# Patient Record
Sex: Female | Born: 1955
Health system: Southern US, Community
[De-identification: ages and names within clinical notes are randomized; demographics above are authoritative.]

## PROBLEM LIST (undated history)

## (undated) DIAGNOSIS — Z9109 Other allergy status, other than to drugs and biological substances: Secondary | ICD-10-CM

## (undated) DIAGNOSIS — K219 Gastro-esophageal reflux disease without esophagitis: Secondary | ICD-10-CM

## (undated) DIAGNOSIS — C50919 Malignant neoplasm of unspecified site of unspecified female breast: Secondary | ICD-10-CM

## (undated) DIAGNOSIS — E785 Hyperlipidemia, unspecified: Secondary | ICD-10-CM

## (undated) DIAGNOSIS — M459 Ankylosing spondylitis of unspecified sites in spine: Secondary | ICD-10-CM

## (undated) DIAGNOSIS — I10 Essential (primary) hypertension: Secondary | ICD-10-CM

## (undated) DIAGNOSIS — M1990 Unspecified osteoarthritis, unspecified site: Secondary | ICD-10-CM

## (undated) DIAGNOSIS — G5600 Carpal tunnel syndrome, unspecified upper limb: Secondary | ICD-10-CM

## (undated) HISTORY — DX: Malignant neoplasm of unspecified site of unspecified female breast: C50.919

## (undated) HISTORY — PX: ABDOMINAL HYSTERECTOMY: SHX81

## (undated) HISTORY — DX: Ankylosing spondylitis of unspecified sites in spine: M45.9

---

## 2001-08-10 ENCOUNTER — Encounter: Payer: Self-pay | Admitting: Internal Medicine

## 2001-08-10 ENCOUNTER — Emergency Department (HOSPITAL_COMMUNITY): Admission: EM | Admit: 2001-08-10 | Discharge: 2001-08-10 | Payer: Self-pay | Admitting: Internal Medicine

## 2002-02-21 ENCOUNTER — Emergency Department (HOSPITAL_COMMUNITY): Admission: EM | Admit: 2002-02-21 | Discharge: 2002-02-21 | Payer: Self-pay | Admitting: *Deleted

## 2007-01-28 ENCOUNTER — Ambulatory Visit (HOSPITAL_COMMUNITY): Admission: RE | Admit: 2007-01-28 | Discharge: 2007-01-28 | Payer: Self-pay | Admitting: Internal Medicine

## 2007-03-05 ENCOUNTER — Ambulatory Visit (HOSPITAL_COMMUNITY): Admission: RE | Admit: 2007-03-05 | Discharge: 2007-03-05 | Payer: Self-pay | Admitting: Emergency Medicine

## 2007-09-01 ENCOUNTER — Encounter (HOSPITAL_COMMUNITY): Admission: RE | Admit: 2007-09-01 | Discharge: 2007-10-01 | Payer: Self-pay | Admitting: Internal Medicine

## 2008-05-18 ENCOUNTER — Ambulatory Visit (HOSPITAL_COMMUNITY): Admission: RE | Admit: 2008-05-18 | Discharge: 2008-05-18 | Payer: Self-pay | Admitting: Internal Medicine

## 2010-03-22 ENCOUNTER — Ambulatory Visit (HOSPITAL_COMMUNITY): Admission: RE | Admit: 2010-03-22 | Discharge: 2010-03-22 | Payer: Self-pay | Admitting: Internal Medicine

## 2010-04-12 ENCOUNTER — Encounter (HOSPITAL_COMMUNITY): Admission: RE | Admit: 2010-04-12 | Discharge: 2010-05-12 | Payer: Self-pay | Admitting: Neurology

## 2010-09-25 ENCOUNTER — Ambulatory Visit (HOSPITAL_COMMUNITY): Admission: RE | Admit: 2010-09-25 | Discharge: 2010-09-25 | Payer: Self-pay | Admitting: Gastroenterology

## 2011-06-25 ENCOUNTER — Other Ambulatory Visit: Payer: Self-pay | Admitting: Obstetrics and Gynecology

## 2011-06-25 DIAGNOSIS — R209 Unspecified disturbances of skin sensation: Secondary | ICD-10-CM

## 2011-06-28 ENCOUNTER — Ambulatory Visit (HOSPITAL_COMMUNITY)
Admission: RE | Admit: 2011-06-28 | Discharge: 2011-06-28 | Disposition: A | Discharge status: Home / Self Care | Payer: Self-pay | Source: Ambulatory Visit | Attending: Obstetrics and Gynecology | Admitting: Obstetrics and Gynecology

## 2011-06-28 DIAGNOSIS — R209 Unspecified disturbances of skin sensation: Secondary | ICD-10-CM | POA: Insufficient documentation

## 2011-06-28 DIAGNOSIS — G9389 Other specified disorders of brain: Secondary | ICD-10-CM | POA: Insufficient documentation

## 2011-11-21 ENCOUNTER — Other Ambulatory Visit (HOSPITAL_COMMUNITY): Payer: Self-pay | Admitting: Physician Assistant

## 2011-11-21 DIAGNOSIS — Z1231 Encounter for screening mammogram for malignant neoplasm of breast: Secondary | ICD-10-CM

## 2011-11-22 ENCOUNTER — Other Ambulatory Visit: Payer: Self-pay | Admitting: Obstetrics and Gynecology

## 2011-11-22 DIAGNOSIS — Z1231 Encounter for screening mammogram for malignant neoplasm of breast: Secondary | ICD-10-CM

## 2011-11-26 ENCOUNTER — Ambulatory Visit (HOSPITAL_COMMUNITY)
Admission: RE | Admit: 2011-11-26 | Discharge: 2011-11-26 | Disposition: A | Discharge status: Home / Self Care | Payer: Self-pay | Source: Ambulatory Visit | Attending: Physician Assistant | Admitting: Physician Assistant

## 2011-11-26 DIAGNOSIS — Z1231 Encounter for screening mammogram for malignant neoplasm of breast: Secondary | ICD-10-CM

## 2012-02-28 ENCOUNTER — Emergency Department (HOSPITAL_COMMUNITY)
Admission: EM | Admit: 2012-02-28 | Discharge: 2012-02-28 | Disposition: A | Discharge status: Home / Self Care | Payer: Self-pay | Attending: Emergency Medicine | Admitting: Emergency Medicine

## 2012-02-28 ENCOUNTER — Emergency Department (HOSPITAL_COMMUNITY): Payer: Self-pay

## 2012-02-28 ENCOUNTER — Encounter (HOSPITAL_COMMUNITY): Payer: Self-pay | Admitting: *Deleted

## 2012-02-28 DIAGNOSIS — M129 Arthropathy, unspecified: Secondary | ICD-10-CM | POA: Insufficient documentation

## 2012-02-28 DIAGNOSIS — M7989 Other specified soft tissue disorders: Secondary | ICD-10-CM | POA: Insufficient documentation

## 2012-02-28 DIAGNOSIS — I10 Essential (primary) hypertension: Secondary | ICD-10-CM | POA: Insufficient documentation

## 2012-02-28 DIAGNOSIS — E785 Hyperlipidemia, unspecified: Secondary | ICD-10-CM | POA: Insufficient documentation

## 2012-02-28 DIAGNOSIS — R600 Localized edema: Secondary | ICD-10-CM

## 2012-02-28 DIAGNOSIS — M25579 Pain in unspecified ankle and joints of unspecified foot: Secondary | ICD-10-CM | POA: Insufficient documentation

## 2012-02-28 HISTORY — DX: Unspecified osteoarthritis, unspecified site: M19.90

## 2012-02-28 HISTORY — DX: Hyperlipidemia, unspecified: E78.5

## 2012-02-28 HISTORY — DX: Other allergy status, other than to drugs and biological substances: Z91.09

## 2012-02-28 HISTORY — DX: Essential (primary) hypertension: I10

## 2012-02-28 LAB — URINE MICROSCOPIC-ADD ON

## 2012-02-28 LAB — BASIC METABOLIC PANEL
BUN: 7 mg/dL (ref 6–23)
CO2: 33 mEq/L — ABNORMAL HIGH (ref 19–32)
Calcium: 10 mg/dL (ref 8.4–10.5)
Chloride: 103 mEq/L (ref 96–112)
Creatinine, Ser: 0.67 mg/dL (ref 0.50–1.10)
GFR calc Af Amer: >90 mL/min (ref >90)
GFR calc non Af Amer: >90 mL/min (ref >90)
Glucose, Bld: 109 mg/dL — ABNORMAL HIGH (ref 70–99)
Potassium: 3.2 mEq/L — ABNORMAL LOW (ref 3.5–5.1)
Sodium: 142 mEq/L (ref 135–145)

## 2012-02-28 LAB — URINALYSIS, ROUTINE W REFLEX MICROSCOPIC
Bilirubin Urine: NEGATIVE
Glucose, UA: NEGATIVE mg/dL
Hgb urine dipstick: NEGATIVE
Ketones, ur: NEGATIVE mg/dL
Nitrite: NEGATIVE
Protein, ur: NEGATIVE mg/dL
Specific Gravity, Urine: <1.005 — ABNORMAL LOW (ref 1.005–1.030)
Urobilinogen, UA: 0.2 mg/dL (ref 0.0–1.0)
pH: 5.5 (ref 5.0–8.0)

## 2012-02-28 NOTE — Discharge Instructions (Signed)
Peripheral Edema You have swelling in your legs (peripheral edema). This swelling is due to excess accumulation of salt and water in your body. Edema may be a sign of heart, kidney or liver disease, or a side effect of a medication. It may also be due to problems in the leg veins. Elevating your legs and using special support stockings may be very helpful, if the cause of the swelling is due to poor venous circulation. Avoid long periods of standing, whatever the cause. Treatment of edema depends on identifying the cause. Chips, pretzels, pickles and other salty foods should be avoided. Restricting salt in your diet is almost always needed. Water pills (diuretics) are often used to remove the excess salt and water from your body via urine. These medicines prevent the kidney from reabsorbing sodium. This increases urine flow. Diuretic treatment may also result in lowering of potassium levels in your body. Potassium supplements may be needed if you have to use diuretics daily. Daily weights can help you keep track of your progress in clearing your edema. You should call your caregiver for follow up care as recommended. SEEK IMMEDIATE MEDICAL CARE IF:   You have increased swelling, pain, redness, or heat in your legs.   You develop shortness of breath, especially when lying down.   You develop chest or abdominal pain, weakness, or fainting.   You have a fever.  Document Released: 12/20/2004 Document Revised: 11/01/2011 Document Reviewed: 11/30/2009 [employer] Patient Information 2012 [employer]. 

## 2012-02-28 NOTE — ED Provider Notes (Signed)
History   This chart was scribed for Felisa Bonier, MD by Sofie Rower. The patient was seen in room APA01/APA01 and the patient's care was started at 4:09 PM     CSN: [id number]  Arrival date & time 02/28/12  1441   First MD Initiated Contact with Patient 02/28/12 1517      Chief Complaint  Patient presents with  . Leg Swelling    (Consider location/radiation/quality/duration/timing/severity/associated sxs/prior treatment) HPI  Kimberly Roman is a 56 y.o. female who presents to the Emergency Department complaining of moderate, episodic edema located bilaterally at the ankles  onset two weeks ago without significant associated symptoms of ankle pain. No history of trauma. Modifying factors include accuretic (ace inhibitor and hydrochlorothiazide) for which the pt was recently prescribed and increased in dose. Pt has a hx of arthritis in the spine but no other known arthritis.  Pt denies hx of edema of the ankles, gout.      Past Medical History  Diagnosis Date  . Hypertension   . Hyperlipemia   . Environmental allergies   . Arthritis     Past Surgical History  Procedure Date  . Abdominal hysterectomy       History  Substance Use Topics  . Smoking status: Never Smoker   . Smokeless tobacco: Not on file  . Alcohol Use: No    OB History    Grav Para Term Preterm Abortions TAB SAB Ect Mult Living                  Review of Systems  All other systems reviewed and are negative.    10 Systems reviewed and all are negative for acute change except as noted in the HPI.    Allergies  Lipitor  Home Medications   Current Outpatient Rx  Name Route Sig Dispense Refill  . AMLODIPINE BESYLATE 10 MG PO TABS Oral Take 10 mg by mouth at bedtime.    Marland Kitchen VITAMIN D 1000 UNITS PO TABS Oral Take 1,000 Units by mouth 2 (two) times daily.    . COLESEVELAM HCL 625 MG PO TABS Oral Take 1,875 mg by mouth 2 (two) times daily.    . DULOXETINE HCL 60 MG PO CPEP Oral Take 60  mg by mouth at bedtime.    . OMEGA-3 FATTY ACIDS 1000 MG PO CAPS Oral Take 1 g by mouth 3 (three) times daily.    Marland Kitchen GABAPENTIN 600 MG PO TABS Oral Take 600 mg by mouth 3 (three) times daily.    Marland Kitchen LORATADINE 10 MG PO TABS Oral Take 10 mg by mouth at bedtime.    Marland Kitchen MELATONIN 3 MG PO CAPS Oral Take 1 capsule by mouth at bedtime.    . QUINAPRIL-HYDROCHLOROTHIAZIDE 20-12.5 MG PO TABS Oral Take 1 tablet by mouth daily.      BP 146/72  Pulse 87  Temp(Src) 98 F (36.7 C) (Oral)  Ht 5\' 5"  (1.651 m)  Wt 170 lb (77.111 kg)  BMI 28.29 kg/m2  SpO2 98%  Physical Exam  Nursing note and vitals reviewed. Constitutional: She is oriented to person, place, and time. She appears well-developed and well-nourished.  HENT:  Head: Normocephalic and atraumatic.  Eyes: Conjunctivae and EOM are normal. Pupils are equal, round, and reactive to light. No scleral icterus.  Neck: Neck supple. No JVD present. No thyromegaly present.  Cardiovascular: Normal rate and regular rhythm.  Exam reveals no gallop and no friction rub.   No murmur heard. Pulmonary/Chest: Breath  sounds normal. No stridor. She has no wheezes. She has no rales. She exhibits no tenderness.       No respiratory distress, or shortness of breath reported.   Abdominal: She exhibits no distension. There is no tenderness. There is no rebound.  Musculoskeletal: Normal range of motion. She exhibits edema (Bilaterally at the ankles, medial maleousus on each side.). She exhibits no tenderness.       Normal flexion and strength, 5/5, intact extension of the great toes.   Lymphadenopathy:    She has no cervical adenopathy.  Neurological: She is alert and oriented to person, place, and time. No cranial nerve deficit. Coordination normal.  Skin: Skin is warm and dry. No rash noted. No erythema (mild. ).       Bogginess bilaterally over the left malleolus without tenderness or bruising.   Psychiatric: She has a normal mood and affect. Her behavior is normal.      ED Course  Procedures (including critical care time)  DIAGNOSTIC STUDIES: Oxygen Saturation is 98% on room air, normal by my interpretation.    COORDINATION OF CARE:     Labs Reviewed  BASIC METABOLIC PANEL - Abnormal; Notable for the following:    Potassium 3.2 (*)    CO2 33 (*)    Glucose, Bld 109 (*)    All other components within normal limits  URINALYSIS, ROUTINE W REFLEX MICROSCOPIC   Dg Ankle Complete Left  02/28/2012  *RADIOLOGY REPORT*  Clinical Data: Swelling at lateral malleolus  LEFT ANKLE COMPLETE - 3+ VIEW  Comparison: None  Findings: Mild diffuse soft tissue swelling.  No fracture or subluxation.  No significant arthropathy or other bony abnormality.  Small plantar and posterior calcaneal heel spurs are noted.  IMPRESSION:  1. Soft tissue swelling. 2.  Heel spurs.  Original Report Authenticated By: Rosealee Albee, M.D.   Dg Ankle Complete Right  02/28/2012  *RADIOLOGY REPORT*  Clinical Data: Bilateral ankle pain and swelling  RIGHT ANKLE - COMPLETE 3+ VIEW  Comparison: None  Findings: Mild diffuse soft tissue swelling is identified.  There is no fracture or subluxation identified.  Small posterior and plantar heel spurs identified.  No radiopaque foreign bodies or soft tissue calcifications.  IMPRESSION:  1.  Mild soft tissue swelling. 2.  Heel spurs.  Original Report Authenticated By: Rosealee Albee, M.D.     No diagnosis found.  4:16PM- EDP at bedside discusses treatment plan.   MDM  Considerations include acute renal failure, nephrotic syndrome, venous insufficiency, but the patient does not appear to have deep venous thrombosis, cellulitis, fracture, acute or chronic arthritis of the ankles, or congestive heart failure based on history, physical examination, and review of today's x-rays. Likely, the patient is has asymptomatic edema of the lower extremities but will need to be treated with compression stockings and elevation, and followup with her primary  prescribing physician for further evaluation and management. No apparent acute emergency cause of edema.      I personally performed the services described in this documentation, which was scribed in my presence. The recorded information has been reviewed and considered.   Felisa Bonier, MD 02/28/12 [phone]

## 2012-02-28 NOTE — ED Notes (Signed)
Bil swelling of feet and ankles, No injury.

## 2012-02-28 NOTE — ED Notes (Signed)
Swelling to bilateral ankles, worse on the right, throbbing pain, pt states that she has had this problem since march has been seen by the "clinic" with no diagnosis. Pt states that the pain has become "too much" for her, denies any injury. Pt states that the swelling states around both ankles.

## 2012-08-19 ENCOUNTER — Emergency Department (HOSPITAL_COMMUNITY): Payer: Self-pay

## 2012-08-19 ENCOUNTER — Emergency Department (HOSPITAL_COMMUNITY)
Admission: EM | Admit: 2012-08-19 | Discharge: 2012-08-19 | Disposition: A | Discharge status: Home / Self Care | Payer: Self-pay | Attending: Emergency Medicine | Admitting: Emergency Medicine

## 2012-08-19 ENCOUNTER — Encounter (HOSPITAL_COMMUNITY): Payer: Self-pay

## 2012-08-19 DIAGNOSIS — X58XXXA Exposure to other specified factors, initial encounter: Secondary | ICD-10-CM | POA: Insufficient documentation

## 2012-08-19 DIAGNOSIS — S8010XA Contusion of unspecified lower leg, initial encounter: Secondary | ICD-10-CM | POA: Insufficient documentation

## 2012-08-19 DIAGNOSIS — Z888 Allergy status to other drugs, medicaments and biological substances status: Secondary | ICD-10-CM | POA: Insufficient documentation

## 2012-08-19 DIAGNOSIS — I10 Essential (primary) hypertension: Secondary | ICD-10-CM | POA: Insufficient documentation

## 2012-08-19 NOTE — ED Provider Notes (Signed)
History     CSN: [id number]  Arrival date & time 08/19/12  1601   First MD Initiated Contact with Patient 08/19/12 1648      Chief Complaint  Patient presents with  . Leg Pain    (Consider location/radiation/quality/duration/timing/severity/associated sxs/prior treatment) Patient is a 56 y.o. female presenting with leg pain. The history is provided by the patient.  Leg Pain  Incident onset: Pt noticeed raised area of the upper "shin" 2 days ago. No sure of the time of the injury. The incident occurred at work. The injury mechanism is unknown. The pain is present in the left leg. The quality of the pain is described as aching. The pain is moderate. The pain has been fluctuating since onset. Pertinent negatives include no numbness, no inability to bear weight and no loss of sensation. She reports no foreign bodies present. The symptoms are aggravated by bearing weight.    Past Medical History  Diagnosis Date  . Hypertension   . Hyperlipemia   . Environmental allergies   . Arthritis     Past Surgical History  Procedure Date  . Abdominal hysterectomy     No family history on file.  History  Substance Use Topics  . Smoking status: Never Smoker   . Smokeless tobacco: Not on file  . Alcohol Use: No    OB History    Grav Para Term Preterm Abortions TAB SAB Ect Mult Living                  Review of Systems  Constitutional: Negative for activity change.       All ROS Neg except as noted in HPI  HENT: Positive for sneezing. Negative for nosebleeds and neck pain.   Eyes: Negative for photophobia and discharge.  Respiratory: Negative for cough, shortness of breath and wheezing.   Cardiovascular: Negative for chest pain and palpitations.  Gastrointestinal: Negative for abdominal pain and blood in stool.  Genitourinary: Negative for dysuria, frequency and hematuria.  Musculoskeletal: Positive for arthralgias. Negative for back pain.  Skin: Negative.   Neurological:  Negative for dizziness, seizures, speech difficulty and numbness.  Psychiatric/Behavioral: Negative for hallucinations and confusion.    Allergies  Lipitor  Home Medications   Current Outpatient Rx  Name Route Sig Dispense Refill  . AMLODIPINE BESYLATE 10 MG PO TABS Oral Take 10 mg by mouth at bedtime.    . CELECOXIB 200 MG PO CAPS Oral Take 200 mg by mouth daily.    Marland Kitchen VITAMIN D 1000 UNITS PO TABS Oral Take 1,000 Units by mouth daily.     . COLESEVELAM HCL 625 MG PO TABS Oral Take 1,250 mg by mouth 2 (two) times daily.     . DULOXETINE HCL 60 MG PO CPEP Oral Take 60 mg by mouth at bedtime.    . OMEGA-3 FATTY ACIDS 1000 MG PO CAPS Oral Take 1 g by mouth 3 (three) times daily.    Marland Kitchen GABAPENTIN 600 MG PO TABS Oral Take 600 mg by mouth 3 (three) times daily.    Marland Kitchen MELATONIN 3 MG PO CAPS Oral Take 1 capsule by mouth at bedtime.      BP 131/69  Pulse 95  Temp 98.1 F (36.7 C) (Oral)  Resp 20  Ht 5\' 5"  (1.651 m)  Wt 163 lb (73.936 kg)  BMI 27.12 kg/m2  SpO2 97%  Physical Exam  Nursing note and vitals reviewed. Constitutional: She is oriented to person, place, and time. She appears well-developed and  well-nourished.  Non-toxic appearance.  HENT:  Head: Normocephalic.  Right Ear: Tympanic membrane and external ear normal.  Left Ear: Tympanic membrane and external ear normal.  Eyes: EOM and lids are normal. Pupils are equal, round, and reactive to light.  Neck: Normal range of motion. Neck supple. Carotid bruit is not present.  Cardiovascular: Normal rate, regular rhythm, normal heart sounds, intact distal pulses and normal pulses.   Pulmonary/Chest: Breath sounds normal. No respiratory distress.  Abdominal: Soft. Bowel sounds are normal. There is no tenderness. There is no guarding.  Musculoskeletal: Normal range of motion.       Quarter size hematoma over the upper left tibia. Mild to mod soreness. Not hot. No red streaking. No posterior mass. Neg Homan's sign. DP and PT pulse and  sensory symmetrical.  Lymphadenopathy:       Head (right side): No submandibular adenopathy present.       Head (left side): No submandibular adenopathy present.    She has no cervical adenopathy.  Neurological: She is alert and oriented to person, place, and time. She has normal strength. No cranial nerve deficit or sensory deficit.  Skin: Skin is warm and dry.  Psychiatric: She has a normal mood and affect. Her speech is normal.    ED Course  Procedures (including critical care time)  Labs Reviewed - No data to display No results found. Pulse Ox 97% on RA. WNL by my interpretation.  No diagnosis found.    MDM  I have reviewed nursing notes, vital signs, and all appropriate lab and imaging results for this patient. Xray of the left Tib/Fib is negative for fracture or fb. Exam is consistent with anterior tibial hematoma. Pt re-assured with results. She is to return if any changes or problem.       Kathie Dike, [place] 08/19/12 [phone]

## 2012-08-19 NOTE — ED Notes (Signed)
Pt has a swollen area over upper tibia, present since Sunday. No trauma known.  Area hurts when walks. No discoloration. Area was bigger at onset.

## 2012-08-19 NOTE — ED Notes (Signed)
Pt reports has tender raised knot on left leg beside shin.  Pt says doesn't remember injuring her leg.

## 2012-08-20 NOTE — ED Provider Notes (Signed)
Medical screening examination/treatment/procedure(s) were performed by non-physician practitioner and as supervising physician I was immediately available for consultation/collaboration.  Donnetta Hutching, MD 08/20/12 [phone]

## 2012-12-31 ENCOUNTER — Other Ambulatory Visit (HOSPITAL_COMMUNITY): Payer: Self-pay | Admitting: Internal Medicine

## 2012-12-31 DIAGNOSIS — Z139 Encounter for screening, unspecified: Secondary | ICD-10-CM

## 2013-01-06 ENCOUNTER — Other Ambulatory Visit: Payer: Self-pay

## 2013-01-06 ENCOUNTER — Telehealth: Payer: Self-pay

## 2013-01-06 DIAGNOSIS — Z1211 Encounter for screening for malignant neoplasm of colon: Secondary | ICD-10-CM

## 2013-01-06 MED ORDER — PEG-KCL-NACL-NASULF-NA ASC-C 100 G PO SOLR: 1.0000 | ORAL | Status: DC

## 2013-01-06 NOTE — Telephone Encounter (Signed)
Rx sent to [facility]. Instructions mailed to pt.  

## 2013-01-06 NOTE — Telephone Encounter (Signed)
Gastroenterology Pre-Procedure Form     Request Date: 01/06/2013                     Requesting Physician: Dr. Felecia Shelling     PATIENT INFORMATION:  Kimberly Roman is a 57 y.o., female (DOB=[birth year]).  PROCEDURE: Procedure(s) requested: colonoscopy Procedure Reason: screening for colon cancer  PATIENT REVIEW QUESTIONS: The patient reports the following:   1. Diabetes Melitis: no 2. Joint replacements in the past 12 months: no 3. Major health problems in the past 3 months: no 4. Has an artificial valve or MVP:no 5. Has been advised in past to take antibiotics in advance of a procedure like teeth cleaning: no}    MEDICATIONS & ALLERGIES:    Patient reports the following regarding taking any blood thinners:   Plavix? no Aspirin?no Coumadin?  no  Patient confirms/reports the following medications:  Current Outpatient Prescriptions  Medication Sig Dispense Refill  . amLODipine (NORVASC) 10 MG tablet Take 10 mg by mouth at bedtime.      . celecoxib (CELEBREX) 200 MG capsule Take 200 mg by mouth daily.      . cholecalciferol (VITAMIN D) 1000 UNITS tablet Take 1,000 Units by mouth daily.       . colesevelam (WELCHOL) 625 MG tablet Take 1,250 mg by mouth 2 (two) times daily.       . DULoxetine (CYMBALTA) 60 MG capsule Take 60 mg by mouth at bedtime.      . fish oil-omega-3 fatty acids 1000 MG capsule Take 1 g by mouth 3 (three) times daily.      Marland Kitchen gabapentin (NEURONTIN) 600 MG tablet Take 600 mg by mouth 3 (three) times daily.      . Melatonin 3 MG CAPS Take 1 capsule by mouth at bedtime.       No current facility-administered medications for this visit.    Patient confirms/reports the following allergies:  Allergies  Allergen Reactions  . Lipitor (Atorvastatin Calcium) Other (See Comments)    REACTION: Cramping in legs/lower extremties    Patient is appropriate to schedule for requested procedure(s): yes  AUTHORIZATION INFORMATION Primary Insurance:   ID #:   Group #:   Pre-Cert / Auth required: Pre-Cert / Auth #:   Secondary Insurance:   ID #:  Group #:  Pre-Cert / Auth required:  Pre-Cert / Auth #:   No orders of the defined types were placed in this encounter.    SCHEDULE INFORMATION: Procedure has been scheduled as follows:  Date: 01/20/2013     Time: 9:30 AM  Location: [facility] Short Stay  This Gastroenterology Pre-Precedure Form is being routed to the following provider(s) for review: Jonette Eva, MD

## 2013-01-06 NOTE — Telephone Encounter (Signed)
MOVI PREP SPLIT DOSING, REGULAR BREAKFAST. CLEAR LIQUIDS AFTER 9 AM.  

## 2013-01-08 ENCOUNTER — Ambulatory Visit (HOSPITAL_COMMUNITY): Payer: Self-pay

## 2013-01-12 ENCOUNTER — Encounter (HOSPITAL_COMMUNITY): Payer: Self-pay | Admitting: Pharmacy Technician

## 2013-01-19 MED ORDER — SODIUM CHLORIDE 0.45 % IV SOLN
INTRAVENOUS | Status: DC
  Administered 2013-01-20: 10:00:00 via INTRAVENOUS

## 2013-01-20 ENCOUNTER — Ambulatory Visit (HOSPITAL_COMMUNITY)
Admission: RE | Admit: 2013-01-20 | Discharge: 2013-01-20 | Disposition: A | Discharge status: Home / Self Care | Payer: BC Managed Care – PPO | Source: Ambulatory Visit | Attending: Gastroenterology | Admitting: Gastroenterology

## 2013-01-20 ENCOUNTER — Encounter (HOSPITAL_COMMUNITY)
Admission: RE | Disposition: A | Discharge status: Home / Self Care | Payer: Self-pay | Source: Ambulatory Visit | Attending: Gastroenterology

## 2013-01-20 ENCOUNTER — Encounter (HOSPITAL_COMMUNITY): Payer: Self-pay | Admitting: *Deleted

## 2013-01-20 DIAGNOSIS — D126 Benign neoplasm of colon, unspecified: Secondary | ICD-10-CM

## 2013-01-20 DIAGNOSIS — D128 Benign neoplasm of rectum: Secondary | ICD-10-CM | POA: Insufficient documentation

## 2013-01-20 DIAGNOSIS — K5730 Diverticulosis of large intestine without perforation or abscess without bleeding: Secondary | ICD-10-CM

## 2013-01-20 DIAGNOSIS — I10 Essential (primary) hypertension: Secondary | ICD-10-CM | POA: Insufficient documentation

## 2013-01-20 DIAGNOSIS — Z1211 Encounter for screening for malignant neoplasm of colon: Secondary | ICD-10-CM

## 2013-01-20 DIAGNOSIS — D129 Benign neoplasm of anus and anal canal: Secondary | ICD-10-CM | POA: Insufficient documentation

## 2013-01-20 HISTORY — DX: Carpal tunnel syndrome, unspecified upper limb: G56.00

## 2013-01-20 HISTORY — PX: COLONOSCOPY: SHX5424

## 2013-01-20 SURGERY — COLONOSCOPY
Anesthesia: Moderate Sedation

## 2013-01-20 MED ORDER — MIDAZOLAM HCL 5 MG/5ML IJ SOLN
INTRAMUSCULAR | Status: DC | PRN
  Administered 2013-01-20: 1 mg via INTRAVENOUS
  Administered 2013-01-20 (×2): 2 mg via INTRAVENOUS

## 2013-01-20 MED ORDER — MEPERIDINE HCL 100 MG/ML IJ SOLN
INTRAMUSCULAR | Status: DC | PRN
  Administered 2013-01-20: 50 mg via INTRAVENOUS
  Administered 2013-01-20: 25 mg via INTRAVENOUS

## 2013-01-20 MED ORDER — MIDAZOLAM HCL 5 MG/5ML IJ SOLN
INTRAMUSCULAR | Status: AC
  Filled 2013-01-20: qty 10

## 2013-01-20 MED ORDER — MEPERIDINE HCL 100 MG/ML IJ SOLN
INTRAMUSCULAR | Status: AC
  Filled 2013-01-20: qty 2

## 2013-01-20 MED ORDER — STERILE WATER FOR IRRIGATION IR SOLN
Status: DC | PRN
  Administered 2013-01-20: 10:00:00

## 2013-01-20 NOTE — Op Note (Signed)
[facility] [address] [place], [address]   COLONOSCOPY PROCEDURE REPORT  PATIENT: Kimberly, Roman  MR#: [id number] BIRTHDATE: 12-20-1955 , 57  yrs. old GENDER: Female ENDOSCOPIST: Jonette Eva, MD REFERRED [id number] Fanta, M.D. PROCEDURE DATE:  01/20/2013 PROCEDURE:   Colonoscopy with cold biopsy polypectomy INDICATIONS:Average risk patient for colon cancer. MEDICATIONS: Demerol 75 mg IV and Versed 5 mg IV  DESCRIPTION OF PROCEDURE:    Physical exam was performed.  Informed consent was obtained from the patient after explaining the benefits, risks, and alternatives to procedure.  The patient was connected to monitor and placed in left lateral position. Continuous oxygen was provided by nasal cannula and IV medicine administered through an indwelling cannula.  After administration of sedation and rectal exam, the patients rectum was intubated and the Pentax Colonoscope [phone]  colonoscope was advanced under direct visualization to the cecum.  The scope was removed slowly by carefully examining the color, texture, anatomy, and integrity mucosa on the way out.  The patient was recovered in endoscopy and discharged home in satisfactory condition.    COLON FINDINGS: Two sessile polyps measuring 3 mm in size were found in the rectum and at the cecum.  A polypectomy was performed with cold forceps.  , Mild diverticulosis was noted in the sigmoid colon. REDUNDANT SIGMOID COLON, Manual abdominal counter-pressure was used to reach the cecum.  PREP QUALITY: good. CECAL W/D TIME: 16 minutes COMPLICATIONS: None  ENDOSCOPIC IMPRESSION: 1.   Two COLON polyps 2.   Mild diverticulosis was noted in the sigmoid colon  RECOMMENDATIONS: AWAIT BIOPSY HIGH FIBER DIET TCS IN 10 YEARS WITH OVERTUBE       _______________________________ eSignedJonette Eva, MD 01/20/2013 12:02 PM     PATIENT NAME:  Kimberly, Roman MR#: [id number]

## 2013-01-20 NOTE — H&P (Signed)
  Primary Care Physician:  Avon Gully, MD Primary Gastroenterologist:  Dr. Darrick Penna  Pre-Procedure History & Physical: HPI:  Kimberly Roman is a 57 y.o. female here for COLON CANCER SCREENING.  Past Medical History  Diagnosis Date  . Hypertension   . Hyperlipemia   . Environmental allergies   . Arthritis   . Carpal tunnel syndrome     Past Surgical History  Procedure Laterality Date  . Abdominal hysterectomy      Prior to Admission medications   Medication Sig Start Date End Date Taking? Authorizing Provider  amLODipine (NORVASC) 10 MG tablet Take 10 mg by mouth at bedtime.   Yes Historical Provider, MD  celecoxib (CELEBREX) 200 MG capsule Take 200 mg by mouth daily.   Yes Historical Provider, MD  cholecalciferol (VITAMIN D) 1000 UNITS tablet Take 1,000 Units by mouth daily.    Yes Historical Provider, MD  colesevelam (WELCHOL) 625 MG tablet Take 1,250 mg by mouth 2 (two) times daily.    Yes Historical Provider, MD  DULoxetine (CYMBALTA) 60 MG capsule Take 60 mg by mouth at bedtime.   Yes Historical Provider, MD  fish oil-omega-3 fatty acids 1000 MG capsule Take 1 g by mouth 3 (three) times daily.   Yes Historical Provider, MD  gabapentin (NEURONTIN) 600 MG tablet Take 600 mg by mouth 3 (three) times daily.   Yes Historical Provider, MD  Melatonin 3 MG CAPS Take 1 capsule by mouth at bedtime.   Yes Historical Provider, MD  peg 3350 powder (MOVIPREP) 100 G SOLR Take 1 kit (100 g total) by mouth as directed. 01/06/13  Yes West Bali, MD    Allergies as of 01/06/2013 - Review Complete 01/06/2013  Allergen Reaction Noted  . Lipitor (atorvastatin calcium) Other (See Comments) 02/28/2012    Family History  Problem Relation Age of Onset  . Colon cancer Neg Hx     History   Social History  . Marital Status: Single    Spouse Name: N/A    Number of Children: N/A  . Years of Education: N/A   Occupational History  . Not on file.   Social History Main Topics  .  Smoking status: Never Smoker   . Smokeless tobacco: Not on file  . Alcohol Use: No  . Drug Use: No  . Sexually Active: Not on file   Other Topics Concern  . Not on file   Social History Narrative  . No narrative on file    Review of Systems: See HPI, otherwise negative ROS   Physical Exam: BP 135/75  Temp(Src) 97.9 F (36.6 C) (Oral)  Resp 16  Ht 5\' 5"  (1.651 m)  Wt 173 lb (78.472 kg)  BMI 28.79 kg/m2  SpO2 93% General:   Alert,  pleasant and cooperative in NAD Head:  Normocephalic and atraumatic. Neck:  Supple; Lungs:  Clear throughout to auscultation.    Heart:  Regular rate and rhythm. Abdomen:  Soft, nontender and nondistended. Normal bowel sounds, without guarding, and without rebound.   Neurologic:  Alert and  oriented x4;  grossly normal neurologically.  Impression/Plan:     SCREENING  Plan:  1. TCS TODAY

## 2013-01-22 ENCOUNTER — Encounter (HOSPITAL_COMMUNITY): Payer: Self-pay | Admitting: Gastroenterology

## 2013-01-22 ENCOUNTER — Telehealth: Payer: Self-pay | Admitting: Gastroenterology

## 2013-01-22 NOTE — Telephone Encounter (Signed)
LMOM to call.

## 2013-01-22 NOTE — Telephone Encounter (Signed)
Path faxed to PCP, recall made 

## 2013-01-22 NOTE — Telephone Encounter (Signed)
Pt returned call and was informed.  

## 2013-01-22 NOTE — Telephone Encounter (Signed)
Please call pt. She had HYPERPLASTIC POLYPS removed from her colon. High fiber diet. TCS in 10 years.

## 2013-02-04 ENCOUNTER — Encounter: Payer: Self-pay | Admitting: Orthopedic Surgery

## 2013-02-04 ENCOUNTER — Ambulatory Visit (INDEPENDENT_AMBULATORY_CARE_PROVIDER_SITE_OTHER): Payer: BC Managed Care – PPO | Admitting: Orthopedic Surgery

## 2013-02-04 VITALS — BP 124/78 | Ht 65.0 in | Wt 166.0 lb

## 2013-02-04 DIAGNOSIS — G5600 Carpal tunnel syndrome, unspecified upper limb: Secondary | ICD-10-CM

## 2013-02-04 DIAGNOSIS — G5603 Carpal tunnel syndrome, bilateral upper limbs: Secondary | ICD-10-CM

## 2013-02-04 NOTE — Patient Instructions (Addendum)
Refer to Dr. Gerilyn Pilgrim for carpal tunnel study

## 2013-02-04 NOTE — Progress Notes (Signed)
Chief Complaint  Patient presents with  . Wrist Pain    Wrist and arm pain Referred by DR. Fanta    BP 124/78  Ht 5\' 5"  (1.651 m)  Wt 166 lb (75.297 kg)  BMI 27.62 kg/m2  The patient reports numbness and tingling in her hands for over 6 months swelling inability to perform fine motor tasks 7/10 sharp dull throbbing stabbing burning pain unrelieved by Neurontin and compression gloves.  She reports having I nerve conduction study but we've called the office of Dr. Gerilyn Pilgrim and there are no reports  Review of systems she denies history of thyroid disease or diabetes  She reports seasonal allergies and no other positive findings.  Past Medical History  Diagnosis Date  . Hypertension   . Hyperlipemia   . Environmental allergies   . Arthritis   . Carpal tunnel syndrome   . Ankylosing spondylitis     BP 124/78  Ht 5\' 5"  (1.651 m)  Wt 166 lb (75.297 kg)  BMI 27.62 kg/m2 General appearance is normal, the patient is alert and oriented x3 with normal mood and affect. Body frame small ectomorphic No obvious gross deformities but she cannot supinate the left forearm normally.  Right-hand and left-hand evaluation no swelling numbness and tingling with decreased sensation in the fingertips and fingers distally from the metacarpophalangeal creases. Full range of motion weak grip strength normal wrist stability skin intact no rash normal radial and ulnar pulses bilaterally with normal perfusion. Again decreased sensation in the hand distally from the metacarpophalangeal joint crease. Tenderness over the carpal tunnel. Crepitance at the first [facility] joint no pain  Impression most likely has carpal tunnel syndrome I would like to see I nerve conduction study. She's not interested in surgery but she has had a reasonable course of nonoperative treatment with gabapentin and wrist compression gloves  Recommend nerve conduction study to confirm carpal tunnel syndrome and then discussed with her Cam  Walker treatment options are

## 2013-02-06 ENCOUNTER — Telehealth: Payer: Self-pay | Admitting: *Deleted

## 2013-02-06 NOTE — Telephone Encounter (Signed)
Called Tiffany at Dr. Letitia Neri office and ask her to refer patient to Dr, Gerilyn Pilgrim for carpal tunnel study d/t patient is medicaid [place]. Faxed her Dr. Mort Sawyers office notes.

## 2013-02-24 ENCOUNTER — Ambulatory Visit (INDEPENDENT_AMBULATORY_CARE_PROVIDER_SITE_OTHER): Payer: BC Managed Care – PPO | Admitting: Orthopedic Surgery

## 2013-02-24 ENCOUNTER — Encounter: Payer: Self-pay | Admitting: Orthopedic Surgery

## 2013-02-24 VITALS — BP 122/78 | Ht 65.0 in | Wt 166.0 lb

## 2013-02-24 DIAGNOSIS — M7710 Lateral epicondylitis, unspecified elbow: Secondary | ICD-10-CM

## 2013-02-24 DIAGNOSIS — G5600 Carpal tunnel syndrome, unspecified upper limb: Secondary | ICD-10-CM

## 2013-02-24 DIAGNOSIS — M7712 Lateral epicondylitis, left elbow: Secondary | ICD-10-CM

## 2013-02-24 DIAGNOSIS — G5601 Carpal tunnel syndrome, right upper limb: Secondary | ICD-10-CM

## 2013-02-24 MED ORDER — VITAMIN B-6 100 MG PO TABS: 100.0000 mg | ORAL_TABLET | Freq: Every day | ORAL | Status: DC

## 2013-02-24 NOTE — Patient Instructions (Addendum)
CARPAL TUNNEL RIGHT   START VITAMIN B6 100 TWICE A DAY   TENNIS ELBOW LEFT  START TENNIS ELBOW EXERCISES,  APPLY ICE 3 X A DAY AND APPLY ASPERCREME 3 X A DAY

## 2013-02-24 NOTE — Progress Notes (Signed)
Patient ID: Kimberly Roman, female   DOB: [birth year], 56 y.o.   MRN: [id number] Chief Complaint  Patient presents with  . Follow-up    NCS results    Secondary complaint the patient now complains of left lateral elbow pain is a new problem  As far as her nerve conduction studies go she has carpal tunnel syndrome moderate on the right. She has not been treated with bracing or vitamin B6 she did take some gabapentin she did not improve. We discussed surgical option she would like to try continued nonoperative treatment she is fit fitted for brace today.  Left elbow pain new problem patient reports pain over the lateral aspect of the left elbow radiating into the left thumb and wrist. This is painful with wrist extension and elbow extension and she has lost some of her elbow extension and rotation. The pain is intermittent moderate to severe no other associated symptoms  Review of systems she reports numbness and tingling-like sensations in the left upper extremity and weakness with power grip activities  Past Medical History  Diagnosis Date  . Hypertension   . Hyperlipemia   . Environmental allergies   . Arthritis   . Carpal tunnel syndrome   . Ankylosing spondylitis      Vital signs are stable as recorded  General appearance is normal  The patient is alert and oriented x3  The patient's mood and affect are normal  Gait assessment: No abnormalities  The cardiovascular exam reveals normal pulses and temperature without edema or  swelling.  The lymphatic system is negative for palpable lymph nodes  The sensory exam positive for decreased sensation in the median nerve distribution of the right hand and altered sensation in the left upper extremity as well There are no pathologic reflexes.     Exam of the left elbow Inspection tenderness over the lateral epicondyle remaining bony prominences nontender no joint effusion Range of motion 25-125 range of motion decreased  supination normal pronation normal wrist extension normal wrist flexion Stability normal there is in valgus stability tests Strength flexion extension power normal Skin intact no rash  No diagnosis found.

## 2013-04-09 ENCOUNTER — Ambulatory Visit (INDEPENDENT_AMBULATORY_CARE_PROVIDER_SITE_OTHER): Payer: BC Managed Care – PPO | Admitting: Orthopedic Surgery

## 2013-04-09 ENCOUNTER — Encounter: Payer: Self-pay | Admitting: Orthopedic Surgery

## 2013-04-09 VITALS — BP 132/80 | Ht 65.0 in | Wt 166.0 lb

## 2013-04-09 DIAGNOSIS — G5601 Carpal tunnel syndrome, right upper limb: Secondary | ICD-10-CM

## 2013-04-09 DIAGNOSIS — G5600 Carpal tunnel syndrome, unspecified upper limb: Secondary | ICD-10-CM

## 2013-04-09 DIAGNOSIS — M7712 Lateral epicondylitis, left elbow: Secondary | ICD-10-CM

## 2013-04-09 DIAGNOSIS — M7710 Lateral epicondylitis, unspecified elbow: Secondary | ICD-10-CM

## 2013-04-09 NOTE — Addendum Note (Signed)
Addended by: Vickki Hearing on: 04/09/2013 04:23 PM   Modules accepted: Orders

## 2013-04-09 NOTE — Progress Notes (Signed)
Patient ID: Kimberly Roman, female   DOB: Sep 14, 1956, 57 y.o.   MRN: [id number] Chief Complaint  Patient presents with  . Follow-up    6 week recheck on CTS and tennis elbow.     The patient has been treated for carpal tunnel syndrome for greater than 6 weeks with vitamin B6 and wrist brace and anti-inflammatories she has not made significant improvement still has significant night pain and numbness and tingling in her right hand she is interested in carpal tunnel release.  Her left tennis elbow still symptomatic despite exercises and bracing ice therapy was also tried without relief she will try an injection today at my suggestion  Review of systems no constitutional symptoms eyes normal ENT normal no chest pain no asthma no weight change no incontinence no other joint swelling skin normal numbness and tingling described no depression denies polyphagia no bleeding tendencies no allergies  BP 132/80  Ht 5\' 5"  (1.651 m)  Wt 166 lb (75.297 kg)  BMI 27.62 kg/m2 General appearance is normal, the patient is alert and oriented x3 with normal mood and affect.  Kimberly's normally with no assistive devices. She has tenderness over the carpal tunnel positive compression test normal range of motion in the hands and wrist joint wrist joint stable weak grip strength skin normal decreased sensation but normal capillary refill and radial and ulnar pulse.  Left elbow tenderness lateral epicondyle with decreased supination normal pronation decreased extension normal flexion elbow joint is stable motor exam is normal scans intact pulses are good sensation is normal  Encounter Diagnoses  Name Primary?  . Carpal tunnel syndrome of right wrist Yes  . Backhand tennis elbow, left     Recommend injection left tennis elbow  She would to go ahead with surgery on the right carpal tunnel  Diagnosis LEFT tennis elbow Posterior diagnosis same Indications pain Anesthesia 2 chloride Procedure  details  Verbal consent was obtained for the procedure.  Time out was taken.  The skin over the LEFT elbow was prepped with alcohol.  A 25-gauge needle was advanced at the point of maximal tenderness and 3 mL of 1% lidocaine and 1 mL of 40 mg Depo-Medrol per cc was injected.  The procedure was tolerated well  Complications none.

## 2013-04-09 NOTE — Patient Instructions (Signed)
You have received a steroid shot. 15% of patients experience increased pain at the injection site with in the next 24 hours. This is best treated with ice and tylenol extra strength 2 tabs every 8 hours. If you are still having pain please call the office.    Surgery CTR right   Carpal Tunnel Release Carpal tunnel release is done to relieve the pressure on the nerves and tendons on the bottom side of your wrist.  LET YOUR CAREGIVER KNOW ABOUT:   Allergies to food or medicine.  Medicines taken, including vitamins, herbs, eyedrops, over-the-counter medicines, and creams.  Use of steroids (by mouth or creams).  Previous problems with anesthetics or numbing medicines.  History of bleeding problems or blood clots.  Previous surgery.  Other health problems, including diabetes and kidney problems.  Possibility of pregnancy, if this applies. RISKS AND COMPLICATIONS  Some problems that may happen after this procedure include:  Infection.  Damage to the nerves, arteries or tendons could occur. This would be very uncommon.  Bleeding. BEFORE THE PROCEDURE   This surgery may be done while you are asleep (general anesthetic) or may be done under a block where only your forearm and the surgical area is numb.  If the surgery is done under a block, the numbness will gradually wear off within several hours after surgery. HOME CARE INSTRUCTIONS   Have a responsible person with you for 24 hours.  Do not drive a car or use public transportation for 24 hours.  Only take over-the-counter or prescription medicines for pain, discomfort, or fever as directed by your caregiver. Take them as directed.  You may put ice on the palm side of the affected wrist.  Put ice in a plastic bag.  Place a towel between your skin and the bag.  Leave the ice on for 20 to 30 minutes, 4 times per day.  If you were given a splint to keep your wrist from bending, use it as directed. It is important to wear the  splint at night or as directed. Use the splint for as long as you have pain or numbness in your hand, arm, or wrist. This may take 1 to 2 months.  Keep your hand raised (elevated) above the level of your heart as much as possible. This keeps swelling down and helps with discomfort.  Change bandages (dressings) as directed.  Keep the wound clean and dry. SEEK MEDICAL CARE IF:   You develop pain not relieved with medications.  You develop numbness of your hand.  You develop bleeding from your surgical site.  You have an oral temperature above 102 F (38.9 C).  You develop redness or swelling of the surgical site.  You develop new, unexplained problems. SEEK IMMEDIATE MEDICAL CARE IF:   You develop a rash.  You have difficulty breathing.  You develop any reaction or side effects to medications given. Document Released: 02/02/2004 Document Revised: 02/04/2012 Document Reviewed: 09/18/2007 [facility] Patient Information 2013 [place], [place].

## 2013-04-13 ENCOUNTER — Other Ambulatory Visit (HOSPITAL_COMMUNITY): Payer: Self-pay | Admitting: Internal Medicine

## 2013-04-13 ENCOUNTER — Ambulatory Visit (HOSPITAL_COMMUNITY)
Admission: RE | Admit: 2013-04-13 | Discharge: 2013-04-13 | Disposition: A | Discharge status: Home / Self Care | Payer: BC Managed Care – PPO | Source: Ambulatory Visit | Attending: Internal Medicine | Admitting: Internal Medicine

## 2013-04-13 DIAGNOSIS — M25569 Pain in unspecified knee: Secondary | ICD-10-CM | POA: Insufficient documentation

## 2013-04-13 DIAGNOSIS — M25562 Pain in left knee: Secondary | ICD-10-CM

## 2013-04-14 ENCOUNTER — Encounter (HOSPITAL_COMMUNITY): Payer: Self-pay | Admitting: Pharmacy Technician

## 2013-04-21 ENCOUNTER — Encounter (HOSPITAL_COMMUNITY)
Admission: RE | Admit: 2013-04-21 | Discharge: 2013-04-21 | Disposition: A | Discharge status: Home / Self Care | Payer: BC Managed Care – PPO | Source: Ambulatory Visit

## 2013-04-21 ENCOUNTER — Encounter: Payer: Self-pay | Admitting: Orthopedic Surgery

## 2013-04-21 NOTE — Patient Instructions (Addendum)
Kimberly Roman  04/21/2013   Your procedure is scheduled on:   04/27/2013  Report to [facility] at  615  AM.  Call this number if you have problems the morning of surgery: [phone]   Remember:   Do not eat food or drink liquids after midnight.   Take these medicines the morning of surgery with A SIP OF WATER:  Amlodipine,gabapentin   Do not wear jewelry, make-up or nail polish.  Do not wear lotions, powders, or perfumes.   Do not shave 48 hours prior to surgery. Men may shave face and neck.  Do not bring valuables to the hospital.  Contacts, dentures or bridgework may not be worn into surgery.  Leave suitcase in the car. After surgery it may be brought to your room.  For patients admitted to the hospital, checkout time is 11:00 AM the day of discharge.   Patients discharged the day of surgery will not be allowed to drive home.  Name and phone number of your driver: family  Special Instructions: Shower using CHG 2 nights before surgery and the night before surgery.  If you shower the day of surgery use CHG.  Use special wash - you have one bottle of CHG for all showers.  You should use approximately 1/3 of the bottle for each shower.   Please read over the following fact sheets that you were given: Pain Booklet, Coughing and Deep Breathing, MRSA Information, Surgical Site Infection Prevention, Anesthesia Post-op Instructions and Care and Recovery After Surgery Carpal Tunnel Release Carpal tunnel release is done to relieve the pressure on the nerves and tendons on the bottom side of your wrist.  LET YOUR CAREGIVER KNOW ABOUT:   Allergies to food or medicine.  Medicines taken, including vitamins, herbs, eyedrops, over-the-counter medicines, and creams.  Use of steroids (by mouth or creams).  Previous problems with anesthetics or numbing medicines.  History of bleeding problems or blood clots.  Previous surgery.  Other health problems, including diabetes and kidney  problems.  Possibility of pregnancy, if this applies. RISKS AND COMPLICATIONS  Some problems that may happen after this procedure include:  Infection.  Damage to the nerves, arteries or tendons could occur. This would be very uncommon.  Bleeding. BEFORE THE PROCEDURE   This surgery may be done while you are asleep (general anesthetic) or may be done under a block where only your forearm and the surgical area is numb.  If the surgery is done under a block, the numbness will gradually wear off within several hours after surgery. HOME CARE INSTRUCTIONS   Have a responsible person with you for 24 hours.  Do not drive a car or use public transportation for 24 hours.  Only take over-the-counter or prescription medicines for pain, discomfort, or fever as directed by your caregiver. Take them as directed.  You may put ice on the palm side of the affected wrist.  Put ice in a plastic bag.  Place a towel between your skin and the bag.  Leave the ice on for 20 to 30 minutes, 4 times per day.  If you were given a splint to keep your wrist from bending, use it as directed. It is important to wear the splint at night or as directed. Use the splint for as long as you have pain or numbness in your hand, arm, or wrist. This may take 1 to 2 months.  Keep your hand raised (elevated) above the level of your heart  as much as possible. This keeps swelling down and helps with discomfort.  Change bandages (dressings) as directed.  Keep the wound clean and dry. SEEK MEDICAL CARE IF:   You develop pain not relieved with medications.  You develop numbness of your hand.  You develop bleeding from your surgical site.  You have an oral temperature above 102 F (38.9 C).  You develop redness or swelling of the surgical site.  You develop new, unexplained problems. SEEK IMMEDIATE MEDICAL CARE IF:   You develop a rash.  You have difficulty breathing.  You develop any reaction or side  effects to medications given. Document Released: 02/02/2004 Document Revised: 02/04/2012 Document Reviewed: 09/18/2007 [facility] Patient Information 2014 [place], [place]. PATIENT INSTRUCTIONS POST-ANESTHESIA  IMMEDIATELY FOLLOWING SURGERY:  Do not drive or operate machinery for the first twenty four hours after surgery.  Do not make any important decisions for twenty four hours after surgery or while taking narcotic pain medications or sedatives.  If you develop intractable nausea and vomiting or a severe headache please notify your doctor immediately.  FOLLOW-UP:  Please make an appointment with your surgeon as instructed. You do not need to follow up with anesthesia unless specifically instructed to do so.  WOUND CARE INSTRUCTIONS (if applicable):  Keep a dry clean dressing on the anesthesia/puncture wound site if there is drainage.  Once the wound has quit draining you may leave it open to air.  Generally you should leave the bandage intact for twenty four hours unless there is drainage.  If the epidural site drains for more than 36-48 hours please call the anesthesia department.  QUESTIONS?:  Please feel free to call your physician or the hospital operator if you have any questions, and they will be happy to assist you.

## 2013-04-22 ENCOUNTER — Telehealth: Payer: Self-pay | Admitting: Orthopedic Surgery

## 2013-04-22 NOTE — Patient Instructions (Addendum)
Kimberly Roman  04/22/2013   Your procedure is scheduled on:  04/27/2013  Report to [facility] at  615  AM.  Call this number if you have problems the morning of surgery: [phone]   Remember:   Do not eat food or drink liquids after midnight.   Take these medicines the morning of surgery with A SIP OF WATER: norvasc,neurontin,hudrodiuril,mobic,ultram   Do not wear jewelry, make-up or nail polish.  Do not wear lotions, powders, or perfumes.   Do not shave 48 hours prior to surgery. Men may shave face and neck.  Do not bring valuables to the hospital.  Contacts, dentures or bridgework may not be worn into surgery.  Leave suitcase in the car. After surgery it may be brought to your room.  For patients admitted to the hospital, checkout time is 11:00 AM the day of discharge.   Patients discharged the day of surgery will not be allowed to drive  home.  Name and phone number of your driver: family  Special Instructions: Shower using CHG 2 nights before surgery and the night before surgery.  If you shower the day of surgery use CHG.  Use special wash - you have one bottle of CHG for all showers.  You should use approximately 1/3 of the bottle for each shower.   Please read over the following fact sheets that you were given: Pain Booklet, Coughing and Deep Breathing, MRSA Information, Surgical Site Infection Prevention, Anesthesia Post-op Instructions and Care and Recovery After Surgery Carpal Tunnel Release Carpal tunnel release is done to relieve the pressure on the nerves and tendons on the bottom side of your wrist.  LET YOUR CAREGIVER KNOW ABOUT:   Allergies to food or medicine.  Medicines taken, including vitamins, herbs, eyedrops, over-the-counter medicines, and creams.  Use of steroids (by mouth or creams).  Previous problems with anesthetics or numbing medicines.  History of bleeding problems or blood clots.  Previous surgery.  Other health problems, including  diabetes and kidney problems.  Possibility of pregnancy, if this applies. RISKS AND COMPLICATIONS  Some problems that may happen after this procedure include:  Infection.  Damage to the nerves, arteries or tendons could occur. This would be very uncommon.  Bleeding. BEFORE THE PROCEDURE   This surgery may be done while you are asleep (general anesthetic) or may be done under a block where only your forearm and the surgical area is numb.  If the surgery is done under a block, the numbness will gradually wear off within several hours after surgery. HOME CARE INSTRUCTIONS   Have a responsible person with you for 24 hours.  Do not drive a car or use public transportation for 24 hours.  Only take over-the-counter or prescription medicines for pain, discomfort, or fever as directed by your caregiver. Take them as directed.  You may put ice on the palm side of the affected wrist.  Put ice in a plastic bag.  Place a towel between your skin and the bag.  Leave the ice on for 20 to 30 minutes, 4 times per day.  If you were given a splint to keep your wrist from bending, use it as directed. It is important to wear the splint at night or as directed. Use the splint for as long as you have pain or numbness in your hand, arm, or wrist. This may take 1 to 2 months.  Keep your hand raised (elevated) above the level of your heart as  much as possible. This keeps swelling down and helps with discomfort.  Change bandages (dressings) as directed.  Keep the wound clean and dry. SEEK MEDICAL CARE IF:   You develop pain not relieved with medications.  You develop numbness of your hand.  You develop bleeding from your surgical site.  You have an oral temperature above 102 F (38.9 C).  You develop redness or swelling of the surgical site.  You develop new, unexplained problems. SEEK IMMEDIATE MEDICAL CARE IF:   You develop a rash.  You have difficulty breathing.  You develop any  reaction or side effects to medications given. Document Released: 02/02/2004 Document Revised: 02/04/2012 Document Reviewed: 09/18/2007 [facility] Patient Information 2014 [place], [place]. PATIENT INSTRUCTIONS POST-ANESTHESIA  IMMEDIATELY FOLLOWING SURGERY:  Do not drive or operate machinery for the first twenty four hours after surgery.  Do not make any important decisions for twenty four hours after surgery or while taking narcotic pain medications or sedatives.  If you develop intractable nausea and vomiting or a severe headache please notify your doctor immediately.  FOLLOW-UP:  Please make an appointment with your surgeon as instructed. You do not need to follow up with anesthesia unless specifically instructed to do so.  WOUND CARE INSTRUCTIONS (if applicable):  Keep a dry clean dressing on the anesthesia/puncture wound site if there is drainage.  Once the wound has quit draining you may leave it open to air.  Generally you should leave the bandage intact for twenty four hours unless there is drainage.  If the epidural site drains for more than 36-48 hours please call the anesthesia department.  QUESTIONS?:  Please feel free to call your physician or the hospital operator if you have any questions, and they will be happy to assist you.

## 2013-04-22 NOTE — Telephone Encounter (Signed)
Contacted insurer, [place], [place] # [phone], regarding out-patient surgery scheduled 04/27/13 at [facility], CPT [phone], ICD9 354.0; left voice message with pertinent information to Care management operations department, which message states will return call within 1 business day, regarding pre-authorization information.

## 2013-04-23 ENCOUNTER — Encounter (HOSPITAL_COMMUNITY)
Admission: RE | Admit: 2013-04-23 | Discharge: 2013-04-23 | Disposition: A | Discharge status: Home / Self Care | Payer: BC Managed Care – PPO | Source: Ambulatory Visit | Attending: Orthopedic Surgery | Admitting: Orthopedic Surgery

## 2013-04-23 ENCOUNTER — Encounter (HOSPITAL_COMMUNITY): Payer: Self-pay

## 2013-04-23 ENCOUNTER — Other Ambulatory Visit: Payer: Self-pay | Admitting: *Deleted

## 2013-04-23 DIAGNOSIS — E876 Hypokalemia: Secondary | ICD-10-CM

## 2013-04-23 LAB — BASIC METABOLIC PANEL
BUN: 10 mg/dL (ref 6–23)
CO2: 33 mEq/L — ABNORMAL HIGH (ref 19–32)
Calcium: 10 mg/dL (ref 8.4–10.5)
Chloride: 102 mEq/L (ref 96–112)
Creatinine, Ser: 0.72 mg/dL (ref 0.50–1.10)
GFR calc Af Amer: >90 mL/min (ref >90)
GFR calc non Af Amer: >90 mL/min (ref >90)
Glucose, Bld: 70 mg/dL (ref 70–99)
Potassium: 3 mEq/L — ABNORMAL LOW (ref 3.5–5.1)
Sodium: 142 mEq/L (ref 135–145)

## 2013-04-23 LAB — HEMOGLOBIN AND HEMATOCRIT, BLOOD
HCT: 38.4 % (ref 36.0–46.0)
Hemoglobin: 13.2 g/dL (ref 12.0–15.0)

## 2013-04-23 LAB — SURGICAL PCR SCREEN
MRSA, PCR: NEGATIVE
Staphylococcus aureus: NEGATIVE

## 2013-04-23 MED ORDER — POTASSIUM CHLORIDE ER 10 MEQ PO TBCR: 20.0000 meq | EXTENDED_RELEASE_TABLET | Freq: Three times a day (TID) | ORAL | Status: DC

## 2013-04-23 NOTE — Progress Notes (Signed)
Dr. Romeo Apple notified of pt's hypokalemia. Pt instructed to pick up prescription at [facility].

## 2013-04-23 NOTE — Telephone Encounter (Signed)
Received call back  From Leane Platt [facility] Management representative, no pre-authorization required.  Her name, today's date, 04/23/13, 3:12p.m., for reference.

## 2013-04-26 NOTE — H&P (Signed)
Kimberly Roman is an 57 y.o. female.   Chief Complaint: pain and paresthesias right hand  HPI: The patient has been treated for carpal tunnel syndrome for greater than 6 weeks with vitamin B6 and wrist brace and anti-inflammatories she has not made significant improvement still has significant night pain and numbness and tingling in her right hand she is interested in carpal tunnel release.   Past Medical History  Diagnosis Date  . Hypertension   . Hyperlipemia   . Environmental allergies   . Arthritis   . Carpal tunnel syndrome   . Ankylosing spondylitis     Past Surgical History  Procedure Laterality Date  . Abdominal hysterectomy    . Colonoscopy N/A 01/20/2013    Procedure: COLONOSCOPY;  Surgeon: West Bali, MD;  Location: [facility];  Service: Endoscopy;  Laterality: N/A;  9:30 AM    Family History  Problem Relation Age of Onset  . Colon cancer Neg Hx   . Arthritis     Social History:  reports that she has never smoked. She does not have any smokeless tobacco history on file. She reports that she does not drink alcohol or use illicit drugs.  Allergies:  Allergies  Allergen Reactions  . Lipitor (Atorvastatin Calcium) Other (See Comments)    REACTION: Cramping in legs/lower extremties    No prescriptions prior to admission    No results found for this or any previous visit (from the past 48 hour(s)). No results found.  ROS Review of systems no constitutional symptoms eyes normal ENT normal no chest pain no asthma no weight change no incontinence no other joint swelling skin normal numbness and tingling described no depression denies polyphagia no bleeding tendencies no allergies   There were no vitals taken for this visit. Physical Exam  BP 132/80  Ht 5\' 5"  (1.651 m)  Wt 166 lb (75.297 kg)  BMI 27.62 kg/m2 General appearance is normal, the patient is alert and oriented x3 with normal mood and affect. Gait normal  The right and left lower  extremity:  Inspection and palpation revealed no tenderness or abnormality in alignment in the lower extremities. Range of motion is full.  Strength is grade 5. All joints are stable.  Left arm: tender over the lateral epicondyle, slight decrease in extension, muscle tone is normal  The elbow is stable   Right hand: she is tender over the carpal tunnel, her ROM is normal and her wrist is stable; her strength is slightly less than what I consider normal when testing grip. Provocative tests are equivocal  Her NCS was positive for CTS      Assessment/Plan Right Carpal tunnel syndrome   Right carpal tunnel release   Kimberly Roman 04/26/2013, 7:55 PM

## 2013-04-27 ENCOUNTER — Encounter (HOSPITAL_COMMUNITY): Payer: Self-pay | Admitting: Anesthesiology

## 2013-04-27 ENCOUNTER — Ambulatory Visit (HOSPITAL_COMMUNITY): Payer: BC Managed Care – PPO | Admitting: Anesthesiology

## 2013-04-27 ENCOUNTER — Ambulatory Visit (HOSPITAL_COMMUNITY)
Admission: RE | Admit: 2013-04-27 | Discharge: 2013-04-27 | Disposition: A | Discharge status: Home / Self Care | Payer: BC Managed Care – PPO | Source: Ambulatory Visit | Attending: Orthopedic Surgery | Admitting: Orthopedic Surgery

## 2013-04-27 ENCOUNTER — Encounter (HOSPITAL_COMMUNITY)
Admission: RE | Disposition: A | Discharge status: Home / Self Care | Payer: Self-pay | Source: Ambulatory Visit | Attending: Orthopedic Surgery

## 2013-04-27 DIAGNOSIS — G5601 Carpal tunnel syndrome, right upper limb: Secondary | ICD-10-CM

## 2013-04-27 DIAGNOSIS — G5600 Carpal tunnel syndrome, unspecified upper limb: Secondary | ICD-10-CM

## 2013-04-27 DIAGNOSIS — E785 Hyperlipidemia, unspecified: Secondary | ICD-10-CM | POA: Insufficient documentation

## 2013-04-27 DIAGNOSIS — Z79899 Other long term (current) drug therapy: Secondary | ICD-10-CM | POA: Insufficient documentation

## 2013-04-27 DIAGNOSIS — Z01810 Encounter for preprocedural cardiovascular examination: Secondary | ICD-10-CM | POA: Insufficient documentation

## 2013-04-27 DIAGNOSIS — Z01812 Encounter for preprocedural laboratory examination: Secondary | ICD-10-CM | POA: Insufficient documentation

## 2013-04-27 DIAGNOSIS — I10 Essential (primary) hypertension: Secondary | ICD-10-CM | POA: Insufficient documentation

## 2013-04-27 HISTORY — PX: CARPAL TUNNEL RELEASE: SHX101

## 2013-04-27 LAB — POCT I-STAT 4, (NA,K, GLUC, HGB,HCT)
Glucose, Bld: 96 mg/dL (ref 70–99)
HCT: 39 % (ref 36.0–46.0)
Hemoglobin: 13.3 g/dL (ref 12.0–15.0)
Potassium: 3.4 mEq/L — ABNORMAL LOW (ref 3.5–5.1)
Sodium: 145 mEq/L (ref 135–145)

## 2013-04-27 SURGERY — CARPAL TUNNEL RELEASE
Anesthesia: Monitor Anesthesia Care | Laterality: Right | Wound class: Clean

## 2013-04-27 MED ORDER — ONDANSETRON HCL 4 MG/2ML IJ SOLN
4.0000 mg | Freq: Once | INTRAMUSCULAR | Status: AC
  Administered 2013-04-27: 4 mg via INTRAVENOUS

## 2013-04-27 MED ORDER — SODIUM CHLORIDE 0.9 % IR SOLN
Status: DC | PRN
  Administered 2013-04-27: 50 mL

## 2013-04-27 MED ORDER — LACTATED RINGERS IV SOLN
INTRAVENOUS | Status: DC
  Administered 2013-04-27: 07:00:00 via INTRAVENOUS

## 2013-04-27 MED ORDER — ONDANSETRON HCL 4 MG/2ML IJ SOLN
INTRAMUSCULAR | Status: AC
  Filled 2013-04-27: qty 2

## 2013-04-27 MED ORDER — CEFAZOLIN SODIUM-DEXTROSE 2-3 GM-% IV SOLR
INTRAVENOUS | Status: AC
  Filled 2013-04-27: qty 50

## 2013-04-27 MED ORDER — MIDAZOLAM HCL 2 MG/2ML IJ SOLN
INTRAMUSCULAR | Status: AC
  Filled 2013-04-27: qty 2

## 2013-04-27 MED ORDER — FENTANYL CITRATE 0.05 MG/ML IJ SOLN: 25.0000 ug | INTRAMUSCULAR | Status: DC | PRN

## 2013-04-27 MED ORDER — LIDOCAINE HCL (PF) 0.5 % IJ SOLN
INTRAMUSCULAR | Status: AC
  Filled 2013-04-27: qty 50

## 2013-04-27 MED ORDER — FENTANYL CITRATE 0.05 MG/ML IJ SOLN
INTRAMUSCULAR | Status: AC
  Filled 2013-04-27: qty 2

## 2013-04-27 MED ORDER — CEFAZOLIN SODIUM-DEXTROSE 2-3 GM-% IV SOLR: 2.0000 g | INTRAVENOUS | Status: DC

## 2013-04-27 MED ORDER — HYDROCODONE-ACETAMINOPHEN 7.5-325 MG PO TABS: 1.0000 | ORAL_TABLET | ORAL | Status: DC | PRN

## 2013-04-27 MED ORDER — LACTATED RINGERS IV SOLN
INTRAVENOUS | Status: DC | PRN
  Administered 2013-04-27: 07:00:00 via INTRAVENOUS

## 2013-04-27 MED ORDER — MIDAZOLAM HCL 2 MG/2ML IJ SOLN
1.0000 mg | INTRAMUSCULAR | Status: DC | PRN
  Administered 2013-04-27: 2 mg via INTRAVENOUS

## 2013-04-27 MED ORDER — ONDANSETRON HCL 4 MG/2ML IJ SOLN
4.0000 mg | Freq: Once | INTRAMUSCULAR | Status: AC | PRN
  Administered 2013-04-27: 4 mg via INTRAVENOUS

## 2013-04-27 MED ORDER — LIDOCAINE HCL (PF) 1 % IJ SOLN
INTRAMUSCULAR | Status: AC
  Filled 2013-04-27: qty 5

## 2013-04-27 MED ORDER — CHLORHEXIDINE GLUCONATE 4 % EX LIQD: 60.0000 mL | Freq: Once | CUTANEOUS | Status: DC

## 2013-04-27 MED ORDER — BUPIVACAINE HCL (PF) 0.5 % IJ SOLN
INTRAMUSCULAR | Status: DC | PRN
  Administered 2013-04-27: 10 mL

## 2013-04-27 MED ORDER — PROPOFOL INFUSION 10 MG/ML OPTIME
INTRAVENOUS | Status: DC | PRN
  Administered 2013-04-27: 50 ug/kg/min via INTRAVENOUS

## 2013-04-27 MED ORDER — BUPIVACAINE HCL (PF) 0.5 % IJ SOLN
INTRAMUSCULAR | Status: AC
  Filled 2013-04-27: qty 30

## 2013-04-27 MED ORDER — LIDOCAINE HCL (CARDIAC) 10 MG/ML IV SOLN
INTRAVENOUS | Status: DC | PRN
  Administered 2013-04-27: 50 mg via INTRAVENOUS

## 2013-04-27 MED ORDER — SODIUM CHLORIDE 0.9 % IJ SOLN
INTRAMUSCULAR | Status: AC
  Filled 2013-04-27: qty 10

## 2013-04-27 MED ORDER — FENTANYL CITRATE 0.05 MG/ML IJ SOLN
25.0000 ug | INTRAMUSCULAR | Status: DC | PRN
  Administered 2013-04-27 (×2): 50 ug via INTRAVENOUS

## 2013-04-27 MED ORDER — CEFAZOLIN SODIUM-DEXTROSE 2-3 GM-% IV SOLR
INTRAVENOUS | Status: DC | PRN
  Administered 2013-04-27: 2 g via INTRAVENOUS

## 2013-04-27 MED ORDER — PROPOFOL 10 MG/ML IV EMUL
INTRAVENOUS | Status: AC
  Filled 2013-04-27: qty 20

## 2013-04-27 SURGICAL SUPPLY — 39 items
BAG HAMPER (MISCELLANEOUS) ×2 IMPLANT
BANDAGE ELASTIC 3 VELCRO NS (GAUZE/BANDAGES/DRESSINGS) ×2 IMPLANT
BANDAGE ESMARK 4X12 BL STRL LF (DISPOSABLE) ×1 IMPLANT
BANDAGE GAUZE ELAST BULKY 4 IN (GAUZE/BANDAGES/DRESSINGS) ×1 IMPLANT
BLADE SURG 15 STRL LF DISP TIS (BLADE) IMPLANT
BLADE SURG 15 STRL SS (BLADE) ×2
BNDG CMPR 12X4 ELC STRL LF (DISPOSABLE) ×1
BNDG COHESIVE 4X5 TAN NS LF (GAUZE/BANDAGES/DRESSINGS) ×2 IMPLANT
BNDG ESMARK 4X12 BLUE STRL LF (DISPOSABLE) ×2
CHLORAPREP W/TINT 26ML (MISCELLANEOUS) ×2 IMPLANT
CLOTH BEACON ORANGE TIMEOUT ST (SAFETY) ×2 IMPLANT
COVER LIGHT HANDLE STERIS (MISCELLANEOUS) ×4 IMPLANT
CUFF TOURNIQUET SINGLE 18IN (TOURNIQUET CUFF) ×2 IMPLANT
DECANTER SPIKE VIAL GLASS SM (MISCELLANEOUS) ×2 IMPLANT
DRSG XEROFORM 1X8 (GAUZE/BANDAGES/DRESSINGS) ×1 IMPLANT
ELECT NDL TIP 2.8 STRL (NEEDLE) ×1 IMPLANT
ELECT NEEDLE TIP 2.8 STRL (NEEDLE) ×2 IMPLANT
ELECT REM PT RETURN 9FT ADLT (ELECTROSURGICAL) ×2
ELECTRODE REM PT RTRN 9FT ADLT (ELECTROSURGICAL) ×1 IMPLANT
GLOVE BIOGEL PI IND STRL 7.0 (GLOVE) IMPLANT
GLOVE BIOGEL PI INDICATOR 7.0 (GLOVE) ×2
GLOVE SKINSENSE NS SZ8.0 LF (GLOVE) ×1
GLOVE SKINSENSE STRL SZ8.0 LF (GLOVE) ×1 IMPLANT
GLOVE SS BIOGEL STRL SZ 6.5 (GLOVE) IMPLANT
GLOVE SS N UNI LF 8.5 STRL (GLOVE) ×2 IMPLANT
GLOVE SUPERSENSE BIOGEL SZ 6.5 (GLOVE) ×1
GOWN STRL REIN XL XLG (GOWN DISPOSABLE) ×6 IMPLANT
HAND ALUMI XLG (SOFTGOODS) ×2 IMPLANT
KIT ROOM TURNOVER APOR (KITS) ×2 IMPLANT
MANIFOLD NEPTUNE II (INSTRUMENTS) ×2 IMPLANT
NDL HYPO 21X1.5 SAFETY (NEEDLE) ×1 IMPLANT
NEEDLE HYPO 21X1.5 SAFETY (NEEDLE) ×2 IMPLANT
NS IRRIG 1000ML POUR BTL (IV SOLUTION) ×2 IMPLANT
PACK BASIC LIMB (CUSTOM PROCEDURE TRAY) ×2 IMPLANT
PAD ARMBOARD 7.5X6 YLW CONV (MISCELLANEOUS) ×2 IMPLANT
SET BASIN LINEN APH (SET/KITS/TRAYS/PACK) ×2 IMPLANT
SPONGE GAUZE 4X4 12PLY (GAUZE/BANDAGES/DRESSINGS) ×2 IMPLANT
SUT ETHILON 3 0 FSL (SUTURE) ×2 IMPLANT
SYR CONTROL 10ML LL (SYRINGE) ×2 IMPLANT

## 2013-04-27 NOTE — Interval H&P Note (Signed)
History and Physical Interval Note:  04/27/2013 7:28 AM  Kimberly Roman  has presented today for surgery, with the diagnosis of right carpal tunnel syndrome  The various methods of treatment have been discussed with the patient and family. After consideration of risks, benefits and other options for treatment, the patient has consented to  Procedure(s): CARPAL TUNNEL RELEASE (Right) as a surgical intervention .  The patient's history has been reviewed, patient examined, no change in status, stable for surgery.  I have reviewed the patient's chart and labs.  Questions were answered to the patient's satisfaction.     [name]

## 2013-04-27 NOTE — Preoperative (Signed)
Beta Blockers   Reason not to administer Beta Blockers:Not Applicable 

## 2013-04-27 NOTE — Anesthesia Procedure Notes (Addendum)
Performed by: Carolyne Littles, Keasia Dubose L Pre-anesthesia Checklist: Patient identified, Timeout performed, Emergency Drugs available, Suction available and Patient being monitored Oxygen Delivery Method: Non-rebreather mask   Anesthesia Regional Block:  Bier block (IV Regional)  Pre-Anesthetic Checklist: ,, timeout performed, Correct Patient, Correct Site, Correct Laterality, Correct Procedure,, site marked, surgical consent,, at surgeon's request  Laterality: Right     Needles:  Injection technique: Single-shot  Needle Type: Other      Needle Gauge: 22 and 22 G    Additional Needles: Bier block (IV Regional)  Nerve Stimulator or Paresthesia:   Additional Responses:  Pulse checked post tourniquet inflation. IV NSL discontinued post injection. Narrative:  Start time: 04/27/2013 7:40 AM  Performed by: Personally   Bier block (IV Regional)

## 2013-04-27 NOTE — Anesthesia Postprocedure Evaluation (Signed)
  Anesthesia Post-op Note  Patient: Kimberly Roman  Procedure(s) Performed: Procedure(s): RIGHT CARPAL TUNNEL RELEASE (Right)  Patient Location: PACU  Anesthesia Type:Bier block  Level of Consciousness: sedated and patient cooperative  Airway and Oxygen Therapy: Patient Spontanous Breathing and Patient connected to nasal cannula oxygen  Post-op Pain: none  Post-op Assessment: Post-op Vital signs reviewed, Patient's Cardiovascular Status Stable, Respiratory Function Stable, RESPIRATORY FUNCTION UNSTABLE, No signs of Nausea or vomiting and Pain level controlled  Post-op Vital Signs: Reviewed and stable  Complications: No apparent anesthesia complications

## 2013-04-27 NOTE — Brief Op Note (Signed)
04/27/2013  8:05 AM  PATIENT:  Kimberly Roman  57 y.o. female  PRE-OPERATIVE DIAGNOSIS:  right carpal tunnel syndrome  POST-OPERATIVE DIAGNOSIS:  right carpal tunnel syndrome  Operative findings compression of the median nerve no space-occupying lesions  Details of procedure the patient was identified in the preoperative holding area the right hand was confirmed as surgical site marked by the patient and the physician. The chart was reviewed and updated. Patient was taken to the operating room given 2 g of IV Ancef. After a regional anesthetic was administered with a Bier block the right hand was prepped and draped in sterile technique  The patient was in supine position the arm was supinated and placed on an armboard. A straight incision was made over the carpal tunnel in line with the radial border of the ring finger. Simultaneous tissue was divided down to the palmar fascia. Blunt dissection was carried out until the distal portion of the transverse carpal ligament was identified. Sharp dissection was used to release the ligament. A blunt scissor was used to release the proximal portion of the ligament. The nerve was identified intact and found to be compressed but with good color. The wound was irrigated and closed with 3-0 nylon sutures in interrupted fashion. 10 cc of Marcaine plain half percent was injected. A sterile bandage was applied.  PROCEDURE:  Procedure(s): RIGHT CARPAL TUNNEL RELEASE (Right)  SURGEON:  Surgeon(s) and Role:    * Vickki Hearing, MD - Primary  PHYSICIAN ASSISTANT:   ASSISTANTS: none    ANESTHESIA:   regional  EBL:  Total I/O In: 200 [I.V.:200] Out: 0   BLOOD ADMINISTERED:none  DRAINS: none   LOCAL MEDICATIONS USED:  MARCAINE plain   and Amount: 10 ml  SPECIMEN:  No Specimen  DISPOSITION OF SPECIMEN:  N/A  COUNTS:  YES  TOURNIQUET:  * Missing tourniquet times found for documented tourniquets in log:  [id number] *  DICTATION: .Dragon  Dictation  PLAN OF CARE: Discharge to home after PACU  PATIENT DISPOSITION:  PACU - hemodynamically stable.   Delay start of Pharmacological VTE agent (>24hrs) due to surgical blood loss or risk of bleeding: not applicable

## 2013-04-27 NOTE — Anesthesia Preprocedure Evaluation (Signed)
Anesthesia Evaluation  Patient identified by MRN, date of birth, ID band Patient awake    Reviewed: Allergy & Precautions, H&P , NPO status , Patient's Chart, lab work & pertinent test results  Airway Mallampati: II TM Distance: >3 FB Neck ROM: Full    Dental  (+) Teeth Intact   Pulmonary neg pulmonary ROS,  breath sounds clear to auscultation        Cardiovascular hypertension, Pt. on medications Rhythm:Regular Rate:Normal     Neuro/Psych  Neuromuscular disease    GI/Hepatic negative GI ROS,   Endo/Other    Renal/GU      Musculoskeletal   Abdominal   Peds  Hematology   Anesthesia Other Findings   Reproductive/Obstetrics                           Anesthesia Physical Anesthesia Plan  ASA: II  Anesthesia Plan: MAC and Bier Block   Post-op Pain Management:    Induction: Intravenous  Airway Management Planned: Nasal Cannula  Additional Equipment:   Intra-op Plan:   Post-operative Plan:   Informed Consent: I have reviewed the patients History and Physical, chart, labs and discussed the procedure including the risks, benefits and alternatives for the proposed anesthesia with the patient or authorized representative who has indicated his/her understanding and acceptance.     Plan Discussed with:   Anesthesia Plan Comments:         Anesthesia Quick Evaluation

## 2013-04-27 NOTE — Transfer of Care (Signed)
Immediate Anesthesia Transfer of Care Note  Patient: Kimberly Roman  Procedure(s) Performed: Procedure(s): RIGHT CARPAL TUNNEL RELEASE (Right)  Patient Location: PACU  Anesthesia Type:Bier block  Level of Consciousness: sedated and patient cooperative  Airway & Oxygen Therapy: Patient Spontanous Breathing and Patient connected to nasal cannula oxygen  Post-op Assessment: Report given to PACU RN and Post -op Vital signs reviewed and stable  Post vital signs: Reviewed and stable  Complications: No apparent anesthesia complications

## 2013-04-27 NOTE — Op Note (Signed)
04/27/2013  8:05 AM  PATIENT:  Kimberly Roman  57 y.o. female  PRE-OPERATIVE DIAGNOSIS:  right carpal tunnel syndrome  POST-OPERATIVE DIAGNOSIS:  right carpal tunnel syndrome  Operative findings compression of the median nerve no space-occupying lesions  Details of procedure the patient was identified in the preoperative holding area the right hand was confirmed as surgical site marked by the patient and the physician. The chart was reviewed and updated. Patient was taken to the operating room given 2 g of IV Ancef. After a regional anesthetic was administered with a Bier block the right hand was prepped and draped in sterile technique  The patient was in supine position the arm was supinated and placed on an armboard. A straight incision was made over the carpal tunnel in line with the radial border of the ring finger. Simultaneous tissue was divided down to the palmar fascia. Blunt dissection was carried out until the distal portion of the transverse carpal ligament was identified. Sharp dissection was used to release the ligament. A blunt scissor was used to release the proximal portion of the ligament. The nerve was identified intact and found to be compressed but with good color. The wound was irrigated and closed with 3-0 nylon sutures in interrupted fashion. 10 cc of Marcaine plain half percent was injected. A sterile bandage was applied.  PROCEDURE:  Procedure(s): RIGHT CARPAL TUNNEL RELEASE (Right)  SURGEON:  Surgeon(s) and Role:    * Stanley E Harrison, MD - Primary  PHYSICIAN ASSISTANT:   ASSISTANTS: none    ANESTHESIA:   regional  EBL:  Total I/O In: 200 [I.V.:200] Out: 0   BLOOD ADMINISTERED:none  DRAINS: none   LOCAL MEDICATIONS USED:  MARCAINE plain   and Amount: 10 ml  SPECIMEN:  No Specimen  DISPOSITION OF SPECIMEN:  N/A  COUNTS:  YES  TOURNIQUET:  * Missing tourniquet times found for documented tourniquets in log:  [id number] *  DICTATION: .Dragon  Dictation  PLAN OF CARE: Discharge to home after PACU  PATIENT DISPOSITION:  PACU - hemodynamically stable.   Delay start of Pharmacological VTE agent (>24hrs) due to surgical blood loss or risk of bleeding: not applicable  

## 2013-04-29 ENCOUNTER — Ambulatory Visit (INDEPENDENT_AMBULATORY_CARE_PROVIDER_SITE_OTHER): Payer: BC Managed Care – PPO | Admitting: Orthopedic Surgery

## 2013-04-29 ENCOUNTER — Encounter: Payer: Self-pay | Admitting: Orthopedic Surgery

## 2013-04-29 VITALS — BP 120/86 | Ht 65.0 in | Wt 166.0 lb

## 2013-04-29 DIAGNOSIS — Z9889 Other specified postprocedural states: Secondary | ICD-10-CM

## 2013-04-29 DIAGNOSIS — G5601 Carpal tunnel syndrome, right upper limb: Secondary | ICD-10-CM

## 2013-04-29 DIAGNOSIS — G5600 Carpal tunnel syndrome, unspecified upper limb: Secondary | ICD-10-CM

## 2013-04-29 NOTE — Progress Notes (Signed)
Patient ID: Kimberly Roman, female   DOB: May 14, 1956, 57 y.o.   MRN: [id number] First postop appointment status post carpal tunnel release on June 2  Wound check  Patient reports improvement in her overall symptoms. She is also getting good relief from anti-inflammatories and gabapentin  Her incision clean dry and intact  Recommend followup for suture removal at postop day 14

## 2013-04-30 ENCOUNTER — Ambulatory Visit: Payer: BC Managed Care – PPO | Admitting: Orthopedic Surgery

## 2013-05-11 ENCOUNTER — Ambulatory Visit (INDEPENDENT_AMBULATORY_CARE_PROVIDER_SITE_OTHER): Payer: BC Managed Care – PPO | Admitting: Orthopedic Surgery

## 2013-05-11 ENCOUNTER — Encounter: Payer: Self-pay | Admitting: Orthopedic Surgery

## 2013-05-11 VITALS — BP 132/98 | Ht 65.0 in | Wt 166.0 lb

## 2013-05-11 DIAGNOSIS — G5600 Carpal tunnel syndrome, unspecified upper limb: Secondary | ICD-10-CM

## 2013-05-11 DIAGNOSIS — G5601 Carpal tunnel syndrome, right upper limb: Secondary | ICD-10-CM

## 2013-05-11 MED ORDER — GABAPENTIN 600 MG PO TABS
600.0000 mg | ORAL_TABLET | Freq: Three times a day (TID) | ORAL | Status: DC
Start: 2013-05-11 — End: 2016-08-28

## 2013-05-11 NOTE — Patient Instructions (Addendum)
Ice 3 x a day   Work on grip strength   Take neurontin

## 2013-05-11 NOTE — Progress Notes (Signed)
Patient ID: Kimberly Roman, female   DOB: [birth year], 57 y.o.   MRN: [id number]   Postop visit #2 status post right carpal tunnel release  Patient ports for suture removal  Complains of throbbing pain in the wrist  Intermittent numbness. She would like a refill on her Neurontin  Her incision looks clean dry after the sutures are removed. She has excellent range of motion but weak grip strength  She is advised to apply ice 3 times a day continue Neurontin and continue work on her grip strength followup in 4 weeks

## 2013-06-08 ENCOUNTER — Ambulatory Visit (INDEPENDENT_AMBULATORY_CARE_PROVIDER_SITE_OTHER): Payer: BC Managed Care – PPO | Admitting: Orthopedic Surgery

## 2013-06-08 ENCOUNTER — Encounter: Payer: Self-pay | Admitting: Orthopedic Surgery

## 2013-06-08 VITALS — BP 132/82 | Ht 65.0 in | Wt 166.0 lb

## 2013-06-08 DIAGNOSIS — G5600 Carpal tunnel syndrome, unspecified upper limb: Secondary | ICD-10-CM

## 2013-06-08 DIAGNOSIS — G5602 Carpal tunnel syndrome, left upper limb: Secondary | ICD-10-CM

## 2013-06-08 MED ORDER — IBUPROFEN 800 MG PO TABS: 800.0000 mg | ORAL_TABLET | Freq: Three times a day (TID) | ORAL | Status: DC | PRN

## 2013-06-08 NOTE — Patient Instructions (Addendum)
Take Ibuprofen and continue to ice  OOW X 4 WEEKS

## 2013-06-08 NOTE — Progress Notes (Signed)
Patient ID: Kimberly Roman, female   DOB: [birth year], 57 y.o.   MRN: [id number] Chief Complaint  Patient presents with  . Follow-up    4 week recheck right CTR DOS 04/27/13    This patient is now about 6 weeks after carpal tunnel release she is complaining of soreness over the carpal tunnel and weakness. Her wound looks good she has some tenderness over the incision which looks normal and it is palpably tender. Her hand motion is normal she can make a full fist she has some mild weakness in terms of her grip strength.  I've advised her to put ice on her wrist and take ibuprofen instead of Mobic and to come back in 4 weeks. She says she cannot work and do her job because of the soreness in her wrist remain to keep her out of work for an additional 4 weeks.  Addendum she asked for Voltaren gel for her knees. I'm not sure we've ever seen her for that so we will look into that

## 2013-07-07 ENCOUNTER — Ambulatory Visit (INDEPENDENT_AMBULATORY_CARE_PROVIDER_SITE_OTHER): Payer: BC Managed Care – PPO | Admitting: Orthopedic Surgery

## 2013-07-07 ENCOUNTER — Encounter: Payer: Self-pay | Admitting: Orthopedic Surgery

## 2013-07-07 VITALS — BP 160/100 | Ht 65.0 in | Wt 166.0 lb

## 2013-07-07 DIAGNOSIS — Z9889 Other specified postprocedural states: Secondary | ICD-10-CM

## 2013-07-07 NOTE — Patient Instructions (Addendum)
Please give her a return to work note  OK to stop Neurontin and take ibuprofen as needed

## 2013-07-07 NOTE — Progress Notes (Signed)
Patient ID: Kimberly Roman, female   DOB: [birth year], 57 y.o.   MRN: [id number] Chief Complaint  Patient presents with  . Follow-up    4 week recheck right carpal tunnel DOS 04/27/13    BP 160/100  Ht 5\' 5"  (1.651 m)  Wt 166 lb (75.297 kg)  BMI 27.62 kg/m2  Finally made improvement requests a return to work note hand looks good no tenderness full range of motion good strength follow up as needed

## 2014-02-10 ENCOUNTER — Other Ambulatory Visit (HOSPITAL_COMMUNITY): Payer: Self-pay | Admitting: Orthopaedic Surgery

## 2014-02-10 DIAGNOSIS — M545 Low back pain, unspecified: Secondary | ICD-10-CM

## 2014-02-16 ENCOUNTER — Ambulatory Visit (HOSPITAL_COMMUNITY)
Admission: RE | Admit: 2014-02-16 | Discharge: 2014-02-16 | Disposition: A | Discharge status: Home / Self Care | Payer: BC Managed Care – PPO | Source: Ambulatory Visit | Attending: Orthopaedic Surgery | Admitting: Orthopaedic Surgery

## 2014-02-16 DIAGNOSIS — M545 Low back pain, unspecified: Secondary | ICD-10-CM

## 2014-02-16 DIAGNOSIS — M79609 Pain in unspecified limb: Secondary | ICD-10-CM | POA: Insufficient documentation

## 2014-02-16 DIAGNOSIS — M5126 Other intervertebral disc displacement, lumbar region: Secondary | ICD-10-CM | POA: Insufficient documentation

## 2014-02-16 DIAGNOSIS — M48061 Spinal stenosis, lumbar region without neurogenic claudication: Secondary | ICD-10-CM | POA: Insufficient documentation

## 2014-06-11 ENCOUNTER — Other Ambulatory Visit (HOSPITAL_COMMUNITY): Payer: Self-pay | Admitting: Internal Medicine

## 2014-06-11 DIAGNOSIS — Z1231 Encounter for screening mammogram for malignant neoplasm of breast: Secondary | ICD-10-CM

## 2014-06-14 ENCOUNTER — Ambulatory Visit (HOSPITAL_COMMUNITY)
Admission: RE | Admit: 2014-06-14 | Discharge: 2014-06-14 | Disposition: A | Discharge status: Home / Self Care | Payer: BC Managed Care – PPO | Source: Ambulatory Visit | Attending: Internal Medicine | Admitting: Internal Medicine

## 2014-06-14 DIAGNOSIS — Z1231 Encounter for screening mammogram for malignant neoplasm of breast: Secondary | ICD-10-CM | POA: Insufficient documentation

## 2015-11-27 DIAGNOSIS — Z923 Personal history of irradiation: Secondary | ICD-10-CM

## 2015-11-27 DIAGNOSIS — Z9221 Personal history of antineoplastic chemotherapy: Secondary | ICD-10-CM

## 2015-11-27 HISTORY — DX: Personal history of antineoplastic chemotherapy: Z92.21

## 2015-11-27 HISTORY — DX: Personal history of irradiation: Z92.3

## 2016-08-07 ENCOUNTER — Other Ambulatory Visit (HOSPITAL_COMMUNITY): Payer: Self-pay | Admitting: Internal Medicine

## 2016-08-07 DIAGNOSIS — Z1231 Encounter for screening mammogram for malignant neoplasm of breast: Secondary | ICD-10-CM

## 2016-08-09 ENCOUNTER — Ambulatory Visit (HOSPITAL_COMMUNITY)
Admission: RE | Admit: 2016-08-09 | Discharge: 2016-08-09 | Disposition: A | Discharge status: Home / Self Care | Payer: BLUE CROSS/BLUE SHIELD | Source: Ambulatory Visit | Attending: Internal Medicine | Admitting: Internal Medicine

## 2016-08-09 DIAGNOSIS — R928 Other abnormal and inconclusive findings on diagnostic imaging of breast: Secondary | ICD-10-CM | POA: Diagnosis not present

## 2016-08-09 DIAGNOSIS — Z1231 Encounter for screening mammogram for malignant neoplasm of breast: Secondary | ICD-10-CM | POA: Diagnosis not present

## 2016-08-15 ENCOUNTER — Other Ambulatory Visit: Payer: Self-pay | Admitting: Internal Medicine

## 2016-08-15 DIAGNOSIS — R928 Other abnormal and inconclusive findings on diagnostic imaging of breast: Secondary | ICD-10-CM

## 2016-08-21 ENCOUNTER — Ambulatory Visit (HOSPITAL_COMMUNITY)
Admission: RE | Admit: 2016-08-21 | Discharge: 2016-08-21 | Disposition: A | Discharge status: Home / Self Care | Payer: BLUE CROSS/BLUE SHIELD | Source: Ambulatory Visit | Attending: Internal Medicine | Admitting: Internal Medicine

## 2016-08-21 ENCOUNTER — Other Ambulatory Visit: Payer: Self-pay | Admitting: Internal Medicine

## 2016-08-21 DIAGNOSIS — C50411 Malignant neoplasm of upper-outer quadrant of right female breast: Secondary | ICD-10-CM | POA: Diagnosis not present

## 2016-08-21 DIAGNOSIS — R928 Other abnormal and inconclusive findings on diagnostic imaging of breast: Secondary | ICD-10-CM

## 2016-08-27 ENCOUNTER — Other Ambulatory Visit (HOSPITAL_COMMUNITY): Payer: Self-pay | Admitting: Internal Medicine

## 2016-08-27 DIAGNOSIS — R928 Other abnormal and inconclusive findings on diagnostic imaging of breast: Secondary | ICD-10-CM

## 2016-08-28 ENCOUNTER — Ambulatory Visit (HOSPITAL_COMMUNITY)
Admission: RE | Admit: 2016-08-28 | Discharge: 2016-08-28 | Disposition: A | Discharge status: Home / Self Care | Payer: BLUE CROSS/BLUE SHIELD | Source: Ambulatory Visit | Attending: Internal Medicine | Admitting: Internal Medicine

## 2016-08-28 ENCOUNTER — Encounter (HOSPITAL_COMMUNITY): Payer: Self-pay

## 2016-08-28 ENCOUNTER — Other Ambulatory Visit (HOSPITAL_COMMUNITY): Payer: Self-pay | Admitting: Internal Medicine

## 2016-08-28 DIAGNOSIS — N6310 Unspecified lump in the right breast, unspecified quadrant: Secondary | ICD-10-CM

## 2016-08-28 DIAGNOSIS — C50911 Malignant neoplasm of unspecified site of right female breast: Secondary | ICD-10-CM | POA: Diagnosis not present

## 2016-08-28 DIAGNOSIS — R928 Other abnormal and inconclusive findings on diagnostic imaging of breast: Secondary | ICD-10-CM | POA: Diagnosis present

## 2016-08-28 MED ORDER — LIDOCAINE HCL (PF) 1 % IJ SOLN
INTRAMUSCULAR | Status: AC
  Administered 2016-08-28: 13:00:00
  Filled 2016-08-28: qty 10

## 2016-08-28 NOTE — Discharge Instructions (Signed)
Breast Biopsy, Care After These instructions give you information on caring for yourself after your procedure. Your doctor may also give you more specific instructions. Call your doctor if you have any problems or questions after your procedure. HOME CARE  Only take medicine as told by your doctor.  Do not take aspirin.  Keep your sutures (stitches) dry when bathing.  Protect the biopsy area. Do not let the area get bumped.  Avoid activities that could pull the biopsy site open until your doctor approves. This includes:  Stretching.  Reaching.  Exercise.  Sports.  Lifting more than 3lb.  Continue your normal diet.  Wear a good support bra for as long as told by your doctor.  Change any bandages (dressings) as told by your doctor.  Do not drink alcohol while taking pain medicine.  Keep all doctor visits as told. Ask when your test results will be ready. Make sure you get your test results. GET HELP RIGHT AWAY IF:   You have a fever.  You have more bleeding (more than a small spot) from the biopsy site.  You have trouble breathing.  You have yellowish-white fluid (pus) coming from the biopsy site.  You have redness, puffiness (swelling), or more pain in the biopsy site.  You have a bad smell coming from the biopsy site.  Your biopsy site opens after sutures, staples, or sticky strips have been removed.  You have a rash.  You need stronger medicine. MAKE SURE YOU:  Understand these instructions.  Will watch your condition.  Will get help right away if you are not doing well or get worse.   This information is not intended to replace advice given to you by your health care provider. Make sure you discuss any questions you have with your health care provider.   Document Released: 09/08/2009 Document Revised: 12/03/2014 Document Reviewed: 12/23/2011 Elsevier Interactive Patient Education 2016 [employer].   Breast Biopsy A breast biopsy is a procedure  where a sample of breast tissue is removed from your breast. The tissue is examined under a microscope to see if cancerous cells are present. A breast biopsy is done when there is:  Any undiagnosed breast mass (tumor).  Nipple abnormalities, dimpling, crusting, or ulcerations.  Abnormal discharge from the nipple, especially blood.  Redness, swelling, and pain of the breast.  Calcium deposits (calcifications) or abnormalities seen on a mammogram, ultrasound result, or results of magnetic resonance imaging (MRI).  Suspicious changes in the breast seen on your mammogram. If the tumor is found to be cancerous (malignant), a breast biopsy can help to determine what the best treatment is for you. There are many different types of breast biopsies. Talk to your caregiver about your options and which type is best for you. LET YOUR CAREGIVER KNOW ABOUT:  Allergies to food or medicine.  Medicines taken, including vitamins, herbs, eyedrops, over-the-counter medicines, and creams.  Use of steroids (by mouth or creams).  Previous problems with anesthetics or numbing medicines.  History of bleeding problems or blood clots.  Previous surgery.  Other health problems, including diabetes and kidney problems.  Any recent colds or infections.  Possibility of pregnancy, if this applies. RISKS AND COMPLICATIONS   Bleeding.  Infection.  Allergy to medicines.  Bruising and swelling of the breast.  Alteration in the shape of the breast.  Not finding the lump or abnormality.  Needing more surgery. BEFORE THE PROCEDURE  Arrange for someone to drive you home after the procedure.  Do  not smoke for 2 weeks before the procedure. Stop smoking, if you smoke.  Do not drink alcohol for 24 hours before procedure.  Wear a good support bra to the procedure.  Your health care provider may perform a procedure to place a wire (needle localization) or a seed that gives off radiation (radioactive seed  localization) in the breast lump. A mammogram or ultrasound is done during this procedure to help with proper placement. The wire or seed will help the health care provider locate the lump when performing the biopsy, especially if the lump cannot be felt. PROCEDURE  You may be given a medicine to numb the breast area (local anesthesia) or a medicine to make you sleep (general anesthesia) during the procedure. The following are the different types of biopsies that can be performed.   Fine-needle aspiration--A thin needle is attached to a syringe and inserted into the breast lump. Fluid and cells are removed and then looked at under a microscope. If the breast lump cannot be felt, an ultrasound may be used to help locate the lump and place the needle in the correct area.   Core needle biopsy--A wide, hollow needle (core needle) is inserted into the breast lump 3-6 times to get tissue samples or cores. The samples are removed. The needle is usually placed in the correct area by using an ultrasound or X-ray.   Stereotactic biopsy--X-ray equipment and a computer are used to analyze X-ray pictures of the breast lump. The computer then finds exactly where the core needle needs to be inserted. Tissue samples are removed.   Vacuum-assisted biopsy--A small incision (less than  inch) is made in your breast. A biopsy device that includes a hollow needle and vacuum is passed through the incision and into the breast tissue. The vacuum gently draws abnormal breast tissue into the needle to remove it. This type of biopsy removes a larger tissue sample than a regular core needle biopsy. No stitches are needed, and there is usually little scarring.  Ultrasound-guided core needle biopsy--A high frequency ultrasound helps guide the core needle to the area of the mass or abnormality. An incision is made to insert the needle. Tissue samples are removed.  Open biopsy--A larger incision is made in the breast. Your  caregiver will attempt to remove the whole breast lump or as much as possible. AFTER THE PROCEDURE  You will be taken to the recovery area. If you are doing well and have no problems, you will be allowed to go home.  You may notice bruising on your breast. This is normal.  Your caregiver may apply a pressure dressing on your breast for 24-48 hours. A pressure dressing is a bandage that is wrapped tightly around the chest to stop fluid from collecting underneath tissues.   This information is not intended to replace advice given to you by your health care provider. Make sure you discuss any questions you have with your health care provider.   Document Released: 11/12/2005 Document Revised: 08/03/2015 Document Reviewed: 12/13/2011 Elsevier Interactive Patient Education [employer].

## 2016-09-18 ENCOUNTER — Other Ambulatory Visit (HOSPITAL_COMMUNITY): Payer: Self-pay | Admitting: General Surgery

## 2016-09-18 DIAGNOSIS — C50411 Malignant neoplasm of upper-outer quadrant of right female breast: Secondary | ICD-10-CM

## 2016-09-18 NOTE — H&P (Signed)
  NT[facility] SOAP Note  Vital Signs:  Vitals as of: [id number]: Systolic A999333: Diastolic 73: Heart Rate 61: Temp 97.42F (Temporal): Height 37ft 5in: Weight 150Lbs 0 Ounces: BMI 24.96   BMI : 24.96 kg/m2  Subjective: This 60 year old female presents for of a right breast cancer.  Referred by Dr. Legrand Rams.  Found on routine mammography.  Biospy shows invasive ductal carcinoma, 1.1cm in size.  Not felt by patient.  No nipple discharge, family h/o breast carcinoma.  No pain noted.  Review of Symptoms:  Constitutional:negative Head:negative Eyes:negative Nose/Mouth/Throat:negative Cardiovascular:negative Respiratory:negative Gastrointestinnegative Genitourinary:negative Musculoskeletal:negative Skin:negative as above Hematolgic/Lymphatic:negative Allergic/Immunologic:negative   Past Medical History:Reviewed  Past Medical History  Surgical History: carpal tunnel release Medical Problems: HTN, reflux Allergies: lipitor Medications: omeprazole, lisinopril/HCTZ, amlodipine   Social History:Reviewed  Social History  Preferred Language: English Race:  Black or African American Ethnicity: Not Hispanic / Latino Age: 60 year Marital Status:  S Alcohol: no   Smoking Status: Never smoker reviewed on 09/11/2016 Functional Status reviewed on 09/11/2016 ------------------------------------------------ Bathing: Normal Cooking: Normal Dressing: Normal Driving: Normal Eating: Normal Managing Meds: Normal Oral Care: Normal Shopping: Normal Toileting: Normal Transferring: Normal Walking: Normal Cognitive Status reviewed on 09/11/2016 ------------------------------------------------ Attention: Normal Decision Making: Normal Language: Normal Memory: Normal Motor: Normal Perception: Normal Problem Solving: Normal Visual and Spatial: Normal   Family History:Reviewed  Family Health History Mother, Living; Alcoholism;  Father     Objective  Information: General:Well appearing, well nourished in no distress. Skin:no rash or prominent lesions Head:Atraumatic; no masses; no abnormalities Neck:Supple without lymphadenopathy.  Heart:RRR, no murmur or gallop.  Normal S1, S2.  No S3, S4.  Lungs:CTA bilaterally, no wheezes, rhonchi, rales.  Breathing unlabored. Dominant mass noted in the upper, outer quadrant of the right breast.  No nipple discharge, dimpling.  Axilla negative for palpable nodes.  Left breast negative. Mammogram and path reports reviewed. Assessment:Right breast cancer  Diagnoses: 174.4  C50.411 Primary malignant neoplasm of upper outer quadrant of female breast (Malignant neoplasm of upper-outer quadrant of right female breast)  Procedures: [id number] - OFFICE OUTPATIENT NEW 30 MINUTES    Plan:  Discussed surgical options including partial mastectomy, sentinel lymph node biopsy/xrt vs modified radical mastectomy.  Both will be equally effective in treating the cancer.  Has elected to proceed with right partial mastectomy, sentinel lymph node biopsy, possible axillary dissection on 09/26/16.  Patient Education:Alternative treatments to surgery were discussed with patient (and family).Risks and benefits  of procedure including bleeding, infection, arm swelling, and recurrence were fully explained to the patient (and family) who gave informed consent. Patient/family questions were addressed.  Follow-up:Weeks 1

## 2016-09-21 ENCOUNTER — Encounter (HOSPITAL_COMMUNITY): Payer: Self-pay

## 2016-09-21 NOTE — Patient Instructions (Signed)
Kimberly Roman Signer  09/21/2016     @PREFPERIOPPHARMACY @   Your procedure is scheduled on 09/26/2016.  Report to [facility] at 8:00 A.M.  Call this number if you have problems the morning of surgery:  [phone]   Remember:  Do not eat food or drink liquids after midnight.  Take these medicines the morning of surgery with A SIP OF WATER : NORVASC, ZESTORETIC AND PRILOSEC   Do not wear jewelry, make-up or nail polish.  Do not wear lotions, powders, or perfumes, or deoderant.  Do not shave 48 hours prior to surgery.  Men may shave face and neck.  Do not bring valuables to the hospital.  [facility] is not responsible for any belongings or valuables.  Contacts, dentures or bridgework may not be worn into surgery.  Leave your suitcase in the car.  After surgery it may be brought to your room.  For patients admitted to the hospital, discharge time will be determined by your treatment team.  Patients discharged the day of surgery will not be allowed to drive home.   Name and phone number of your driver:   FAMILY Special instructions:  N/A  Please read over the following fact sheets that you were given. Care and Recovery After Surgery   Partial Mastectomy With Axillary Lymph Node Dissection Partial mastectomy with axillary lymph node dissection is surgery to remove breast cancer. It is a type of breast-conserving surgery. This means that the cancerous tissue is removed but the breast remains intact. During this procedure, the tumor and a small rim of healthy tissue surrounding it will be removed. You will also have lymph nodes removed from under your arm to see if the cancer has spread. LET [facility] CARE PROVIDER KNOW ABOUT:  Any allergies you have.  All medicines you are taking, including vitamins, herbs, eye drops, creams, and over-the-counter medicines.  Previous problems you or members of your family have had with the use of anesthetics.  Any blood disorders you  have.  Previous surgeries you have had.  Any medical conditions you may have. RISKS AND COMPLICATIONS  Generally, this is a safe procedure. However, problems may occur, including:  A change in the way your breast looks and feels.  Breast pain.  Infection.  Bleeding.  Pain, swelling, weakness, or numbness in the arm on the side of your surgery. BEFORE THE PROCEDURE  Ask your health care provider about:  Changing or stopping your regular medicines. This is especially important if you are taking diabetes medicines or blood thinners.  Taking medicines such as aspirin and ibuprofen. These medicines can thin your blood. Do not take these medicines before your procedure if your health care provider instructs you not to.  Follow your health care provider's instructions about eating or drinking restrictions. PROCEDURE  An IV tube will be inserted into one of your veins.  You will be given a medicine that makes you fall asleep (general anesthetic).  Your surgeon may mark your breast to indicate the location of your tumor and to plan the incision.  Your breast will be cleaned with a germ-killing solution (antiseptic).  Your surgeon will make an incision over the area of your breast where the tumor is located. This will usually be a curved incision that follows the normal shape of your breast.  The tumor will be removed along with a portion of the tissue that surrounds it.  If the tumor is close to the muscles over your chest, some muscle  tissue may also be removed.  The incision may extend to the lymph nodes under your arm, or a second incision may be made under your arm.  Lymph nodes will be removed from under your arm.  You may have a drainage tube inserted into your incision to collect fluid that builds up after surgery. This tube will be connected to a suction bulb.  Your incision or incisions will be closed with stitches (sutures).  A bandage (dressing) will be placed over  your breast and under your arm. The procedure may vary among health care providers and hospitals. AFTER THE PROCEDURE  Your blood pressure, heart rate, breathing rate, and blood oxygen level will be monitored often until the medicines you were given have worn off.  You will be given pain medicine as needed.  You will be encouraged to get up and walk as soon as you can.  Your IV tube will be removed when you are able to eat and drink.  Your drain may be removed before you go home from the hospital, or you may be sent home with your drain and suction bulb.   This information is not intended to replace advice given to you by your health care provider. Make sure you discuss any questions you have with your health care provider.   Document Released: 03/29/2015 Document Reviewed: 03/29/2015 Elsevier Interactive Patient Education [employer].

## 2016-09-24 ENCOUNTER — Ambulatory Visit (HOSPITAL_COMMUNITY)
Admission: RE | Admit: 2016-09-24 | Discharge: 2016-09-24 | Disposition: A | Discharge status: Home / Self Care | Payer: BLUE CROSS/BLUE SHIELD | Source: Ambulatory Visit | Attending: General Surgery | Admitting: General Surgery

## 2016-09-24 ENCOUNTER — Encounter (HOSPITAL_COMMUNITY): Payer: Self-pay

## 2016-09-24 ENCOUNTER — Encounter (HOSPITAL_COMMUNITY)
Admission: RE | Admit: 2016-09-24 | Discharge: 2016-09-24 | Disposition: A | Discharge status: Home / Self Care | Payer: BLUE CROSS/BLUE SHIELD | Source: Ambulatory Visit | Attending: General Surgery | Admitting: General Surgery

## 2016-09-24 DIAGNOSIS — C50311 Malignant neoplasm of lower-inner quadrant of right female breast: Secondary | ICD-10-CM

## 2016-09-24 DIAGNOSIS — R918 Other nonspecific abnormal finding of lung field: Secondary | ICD-10-CM | POA: Diagnosis not present

## 2016-09-24 HISTORY — DX: Gastro-esophageal reflux disease without esophagitis: K21.9

## 2016-09-24 LAB — COMPREHENSIVE METABOLIC PANEL
ALT: 15 U/L (ref 14–54)
AST: 17 U/L (ref 15–41)
Albumin: 4.4 g/dL (ref 3.5–5.0)
Alkaline Phosphatase: 79 U/L (ref 38–126)
Anion gap: 8 (ref 5–15)
BUN: 10 mg/dL (ref 6–20)
CO2: 29 mmol/L (ref 22–32)
Calcium: 9.5 mg/dL (ref 8.9–10.3)
Chloride: 102 mmol/L (ref 101–111)
Creatinine, Ser: 0.73 mg/dL (ref 0.44–1.00)
GFR calc Af Amer: >60 mL/min (ref >60)
GFR calc non Af Amer: >60 mL/min (ref >60)
Glucose, Bld: 172 mg/dL — ABNORMAL HIGH (ref 65–99)
Potassium: 2.9 mmol/L — ABNORMAL LOW (ref 3.5–5.1)
Sodium: 139 mmol/L (ref 135–145)
Total Bilirubin: 0.6 mg/dL (ref 0.3–1.2)
Total Protein: 7.7 g/dL (ref 6.5–8.1)

## 2016-09-24 LAB — CBC WITH DIFFERENTIAL/PLATELET
Basophils Absolute: 0 10*3/uL (ref 0.0–0.1)
Basophils Relative: 0 %
Eosinophils Absolute: 0 10*3/uL (ref 0.0–0.7)
Eosinophils Relative: 0 %
HCT: 39.1 % (ref 36.0–46.0)
Hemoglobin: 12.7 g/dL (ref 12.0–15.0)
Lymphocytes Relative: 18 %
Lymphs Abs: 1.6 10*3/uL (ref 0.7–4.0)
MCH: 29.6 pg (ref 26.0–34.0)
MCHC: 32.5 g/dL (ref 30.0–36.0)
MCV: 91.1 fL (ref 78.0–100.0)
Monocytes Absolute: 0.5 10*3/uL (ref 0.1–1.0)
Monocytes Relative: 6 %
Neutro Abs: 6.7 10*3/uL (ref 1.7–7.7)
Neutrophils Relative %: 76 %
Platelets: 302 10*3/uL (ref 150–400)
RBC: 4.29 MIL/uL (ref 3.87–5.11)
RDW: 13.3 % (ref 11.5–15.5)
WBC: 8.9 10*3/uL (ref 4.0–10.5)

## 2016-09-25 NOTE — Pre-Procedure Instructions (Signed)
Potassium results received, 2.9. Called to Dr Arnoldo Morale office, spoke with Jackelyn Poling. She will give him message when he comes in and have him call back. Dr Patsey Berthold also aware.

## 2016-09-25 NOTE — Pre-Procedure Instructions (Signed)
Spoke to Dr Arnoldo Morale concerning potassium. He is to contact patient to out her on potassium. Will recheck potassium in am.

## 2016-09-26 ENCOUNTER — Ambulatory Visit (HOSPITAL_COMMUNITY): Payer: BLUE CROSS/BLUE SHIELD | Admitting: Anesthesiology

## 2016-09-26 ENCOUNTER — Encounter (HOSPITAL_COMMUNITY): Payer: Self-pay

## 2016-09-26 ENCOUNTER — Encounter (HOSPITAL_COMMUNITY)
Admission: RE | Disposition: A | Discharge status: Home / Self Care | Payer: Self-pay | Source: Ambulatory Visit | Attending: General Surgery

## 2016-09-26 ENCOUNTER — Ambulatory Visit (HOSPITAL_COMMUNITY): Payer: BLUE CROSS/BLUE SHIELD

## 2016-09-26 ENCOUNTER — Ambulatory Visit (HOSPITAL_COMMUNITY)
Admission: RE | Admit: 2016-09-26 | Discharge: 2016-09-26 | Disposition: A | Discharge status: Home / Self Care | Payer: BLUE CROSS/BLUE SHIELD | Source: Ambulatory Visit | Attending: General Surgery | Admitting: General Surgery

## 2016-09-26 ENCOUNTER — Encounter (HOSPITAL_COMMUNITY)
Admission: RE | Admit: 2016-09-26 | Discharge: 2016-09-26 | Disposition: A | Discharge status: Home / Self Care | Payer: BLUE CROSS/BLUE SHIELD | Source: Ambulatory Visit | Attending: General Surgery | Admitting: General Surgery

## 2016-09-26 DIAGNOSIS — I10 Essential (primary) hypertension: Secondary | ICD-10-CM | POA: Insufficient documentation

## 2016-09-26 DIAGNOSIS — Z888 Allergy status to other drugs, medicaments and biological substances status: Secondary | ICD-10-CM | POA: Diagnosis not present

## 2016-09-26 DIAGNOSIS — C50411 Malignant neoplasm of upper-outer quadrant of right female breast: Secondary | ICD-10-CM | POA: Diagnosis not present

## 2016-09-26 DIAGNOSIS — K219 Gastro-esophageal reflux disease without esophagitis: Secondary | ICD-10-CM | POA: Insufficient documentation

## 2016-09-26 DIAGNOSIS — Z171 Estrogen receptor negative status [ER-]: Secondary | ICD-10-CM | POA: Insufficient documentation

## 2016-09-26 DIAGNOSIS — N6310 Unspecified lump in the right breast, unspecified quadrant: Secondary | ICD-10-CM

## 2016-09-26 HISTORY — PX: PARTIAL MASTECTOMY WITH AXILLARY SENTINEL LYMPH NODE BIOPSY: SHX6004

## 2016-09-26 LAB — POCT I-STAT 4, (NA,K, GLUC, HGB,HCT)
Glucose, Bld: 87 mg/dL (ref 65–99)
HCT: 38 % (ref 36.0–46.0)
Hemoglobin: 12.9 g/dL (ref 12.0–15.0)
Potassium: 4 mmol/L (ref 3.5–5.1)
Sodium: 144 mmol/L (ref 135–145)

## 2016-09-26 SURGERY — PARTIAL MASTECTOMY WITH AXILLARY SENTINEL LYMPH NODE BIOPSY
Anesthesia: General | Laterality: Right

## 2016-09-26 MED ORDER — CHLORHEXIDINE GLUCONATE CLOTH 2 % EX PADS: 6.0000 | MEDICATED_PAD | Freq: Once | CUTANEOUS | Status: DC

## 2016-09-26 MED ORDER — GLYCOPYRROLATE 0.2 MG/ML IJ SOLN
INTRAMUSCULAR | Status: DC | PRN
  Administered 2016-09-26: 0.4 mg via INTRAVENOUS

## 2016-09-26 MED ORDER — ENOXAPARIN SODIUM 40 MG/0.4ML ~~LOC~~ SOLN
SUBCUTANEOUS | Status: AC
  Filled 2016-09-26: qty 0.4

## 2016-09-26 MED ORDER — LACTATED RINGERS IV SOLN
INTRAVENOUS | Status: DC
  Administered 2016-09-26: 08:00:00 via INTRAVENOUS

## 2016-09-26 MED ORDER — METHYLENE BLUE 0.5 % INJ SOLN
INTRAVENOUS | Status: AC
  Filled 2016-09-26: qty 10

## 2016-09-26 MED ORDER — HYDROCODONE-ACETAMINOPHEN 5-325 MG PO TABS: 1.0000 | ORAL_TABLET | ORAL | 0 refills | Status: DC | PRN

## 2016-09-26 MED ORDER — KETOROLAC TROMETHAMINE 30 MG/ML IJ SOLN
INTRAMUSCULAR | Status: AC
  Filled 2016-09-26: qty 1

## 2016-09-26 MED ORDER — LIDOCAINE HCL (CARDIAC) 10 MG/ML IV SOLN
INTRAVENOUS | Status: DC | PRN
  Administered 2016-09-26: 30 mg via INTRAVENOUS

## 2016-09-26 MED ORDER — PROPOFOL 10 MG/ML IV BOLUS
INTRAVENOUS | Status: AC
  Filled 2016-09-26: qty 20

## 2016-09-26 MED ORDER — PENTAFLUOROPROP-TETRAFLUOROETH EX AERO
INHALATION_SPRAY | CUTANEOUS | Status: AC
  Filled 2016-09-26: qty 103.5

## 2016-09-26 MED ORDER — FENTANYL CITRATE (PF) 100 MCG/2ML IJ SOLN
25.0000 ug | INTRAMUSCULAR | Status: AC | PRN
  Administered 2016-09-26 (×2): 25 ug via INTRAVENOUS

## 2016-09-26 MED ORDER — ROCURONIUM BROMIDE 50 MG/5ML IV SOLN
INTRAVENOUS | Status: AC
  Filled 2016-09-26: qty 1

## 2016-09-26 MED ORDER — SODIUM CHLORIDE 0.9 % IJ SOLN
INTRAVENOUS | Status: DC | PRN
  Administered 2016-09-26: 5 mL

## 2016-09-26 MED ORDER — MIDAZOLAM HCL 2 MG/2ML IJ SOLN
1.0000 mg | INTRAMUSCULAR | Status: DC | PRN
  Administered 2016-09-26 (×2): 2 mg via INTRAVENOUS
  Filled 2016-09-26 (×2): qty 2

## 2016-09-26 MED ORDER — NEOSTIGMINE METHYLSULFATE 10 MG/10ML IV SOLN
INTRAVENOUS | Status: DC | PRN
  Administered 2016-09-26: 2 mg via INTRAVENOUS

## 2016-09-26 MED ORDER — FENTANYL CITRATE (PF) 100 MCG/2ML IJ SOLN
INTRAMUSCULAR | Status: DC | PRN
  Administered 2016-09-26 (×3): 25 ug via INTRAVENOUS
  Administered 2016-09-26: 50 ug via INTRAVENOUS
  Administered 2016-09-26: 25 ug via INTRAVENOUS

## 2016-09-26 MED ORDER — FENTANYL CITRATE (PF) 100 MCG/2ML IJ SOLN
INTRAMUSCULAR | Status: AC
  Filled 2016-09-26: qty 2

## 2016-09-26 MED ORDER — ENOXAPARIN SODIUM 40 MG/0.4ML ~~LOC~~ SOLN
40.0000 mg | Freq: Once | SUBCUTANEOUS | Status: AC
  Administered 2016-09-26: 40 mg via SUBCUTANEOUS

## 2016-09-26 MED ORDER — CEFAZOLIN SODIUM-DEXTROSE 2-4 GM/100ML-% IV SOLN
2.0000 g | INTRAVENOUS | Status: AC
  Administered 2016-09-26: 2 g via INTRAVENOUS

## 2016-09-26 MED ORDER — BUPIVACAINE HCL (PF) 0.5 % IJ SOLN
INTRAMUSCULAR | Status: DC | PRN
  Administered 2016-09-26: 10 mL

## 2016-09-26 MED ORDER — HYDROMORPHONE HCL 1 MG/ML IJ SOLN
0.2500 mg | INTRAMUSCULAR | Status: DC | PRN
  Administered 2016-09-26: 0.5 mg via INTRAVENOUS
  Filled 2016-09-26: qty 0.5

## 2016-09-26 MED ORDER — KETOROLAC TROMETHAMINE 30 MG/ML IJ SOLN
30.0000 mg | Freq: Once | INTRAMUSCULAR | Status: AC
  Administered 2016-09-26: 30 mg via INTRAVENOUS

## 2016-09-26 MED ORDER — CEFAZOLIN SODIUM-DEXTROSE 2-4 GM/100ML-% IV SOLN
INTRAVENOUS | Status: AC
  Filled 2016-09-26: qty 100

## 2016-09-26 MED ORDER — TECHNETIUM TC 99M SULFUR COLLOID FILTERED
0.5000 | Freq: Once | INTRAVENOUS | Status: AC | PRN
Start: 2016-09-26 — End: 2016-09-26
  Administered 2016-09-26: 0.5 via INTRADERMAL

## 2016-09-26 MED ORDER — BUPIVACAINE HCL (PF) 0.5 % IJ SOLN
INTRAMUSCULAR | Status: AC
  Filled 2016-09-26: qty 30

## 2016-09-26 MED ORDER — PROPOFOL 10 MG/ML IV BOLUS
INTRAVENOUS | Status: DC | PRN
  Administered 2016-09-26: 120 mg via INTRAVENOUS
  Administered 2016-09-26: 20 mg via INTRAVENOUS

## 2016-09-26 MED ORDER — ONDANSETRON HCL 4 MG/2ML IJ SOLN
4.0000 mg | Freq: Once | INTRAMUSCULAR | Status: AC
  Administered 2016-09-26: 4 mg via INTRAVENOUS
  Filled 2016-09-26: qty 2

## 2016-09-26 MED ORDER — TECHNETIUM TC 99M SULFUR COLLOID FILTERED
0.5000 | Freq: Once | INTRAVENOUS | Status: AC | PRN
  Administered 2016-09-26: 0.5 via INTRADERMAL

## 2016-09-26 MED ORDER — SODIUM CHLORIDE 0.9 % IJ SOLN
INTRAMUSCULAR | Status: AC
Start: 2016-09-26 — End: 2016-09-26
  Filled 2016-09-26: qty 10

## 2016-09-26 MED ORDER — ROCURONIUM BROMIDE 100 MG/10ML IV SOLN
INTRAVENOUS | Status: DC | PRN
  Administered 2016-09-26: 5 mg via INTRAVENOUS
  Administered 2016-09-26: 25 mg via INTRAVENOUS

## 2016-09-26 SURGICAL SUPPLY — 34 items
ADH SKN CLS APL DERMABOND .7 (GAUZE/BANDAGES/DRESSINGS) ×1
APPLIER CLIP 9.375 SM OPEN (CLIP) ×2
APR CLP SM 9.3 20 MLT OPN (CLIP) ×1
BAG HAMPER (MISCELLANEOUS) ×2 IMPLANT
BLADE SURG 15 STRL LF DISP TIS (BLADE) ×1 IMPLANT
BLADE SURG 15 STRL SS (BLADE) ×2
CLIP APPLIE 9.375 SM OPEN (CLIP) ×1 IMPLANT
CONT SPEC 4OZ CLIKSEAL STRL BL (MISCELLANEOUS) ×2 IMPLANT
COVER PROBE W GEL 5X96 (DRAPES) ×2 IMPLANT
COVER SURGICAL LIGHT HANDLE (MISCELLANEOUS) ×2 IMPLANT
DECANTER SPIKE VIAL GLASS SM (MISCELLANEOUS) ×2 IMPLANT
DERMABOND ADVANCED (GAUZE/BANDAGES/DRESSINGS) ×1
DERMABOND ADVANCED .7 DNX12 (GAUZE/BANDAGES/DRESSINGS) ×1 IMPLANT
ELECT REM PT RETURN 9FT ADLT (ELECTROSURGICAL) ×2
ELECTRODE REM PT RTRN 9FT ADLT (ELECTROSURGICAL) ×1 IMPLANT
GAUZE SPONGE 4X4 16PLY XRAY LF (GAUZE/BANDAGES/DRESSINGS) ×1 IMPLANT
GLOVE BIOGEL PI IND STRL 7.0 (GLOVE) ×1 IMPLANT
GLOVE BIOGEL PI INDICATOR 7.0 (GLOVE) ×1
GLOVE SURG SS PI 7.5 STRL IVOR (GLOVE) ×2 IMPLANT
GOWN STRL REUS W/ TWL LRG LVL3 (GOWN DISPOSABLE) ×1 IMPLANT
GOWN STRL REUS W/ TWL XL LVL3 (GOWN DISPOSABLE) ×1 IMPLANT
GOWN STRL REUS W/TWL LRG LVL3 (GOWN DISPOSABLE) ×2
GOWN STRL REUS W/TWL XL LVL3 (GOWN DISPOSABLE) ×2
INST SET MINOR GENERAL (KITS) ×2 IMPLANT
KIT ROOM TURNOVER APOR (KITS) ×2 IMPLANT
NS IRRIG 1000ML POUR BTL (IV SOLUTION) ×2 IMPLANT
PACK MINOR (CUSTOM PROCEDURE TRAY) ×2 IMPLANT
PAD ARMBOARD 7.5X6 YLW CONV (MISCELLANEOUS) ×3 IMPLANT
SET BASIN LINEN APH (SET/KITS/TRAYS/PACK) ×2 IMPLANT
SUT SILK 2 0 SH (SUTURE) ×3 IMPLANT
SUT VIC AB 3-0 SH 27 (SUTURE) ×4
SUT VIC AB 3-0 SH 27X BRD (SUTURE) ×1 IMPLANT
SUT VIC AB 4-0 PS2 27 (SUTURE) ×3 IMPLANT
SYRINGE 10CC LL (SYRINGE) ×2 IMPLANT

## 2016-09-26 NOTE — Discharge Instructions (Signed)
Partial Mastectomy With Axillary Lymph Node Dissection, Care After Refer to this sheet in the next few weeks. These instructions provide you with information about caring for yourself after your procedure. Your health care provider may also give you more specific instructions. Your treatment has been planned according to current medical practices, but problems sometimes occur. Call your health care provider if you have any problems or questions after your procedure. WHAT TO EXPECT AFTER THE PROCEDURE After your procedure, it is common to have:   Breast swelling.  Breast tenderness.  Stiffness in your arm or shoulder.  A change in the shape and feel of your breast. HOME CARE INSTRUCTIONS Bathing  Take sponge baths until your health care provider says that you can start showering or bathing.  Do not take baths, swim, or use a hot tub until your health care provider approves. Incision Care  There are many different ways to close and cover an incision, including stitches, skin glue, and adhesive strips. Follow your health care provider's instructions about:  Incision care.  Bandage (dressing) changes and removal.  Incision closure removal.  Check your incision area every day for signs of infection. Watch for:  Redness, swelling, or pain.  Fluid, blood, or pus.  If you were sent home with a surgical drain in place, follow your health care provider's instructions for emptying it. Activities  Return to your normal activities as directed by your health care provider.  Avoid strenuous exercise.  Be careful to avoid any activities that could cause an injury to your arm on the side of your surgery.  Do not lift anything that is heavier than 10 lb (4.5 kg). Avoid lifting with the arm that is on the side of your surgery.  Do not carry heavy objects on your shoulder.  After your drain is removed, you should perform exercises to keep your arm from getting stiff and swollen. Talk with  your health care provider about which exercises are safe for you. General Instructions  Take medicines only as directed by your health care provider.  Keep your dressing clean and dry.  You may eat what you usually do.  Wear a supportive bra as directed by your health care provider.  Keep your arm elevated when at rest.  Do not wear tight jewelry on your arm, wrist, or fingers on the side of your surgery.  Always let your health care providers know that you have had a lymph node dissection in your arm. This is important information to share before you are involved in certain procedures, such as giving blood or having your blood pressure taken. SEEK MEDICAL CARE IF:  You have a fever.  Your pain medicine is not working.  Your swelling, weakness, or numbness in your arm has not improved after a few weeks.  You have new swelling in your breast or arm.  You have redness, swelling, or pain in your incision area.  You have fluid, blood, or pus coming from your incision. SEEK IMMEDIATE MEDICAL CARE IF:  You have very bad pain in your breast or arm.  You have chest pain.  You have difficulty breathing.   This information is not intended to replace advice given to you by your health care provider. Make sure you discuss any questions you have with your health care provider.   Document Released: 06/26/2004 Document Revised: 03/29/2015 Document Reviewed: 07/28/2014 Elsevier Interactive Patient Education 2016 [place] Anesthesia, Adult, Care After Refer to this sheet in the next few  weeks. These instructions provide you with information on caring for yourself after your procedure. Your health care provider may also give you more specific instructions. Your treatment has been planned according to current medical practices, but problems sometimes occur. Call your health care provider if you have any problems or questions after your procedure. WHAT TO EXPECT AFTER THE  PROCEDURE After the procedure, it is typical to experience:  Sleepiness.  Nausea and vomiting. HOME CARE INSTRUCTIONS  For the first 24 hours after general anesthesia:  Have a responsible person with you.  Do not drive a car. If you are alone, do not take public transportation.  Do not drink alcohol.  Do not take medicine that has not been prescribed by your health care provider.  Do not sign important papers or make important decisions.  You may resume a normal diet and activities as directed by your health care provider.  Change bandages (dressings) as directed.  If you have questions or problems that seem related to general anesthesia, call the hospital and ask for the anesthetist or anesthesiologist on call. SEEK MEDICAL CARE IF:  You have nausea and vomiting that continue the day after anesthesia.  You develop a rash. SEEK IMMEDIATE MEDICAL CARE IF:   You have difficulty breathing.  You have chest pain.  You have any allergic problems.   This information is not intended to replace advice given to you by your health care provider. Make sure you discuss any questions you have with your health care provider.   Document Released: 02/18/2001 Document Revised: 12/03/2014 Document Reviewed: 03/12/2012 Elsevier Interactive Patient Education [employer].

## 2016-09-26 NOTE — Anesthesia Preprocedure Evaluation (Signed)
Anesthesia Evaluation  Patient identified by MRN, date of birth, ID band Patient awake    Reviewed: Allergy & Precautions, H&P , NPO status , Patient's Chart, lab work & pertinent test results  Airway Mallampati: II  TM Distance: >3 FB Neck ROM: Full    Dental  (+) Teeth Intact   Pulmonary neg pulmonary ROS,    breath sounds clear to auscultation       Cardiovascular hypertension, Pt. on medications  Rhythm:Regular Rate:Normal     Neuro/Psych    GI/Hepatic negative GI ROS, GERD  Medicated and Poorly Controlled,  Endo/Other    Renal/GU      Musculoskeletal   Abdominal   Peds  Hematology   Anesthesia Other Findings   Reproductive/Obstetrics                             Anesthesia Physical Anesthesia Plan  ASA: II  Anesthesia Plan: General   Post-op Pain Management:    Induction: Intravenous, Rapid sequence and Cricoid pressure planned  Airway Management Planned: Oral ETT  Additional Equipment:   Intra-op Plan:   Post-operative Plan: Extubation in OR  Informed Consent: I have reviewed the patients History and Physical, chart, labs and discussed the procedure including the risks, benefits and alternatives for the proposed anesthesia with the patient or authorized representative who has indicated his/her understanding and acceptance.     Plan Discussed with:   Anesthesia Plan Comments:         Anesthesia Quick Evaluation

## 2016-09-26 NOTE — Op Note (Signed)
Patient:  Kimberly Roman  DOB:  [birth year]  MRN:  [id number]   Preop Diagnosis:  Right breast carcinoma  Postop Diagnosis:  Same  Procedure:  Right partial mastectomy, sentinel lymph node biopsy of right axilla  Surgeon:  Aviva Signs, M.D.  Anes:  Gen. endotracheal  Indications:  Patient is a 60 year old black female who was recently found to have an invasive ductal carcinoma. After extensive discussion with the patient, she has elected to proceed with a right partial mastectomy with sentinel lymph node biopsy, possible axillary dissection. The risks and benefits of the procedures including bleeding, infection, pain, nerve injury, and the possibility of needing further breast excision were fully explained to the patient, who gave informed consent.  Procedure note:  The patient was placed the supine position. She had already been injected with radioactive nuclide in the preoperative area. After induction of general endotracheal anesthesia, 4 mL of blue dye was injected in the upper, outer quadrant along the subdermal plane. This was then massaged into the breast for 5 minutes. The right breast and axilla were then prepped and draped using usual sterile technique with DuraPrep. Surgical site confirmation was performed.  The sentinel lymph node biopsy was first performed. Using a neoprobe, a small incision was made in the right axilla down to the soft tissue. 2 blue hot lymph nodes were found. These were resected and any bleeding was controlled using small clips. Both were sent to pathology for frozen section. Both lymph nodes were noted be negative for metastatic disease. The basin counts in the right axilla were less than 10% of the in vivo counts. 0.5% Sensorcaine was instilled in the surrounding wound. The subcutaneous layer was reapproximated using a 3-0 Vicryl interrupted suture. The skin was closed using a 4 Vicryl subcuticular suture. Dermabond was ultimately applied to this  incision.  Next, the right partial mastectomy was performed. The mass was located in the upper, outer quadrant of the right breast. It did extend towards the axilla. Grossly normal appearing tissue was dissected around the mass. A short suture was placed superiorly and a long suture placed laterally for orientation purposes. The specimen was sent to x-ray for specimen radiography. The previously placed clip was noted with to be within the specimen removed. The specimen was then sent to pathology further examination. A bleeding was controlled using Bovie electrocautery. The subcutaneous layer was reapproximated using a 3-0 Vicryl interrupted suture. The skin was closed using a 4-0 Vicryl subcuticular suture. Dermabond was then applied.  All tape and needle counts were correct at the end of the procedures. The patient was extubated in the operating room and transferred to PACU in stable condition.  Complications:  None  EBL:  25 mL  Specimen:  Sentinel lymph nodes 2, right axilla Right breast tissue, short suture superior, long suture lateral

## 2016-09-26 NOTE — Progress Notes (Signed)
Mickie Bail CNMT in to inject sentinel nodes right breast.

## 2016-09-26 NOTE — Interval H&P Note (Signed)
History and Physical Interval Note:  09/26/2016 8:19 AM  Kimberly Roman  has presented today for surgery, with the diagnosis of right breast cancer  The various methods of treatment have been discussed with the patient and family. After consideration of risks, benefits and other options for treatment, the patient has consented to  Procedure(s): PARTIAL MASTECTOMY WITH AXILLARY SENTINEL LYMPH NODE BIOPSY (Right) as a surgical intervention .  The patient's history has been reviewed, patient examined, no change in status, stable for surgery.  I have reviewed the patient's chart and labs.  Questions were answered to the patient's satisfaction.     Aviva Signs A

## 2016-09-26 NOTE — OR Nursing (Signed)
Nuclear Med injection completed

## 2016-09-26 NOTE — Anesthesia Postprocedure Evaluation (Signed)
Anesthesia Post Note  Patient: Kimberly Roman  Procedure(s) Performed: Procedure(s) (LRB): PARTIAL MASTECTOMY WITH AXILLARY SENTINEL LYMPH NODE BIOPSY (Right)  Patient location during evaluation: PACU Anesthesia Type: General Level of consciousness: awake Pain management: satisfactory to patient Vital Signs Assessment: post-procedure vital signs reviewed and stable Respiratory status: patient connected to nasal cannula oxygen Cardiovascular status: stable Anesthetic complications: no    Last Vitals:  Vitals:   09/26/16 1145 09/26/16 1200  BP: 135/65 133/74  Pulse: (!) 59 (!) 59  Resp: 16 13  Temp:      Last Pain:  Vitals:   09/26/16 1200  TempSrc:   PainSc: 7                  Katja Blue

## 2016-09-26 NOTE — Transfer of Care (Signed)
Immediate Anesthesia Transfer of Care Note  Patient: Kimberly Roman  Procedure(s) Performed: Procedure(s): PARTIAL MASTECTOMY WITH AXILLARY SENTINEL LYMPH NODE BIOPSY (Right)  Patient Location: PACU  Anesthesia Type:General  Level of Consciousness: sedated and patient cooperative  Airway & Oxygen Therapy: Patient Spontanous Breathing and non-rebreather face mask  Post-op Assessment: Report given to RN and Post -op Vital signs reviewed and stable  Post vital signs: Reviewed and stable  Last Vitals:  Vitals:   09/26/16 0940 09/26/16 0945  BP: 123/70 120/73  Pulse:    Resp: 20 19  Temp:      Last Pain:  Vitals:   09/26/16 0812  TempSrc: Oral  PainSc: 0-No pain         Complications: No apparent anesthesia complications

## 2016-09-26 NOTE — Anesthesia Procedure Notes (Signed)
Procedure Name: Intubation Date/Time: 09/26/2016 9:57 AM Performed by: Vista Deck Pre-anesthesia Checklist: Patient identified, Patient being monitored, Timeout performed, Emergency Drugs available and Suction available Patient Re-evaluated:Patient Re-evaluated prior to inductionOxygen Delivery Method: Circle System Utilized Preoxygenation: Pre-oxygenation with 100% oxygen Intubation Type: IV induction, Cricoid Pressure applied and Rapid sequence Ventilation: Mask ventilation without difficulty Laryngoscope Size: Miller and 2 Grade View: Grade I Tube type: Oral Tube size: 7.0 mm Number of attempts: 1 Airway Equipment and Method: Stylet Placement Confirmation: ETT inserted through vocal cords under direct vision,  positive ETCO2 and breath sounds checked- equal and bilateral Secured at: 21 cm Tube secured with: Tape Dental Injury: Teeth and Oropharynx as per pre-operative assessment

## 2016-09-28 ENCOUNTER — Encounter (HOSPITAL_COMMUNITY): Payer: Self-pay | Admitting: General Surgery

## 2016-10-03 LAB — TYPE AND SCREEN
ABO/RH(D): B POS
Antibody Screen: NEGATIVE

## 2016-10-11 ENCOUNTER — Encounter (HOSPITAL_COMMUNITY): Payer: BLUE CROSS/BLUE SHIELD | Attending: Hematology & Oncology | Admitting: Hematology & Oncology

## 2016-10-11 ENCOUNTER — Encounter (HOSPITAL_COMMUNITY): Payer: Self-pay | Admitting: Hematology & Oncology

## 2016-10-11 VITALS — BP 117/78 | HR 89 | Temp 98.3°F | Resp 16 | Wt 151.4 lb

## 2016-10-11 DIAGNOSIS — Z171 Estrogen receptor negative status [ER-]: Secondary | ICD-10-CM | POA: Diagnosis not present

## 2016-10-11 DIAGNOSIS — C50411 Malignant neoplasm of upper-outer quadrant of right female breast: Secondary | ICD-10-CM | POA: Diagnosis not present

## 2016-10-11 DIAGNOSIS — M457 Ankylosing spondylitis of lumbosacral region: Secondary | ICD-10-CM

## 2016-10-11 DIAGNOSIS — C50919 Malignant neoplasm of unspecified site of unspecified female breast: Secondary | ICD-10-CM

## 2016-10-11 DIAGNOSIS — K219 Gastro-esophageal reflux disease without esophagitis: Secondary | ICD-10-CM

## 2016-10-11 DIAGNOSIS — C50011 Malignant neoplasm of nipple and areola, right female breast: Secondary | ICD-10-CM

## 2016-10-11 NOTE — Patient Instructions (Addendum)
[place] at [facility] Discharge Instructions  RECOMMENDATIONS MADE BY THE CONSULTANT AND ANY TEST RESULTS WILL BE SENT TO YOUR REFERRING PHYSICIAN.    Exam and discussion by Dr Whitney Muse today You have triple negative breast cancer.  Stage 1. And since your tumor size was larger than 1 cm then you will also need chemotherapy. The order of treatment includes surgery, chemotherapy, then radiation. We will get you referred to have a port placed, this is where you will have your chemotherapy through. We will set you up for chemotherapy teaching.  10/24/2016 at 11:30 am You will have chemotherapy once every 3 weeks. Return to see the doctor 1 weeks after you have had your first treatment.  Please call the clinic if you have any questions or concerns.     Thank you for choosing [place] at [facility] to provide your oncology and hematology care.  To afford each patient quality time with our provider, please arrive at least 15 minutes before your scheduled appointment time.   Beginning January 23rd 2017 lab work for the [facility] will be done in the  Main lab at [facility] on 1st floor. If you have a lab appointment with the [facility] please come in thru the  Main Entrance and check in at the main information desk  You need to re-schedule your appointment should you arrive 10 or more minutes late.  We strive to give you quality time with our providers, and arriving late affects you and other patients whose appointments are after yours.  Also, if you no show three or more times for appointments you may be dismissed from the clinic at the providers discretion.     Again, thank you for choosing [facility].  Our hope is that these requests will decrease the amount of time that you wait before being seen by our physicians.       _____________________________________________________________  Should you have questions after  your visit to [facility], please contact our office at [phone] between the hours of 8:30 a.m. and 4:30 p.m.  Voicemails left after 4:30 p.m. will not be returned until the following business day.  For prescription refill requests, have your pharmacy contact our office.         Resources For Cancer Patients and their Caregivers ? American Cancer Society: Can assist with transportation, wigs, general needs, runs Look Good Feel Better.        616-430-0426 ? Cancer Care: Provides financial assistance, online support groups, medication/co-pay assistance.  1-800-813-HOPE 667-526-2487) ? [place] Assists [place] cancer patients and their families through emotional , educational and financial support.  470 765 4462 ? [place] DSS Where to apply for food stamps, Medicaid and utility assistance. 415-452-0787 ? RCATS: Transportation to medical appointments. 9374491675 ? Social Security Administration: May apply for disability if have a Stage IV cancer. 702-123-0216 518 137 2770 ? [employer], Disability and Transit Services: Assists with nutrition, care and transit needs. [place] Support Programs: @10RELATIVEDAYS @ > Cancer Support Group  2nd Tuesday of the month 1pm-2pm, Journey Room  > Creative Journey  3rd Tuesday of the month 1130am-1pm, Journey Room  > Look Good Feel Better  1st Wednesday of the month 10am-12 noon, Journey Room (Call American Cancer Society to register [phone])   [place] at [facility] Discharge Instructions  [place] TEST RESULTS  WILL BE SENT TO YOUR REFERRING PHYSICIAN.  Exam with Dr. Whitney Muse today. Anderson Malta, our nurse navigator, will work with you to get all of your appointments made to get you started for treatment.   Please see Amy as you leave today for appointments needed for follow up and for the echo  test that we are doing on your heart prior to starting treatment.   Please call us with any questions or concerns.    Thank you for choosing [place] at [facility] to provide your oncology and hematology care.  To afford each patient quality time with our provider, please arrive at least 15 minutes before your scheduled appointment time.   Beginning January 23rd 2017 lab work for the [facility] will be done in the  Main lab at [facility] on 1st floor. If you have a lab appointment with the [place] [facility] please come in thru the  Main Entrance and check in at the main information desk  You need to re-schedule your appointment should you arrive 10 or more minutes late.  We strive to give you quality time with our providers, and arriving late affects you and other patients whose appointments are after yours.  Also, if you no show three or more times for appointments you may be dismissed from the clinic at the providers discretion.     Again, thank you for choosing [facility].  Our hope is that these requests will decrease the amount of time that you wait before being seen by our physicians.       _____________________________________________________________  Should you have questions after your visit to [facility], please contact our office at [phone] between the hours of 8:30 a.m. and 4:30 p.m.  Voicemails left after 4:30 p.m. will not be returned until the following business day.  For prescription refill requests, have your pharmacy contact our office.         Resources For Cancer Patients and their Caregivers ? American Cancer Society: Can assist with transportation, wigs, general needs, runs Look Good Feel Better.        717-837-2366 ? Cancer Care: Provides financial assistance, online support groups, medication/co-pay assistance.  1-800-813-HOPE 878-430-8267) ? [place] Assists [place] cancer  patients and their families through emotional , educational and financial support.  712-324-0139 ? [place] DSS Where to apply for food stamps, Medicaid and utility assistance. (804) 530-2772 ? RCATS: Transportation to medical appointments. 6090477458 ? Social Security Administration: May apply for disability if have a Stage IV cancer. (640) 863-4229 (913) 458-7151 ? [employer], Disability and Transit Services: Assists with nutrition, care and transit needs. [place] Support Programs: @10RELATIVEDAYS @ > Cancer Support Group  2nd Tuesday of the month 1pm-2pm, Journey Room  > Creative Journey  3rd Tuesday of the month 1130am-1pm, Journey Room  > Look Good Feel Better  1st Wednesday of the month 10am-12 noon, Journey Room (Call [place] to register [phone])

## 2016-10-11 NOTE — Progress Notes (Signed)
Met with pt face to face.  Introduced myself and explain a little bit about my role as the patient navigator.  Pt given a card with all my information on it.  Told pt to call if they had any questions or concerns.  Pt verbalized understanding.   Went in with Dr Whitney Muse during initial visit.  Chemotherapy teaching set up for 10/24/2016.  Referral to Dr Arnoldo Morale for port placement.  Pt will begin treatment on 10/26/2016.  Pt will have 2d echo prior to starting treatment.  Pts mother was with her at the visit today.

## 2016-10-11 NOTE — Progress Notes (Signed)
Kimberly Roman  CONSULT NOTE  Patient Care Team: Rosita Fire, MD as PCP - General (Internal Medicine)  CHIEF COMPLAINTS/PURPOSE OF CONSULTATION:    Invasive ductal carcinoma of breast ([place])   08/13/2016 Mammogram    BI-RADS CATEGORY  0: Incomplete.      08/21/2016 Mammogram    BI-RADS CATEGORY  5: Highly suggestive of malignancy. Suspicious 1.2 cm mass 10 o'clock position right breast.      08/21/2016 Breast US    Targeted ultrasound is performed, showing a hypoechoic irregular mass with indistinct margins at 10 o'clock position 9 cm from the nipple measuring approximately 1.1 x 1.2 x 1.0 cm. No additional masses are seen in this region of the right breast on ultrasound.      08/28/2016 Procedure    Right needle core biopsy      09/26/2016 Procedure    Right upper outer quadrant lumpectomy by Dr. Arnoldo Morale      09/28/2016 Pathology Results    Invasive ductal carcinoma, grade 3, standing 1.7 cm. High-grade ductal carcinoma in situ with comedonecrosis. Invasive carcinoma in less than 0.1 cm of the superior medial margin focally. In situ carcinoma is less than 0.1 cm of superior medial margin and the posterior margin focally. No LVI. ER negative. PR negative. HER-2 negative. KI-67 90%.      09/28/2016 Cancer Staging    Invasive ductal carcinoma of breast [facility])   Staging form: Breast, AJCC 7th Edition   - Clinical stage from 09/28/2016: Stage IA (T1c, N0, M0) - Signed by Baird Cancer, PA-C on 10/22/2016      09/29/2016 Pathology Results    Invasive ductal carcinoma, grade 2/3. HER-2 negative. ER negative. PR negative. Ki-67 90%.               HISTORY OF PRESENTING ILLNESS:  Kimberly Roman 60 y.o. female is here because of stage I triple negative breast cancer. Cancer was found on routine mammography in September.. Patient was referred to Dr. Arnoldo Morale by Dr. Legrand Rams. Final pathology shows invasive ductal carcinoma, 1.7 cm in size, high grade, triple negative,  ki-67 at 90%.Kimberly Roman underwent a partial mastectomy with axillary sentinel lymph node biopsy on 09/26/2016.  Patient is accompanied by mother. She says she feels fine and has experienced no symptoms. She notes no pain. She denies feeling any mass in her breast prior to her mastectomy. She is post menopausal.  She has no children, she does not drink or smoke.  She presents today for further discussion of newly diagnosed triple negative carcinoma of the breast.    MEDICAL HISTORY:  Past Medical History:  Diagnosis Date  . Ankylosing spondylitis ([place])   . Arthritis   . Breast cancer ([place])   . Carpal tunnel syndrome   . Environmental allergies   . GERD (gastroesophageal reflux disease)   . Hyperlipemia   . Hypertension     SURGICAL HISTORY: Past Surgical History:  Procedure Laterality Date  . ABDOMINAL HYSTERECTOMY    . CARPAL TUNNEL RELEASE Right 04/27/2013   Procedure: RIGHT CARPAL TUNNEL RELEASE;  Surgeon: Carole Civil, MD;  Location: [facility];  Service: Orthopedics;  Laterality: Right;  . COLONOSCOPY N/A 01/20/2013   Procedure: COLONOSCOPY;  Surgeon: Danie Binder, MD;  Location: [facility] END[facility];  Service: Endoscopy;  Laterality: N/A;  9:30 AM  . PARTIAL MASTECTOMY WITH AXILLARY SENTINEL LYMPH NODE BIOPSY Right 09/26/2016   Procedure: PARTIAL MASTECTOMY WITH AXILLARY SENTINEL LYMPH NODE BIOPSY;  Surgeon: Aviva Signs,  MD;  Location: [facility];  Service: General;  Laterality: Right;    SOCIAL HISTORY: Social History   Social History  . Marital status: Single    Spouse name: N/A  . Number of children: N/A  . Years of education: N/A   Occupational History  . Not on file.   Social History Main Topics  . Smoking status: Never Smoker  . Smokeless tobacco: Never Used  . Alcohol use No  . Drug use: No  . Sexual activity: Not on file   Other Topics Concern  . Not on file   Social History Narrative  . No narrative on file  2 brothers.  Father passed at 94 Not married,  no children She is a counseling agent. She enjoys crochet and reading Nonsmoker, no EtOH Born in [place]: Family History  Problem Relation Age of Onset  . Arthritis    . Hypertension Mother   . Hypertension Father   . Colon cancer Neg Hx     ALLERGIES:  is allergic to lipitor [atorvastatin calcium].  MEDICATIONS:  Current Outpatient Prescriptions  Medication Sig Dispense Refill  . acetaminophen (TYLENOL) 650 MG CR tablet Take 650 mg by mouth every 8 (eight) hours as needed for pain.    Marland Kitchen amLODipine (NORVASC) 5 MG tablet Take 5 mg by mouth every morning.    . Artificial Tear Solution (SOOTHE XP) SOLN Apply 1 drop to eye.    . diphenhydramine-acetaminophen (TYLENOL PM) 25-500 MG TABS tablet Take 1 tablet by mouth at bedtime as needed.    Marland Kitchen lisinopril-hydrochlorothiazide (PRINZIDE,ZESTORETIC) 20-12.5 MG tablet Take 1 tablet by mouth every morning.  2  . omeprazole (PRILOSEC) 20 MG capsule Take 1 capsule by mouth every morning.  0  . HYDROcodone-acetaminophen (NORCO) 5-325 MG tablet Take 1 tablet by mouth every 4 (four) hours as needed for moderate pain. (Patient not taking: Reported on 10/11/2016) 50 tablet 0   No current facility-administered medications for this visit.     Review of Systems  Constitutional: Negative.   HENT: Negative.   Eyes: Negative.   Respiratory: Negative.   Cardiovascular: Negative.   Gastrointestinal: Negative.   Genitourinary: Negative.   Musculoskeletal: Negative.   Skin: Negative.   Neurological: Negative.   Endo/Heme/Allergies: Negative.   Psychiatric/Behavioral: Negative.   All other systems reviewed and are negative. 14 point ROS was done and is otherwise as detailed above or in HPI   PHYSICAL EXAMINATION: ECOG PERFORMANCE STATUS: 0 - Asymptomatic  Vitals:   10/11/16 1547  BP: 117/78  Pulse: 89  Resp: 16  Temp: 98.3 F (36.8 C)   Filed Weights   10/11/16 1547  Weight: 151 lb 6.4 oz (68.7 kg)      Physical Exam  Constitutional: She is oriented to person, place, and time and well-developed, well-nourished, and in no distress.  HENT:  Head: Normocephalic and atraumatic.  Mouth/Throat: Oropharynx is clear and moist. No oropharyngeal exudate.  Eyes: Conjunctivae and EOM are normal. Pupils are equal, round, and reactive to light. No scleral icterus.  Neck: Normal range of motion. Neck supple.  Cardiovascular: Normal rate, regular rhythm and normal heart sounds.   No murmur heard. Pulmonary/Chest: Effort normal and breath sounds normal. No respiratory distress. She has no wheezes. She has no rales.  Abdominal: Soft. Bowel sounds are normal. She exhibits no distension and no mass. There is no tenderness. There is no rebound and no guarding.  Musculoskeletal: Normal range of motion. She exhibits no edema.  Lymphadenopathy:    She has no cervical adenopathy.  Neurological: She is alert and oriented to person, place, and time. No cranial nerve deficit. Gait normal.  Skin: Skin is warm and dry.  Psychiatric: Mood, memory, affect and judgment normal.  Nursing note and vitals reviewed. R breast well healing.   LABORATORY DATA:  I have reviewed the data as listed Lab Results  Component Value Date   WBC 8.9 09/24/2016   HGB 12.9 09/26/2016   HCT 38.0 09/26/2016   MCV 91.1 09/24/2016   PLT 302 09/24/2016   CMP     Component Value Date/Time   NA 144 09/26/2016 0802   K 4.0 09/26/2016 0802   CL 102 09/24/2016 1528   CO2 29 09/24/2016 1528   GLUCOSE 87 09/26/2016 0802   BUN 10 09/24/2016 1528   CREATININE 0.73 09/24/2016 1528   CALCIUM 9.5 09/24/2016 1528   PROT 7.7 09/24/2016 1528   ALBUMIN 4.4 09/24/2016 1528   AST 17 09/24/2016 1528   ALT 15 09/24/2016 1528   ALKPHOS 79 09/24/2016 1528   BILITOT 0.6 09/24/2016 1528   GFRNONAA >60 09/24/2016 1528   GFRAA >60 09/24/2016 1528    PATHOLOGY:    RADIOGRAPHIC STUDIES: I have personally reviewed the radiological images  as listed and agreed with the findings in the report. No results found. Study Result   CLINICAL DATA:  History of breast malignancy, hypertension, and hyperlipidemia.  EXAM: CHEST  2 VIEW  COMPARISON:  No recent studies in PACs  FINDINGS: The lungs are mildly hyperinflated and clear. The heart and pulmonary vascularity are normal. The mediastinum is normal in width. There is no pleural effusion. No pulmonary parenchymal nodules or masses are observed. No mediastinal or hilar lymphadenopathy is demonstrated. The bony thorax exhibits no acute abnormality.  IMPRESSION: Mild hyperinflation consistent with COPD or reactive airway disease. No acute cardiopulmonary abnormality.   Electronically Signed   By: David  Martinique M.D.   On: 09/25/2016 07:07    Study Result   CLINICAL DATA:  Mass right breast identified on recent 2D screening mammogram.  EXAM: 2D DIGITAL DIAGNOSTIC RIGHT MAMMOGRAM WITH CAD AND ADJUNCT TOMO  ULTRASOUND RIGHT BREAST  COMPARISON:  Previous exam(s).  ACR Breast Density Category b: There are scattered areas of fibroglandular density.  FINDINGS: The whole breast CC and MLO views including tomography are performed. There is a spiculated mass in the posterior third of the upper outer quadrant of the right breast.  Mammographic images were processed with CAD.  On physical exam, no definite mass is palpated in the upper-outer quadrant of the right breast.  Targeted ultrasound is performed, showing a hypoechoic irregular mass with indistinct margins at 10 o'clock position 9 cm from the nipple measuring approximately 1.1 x 1.2 x 1.0 cm. No additional masses are seen in this region of the right breast on ultrasound.  Ultrasound of the right axilla is negative for lymphadenopathy.  IMPRESSION: Suspicious 1.2 cm mass 10 o'clock position right breast.  RECOMMENDATION: Ultrasound-guided core needle biopsy is recommended and has  been discussed with the patient. Biopsy has been scheduled for August 28, 2016 at 1 o'clock p.m.  I have discussed the findings and recommendations with the patient. Results were also provided in writing at the conclusion of the visit. If applicable, a reminder letter will be sent to the patient regarding the next appointment.  BI-RADS CATEGORY  5: Highly suggestive of malignancy.   Electronically Signed   By: Audelia Acton.D.  On: 08/21/2016 15:21    ASSESSMENT & PLAN:  Triple negative carcinoma of R breast Stage 1 Ankylosing spondylitis GERD  Discussed with the patient, the details of pathology including the type of breast cancer,the clinical staging, the significance of ER, PR and HER-2/neu receptors and the implications for treatment. After reviewing the pathology in detail, we proceeded to discuss the different treatment options between radiation, chemotherapy. We reviewed the NCCN guidelines in detail. I explained the significance of ER- PR- disease to the patient in detail. She was provided reading information from the NCCN and our breast cancer navigation book. She was provided with a copy of her pathology report. Details are noted above.   Chemotherapy Counseling: I discussed the risks and benefits of chemotherapy including the risks of nausea/ vomiting, risk of infection from low WBC count, fatigue due to chemo or anemia, bruising or bleeding due to low platelets, mouth sores, loss/ change in taste and decreased appetite. Liver and kidney function will be monitored through out chemotherapy as abnormalities in liver and kidney function may be a side effect of treatment. Cardiac dysfunction due to Adriamycin was discussed in detail. Risk of permanent bone marrow dysfunction due to chemo were also discussed.  Plan: 1. Port placement  2. Echocardiogram 3. Chemotherapy class  I have an ordered an echo.   Kimberly Roman met Anderson Malta, our patient navigator today.   Follow up  with either me or [name], PA-C after the holidays.   ORDERS PLACED FOR THIS ENCOUNTER: Orders Placed This Encounter  Procedures  . Ambulatory referral to Social Work    Referral Priority:   Routine    Referral Type:   Consultation    Referral Reason:   Specialty Services Required    Number of Visits Requested:   1  . Ambulatory referral to Social Work    Referral Priority:   Routine    Referral Type:   Consultation    Referral Reason:   Specialty Services Required    Number of Visits Requested:   1  . Ambulatory referral to Social Work    Referral Priority:   Routine    Referral Type:   Consultation    Referral Reason:   Specialty Services Required    Number of Visits Requested:   1  . ECHOCARDIOGRAM COMPLETE    Standing Status:   Future    Number of Occurrences:   1    Standing Expiration Date:   01/11/2018    Order Specific Question:   Where should this test be performed    Answer:   [facility]    Order Specific Question:   Complete or Limited study?    Answer:   Complete    Order Specific Question:   Does the patient have a known history of hypersensitivity to Perflutren (aka Scientist, research (medical) for echocardiograms - CHECK ALLERGIES)    Answer:   No    Order Specific Question:   ADMINISTER PERFLUTERN    Answer:   ADMINISTER PERFLUTREN    Order Specific Question:   Expected Date:    Answer:   1 week    Order Specific Question:   Reason for exam-Echo    Answer:   Chemotherapy evaluation  v87.41 / v58.11    This document serves as a record of services personally performed by Ancil Linsey, MD. It was created on her behalf by Elmyra Ricks, a trained medical scribe. The creation of this record is based on the scribe's personal observations and  the provider's statements to them. This document has been checked and approved by the attending provider.  I have reviewed the above documentation for accuracy and completeness and I agree with the above.   This note was  electronically signed.    Molli Hazard, MD   10/11/2016 4:25 PM

## 2016-10-17 NOTE — Patient Instructions (Addendum)
[facility]   CHEMOTHERAPY INSTRUCTIONS  Stage 1 invasive ductal carcinoma of right breast, upper outer quadrant, HER2 NEGATIVE, ER/PR NEGATIVE.  We are going to treat you with adriamycin and cytoxan every 2 weeks for 4 cycles.  1 cycle is 2 weeks.  Followed by 12 weekly cycles of taxol.  This is with curative intent.   You will see the doctor regularly throughout treatment.  We monitor your lab work prior to every treatment.  The doctor monitors your response to treatment by the way you are feeling, your blood work, and scans periodically.   You will receive the following premedications prior to each chemotherapy treatment: Premeds: Aloxi - high powered nausea/vomiting prevention medication used for chemotherapy patients. Emend - high powered nausea/vomiting prevention medication used for chemotherapy patients. Dexamethasone - steroid - given to reduce the risk of you having an allergic type reaction to the chemotherapy. Dex can cause you to feel energized, nervous/anxious/jittery, make you have trouble sleeping, and/or make you feel hot/flushed in the face/neck and/or look pink/red in the face/neck. These side effects will pass as the Dex wears off. (takes 20 minutes to infuse)   POTENTIAL SIDE EFFECTS OF TREATMENT:  Cyclophosphamide (Generic Name) Other Names: Cytoxan, Neosar  About This Drug Cyclophosphamide is a drug used to treat cancer. It is given in the vein (IV) or by mouth.  Takes 30 minutes for this drug to infuse.  Possible Side Effects (More Common) . Nausea and throwing up (vomiting). These symptoms may happen within a few hours after your treatment and may last up to 72 hours. Medicines are available to stop or lessen these side effects. . Bone marrow depression. This is a decrease in the number of white blood cells, red blood cells, and platelets. This may raise your risk of infection, make you tired and weak (fatigue), and raise your risk of  bleeding. . Hair loss: You may notice hair getting thin. Some patients lose their hair. Hair loss is often complete scalp hair loss and can involve loss of eyebrows, eyelashes, and pubic hair. You may notice this a few days or weeks after treatment has started. Most often hair loss is temporary; your hair should grow back when treatment is done. . Decreased appetite (decreased hunger) . Blurred vision . Soreness of the mouth and throat. You may have red areas, white patches, or sores that hurt. . Effects on the bladder. This drug may cause irritation and bleeding in the bladder. You may have blood in your urine. To help stop this, you will get extra fluids to help you pass more urine. You may get a drug called mesna, which helps to decrease irritation and bleeding. You may also get a medicine to help you pass more urine. You may have a catheter (tube) placed in your bladder so that your bladder will be washed with this drug.  Possible Side Effects (Less Common) . Darkening of the skin or nails . Metallic taste in the mouth . Changes in lung tissue may happen with large amounts of this drug. These changes may not last forever, and your lung tissue may go back to normal. Sometimes these changes may not be seen for many years. You may get a cough or have trouble catching your breath.  Allergic Reactions   Serious allergic reactions including anaphylaxis are rare. While you are getting this drug in your vein (IV), tell your nurse right away if you have any of these symptoms of an  allergic reaction: . Trouble catching your breath . Feeling like your tongue or throat are swelling . Feeling your heart beat quickly or in a not normal way (palpitations) . Feeling dizzy or lightheaded . Flushing, itching, rash, and/or hives Treating Side Effects . Drink 6-8 cups of fluids each day unless your doctor has told you to limit your fluid intake due to some other health problem. A cup is 8 ounces of fluid. If you  throw up or have loose bowel movements you should drink more fluids so that you do not become dehydrated (lack water in the body due to losing too much fluid). . Ask your doctor or nurse about medicine that is available to help stop or lessen nausea or throwing up. . Mouth care is very important. Your mouth care should consist of routine, gentle cleaning of your teeth or dentures and rinsing your mouth with a mixture of 1/2 teaspoon of salt in 8 ounces of water or  teaspoon of baking soda in 8 ounces of water. This should be done at least after each meal and at bedtime. . If you have mouth sores, avoid mouthwash that has alcohol. Also avoid alcohol and smoking because they can bother your mouth and throat. . Talk with your nurse about getting a wig before you lose your hair. Also, call the [facility] at 800-ACS-2345 to find out information about the " Look Good.Marland KitchenMarland KitchenFeel Better" program close to where you live. It is a free program where women undergoing chemotherapy learn about wigs, turbans and scarves as well as makeup techniques and skin and nail care.  Important Information . Whenever you tell a doctor or nurse your health history, always tell them that you have received cyclophosphamide in the past. . If you take this drug by mouth swallow the medicine whole. Do not chew, break or crush it. . You can take the medicine with or without food. If you have nausea, take it with food. Do not take the pills at bedtime.  Food and Drug Interactions There are no known interactions of cyclophosphamide with food. This drug may interact with other medicines. Tell your doctor and pharmacist about all the medicines and dietary supplements (vitamins, minerals, herbs and others) that you are taking at this time. The safety and use of dietary supplements and alternative diets are often not known. Using these might affect your cancer or interfere with your treatment. Until more is known, you should not  use dietary supplements or alternative diets without your cancer doctor's help.  When to Call the Doctor Call your doctor or nurse right away if you have any of these symptoms: . Fever of 100.5 F (38 C) or higher . Chills . Bleeding or bruising that is not normal . Blurred vision or other changes in eyesight . Pain when passing urine; blood in urine . Pain in your lower back or side . Wheezing or trouble breathing . Swelling of legs, ankles, or feet . Feeling dizzy or lightheaded . Feeling confused or agitated . Signs of liver problems: dark urine, pale bowel movements, bad stomach pain, feeling very tired and weak, unusual itching, or yellowing of the eyes or skin . Unusual thirst or passing urine often . Nausea that stops you from eating or drinking . Throwing up more than 3 times a day  Call your doctor or nurse as soon as possible if any of these symptoms happen: . Pain in your mouth or throat that makes it hard to eat or  drink . Nausea not relieved by prescribed medicines  Sexual Problems and Reproductive Concerns . Infertility warning: Sexual problems and reproduction concerns may happen. In both men and women, this drug may affect your ability to have children. This cannot be determined before your treatment. Talk with your doctor or nurse if you plan to have children. Ask for information on sperm or egg banking. . In men, this drug may interfere with your ability to make sperm, but it should not change your ability to have sexual relations. . In women, menstrual bleeding may become irregular or stop while you are getting this drug. Do not assume that you cannot become pregnant if you do not have a menstrual period. . Women may go through signs of menopause (change of life) like vaginal dryness or itching. Vaginal lubricants can be used to lessen vaginal dryness, itching, and pain during sexual relations. . Genetic counseling is available for you to talk about the effects of this  drug therapy on future pregnancies. Also, a genetic counselor can look at the possible risk of problems in the unborn baby due to this medicine if an exposure happens during pregnancy. . Pregnancy warning: This drug may have harmful effects on the unborn child, so effective methods of birth control should be used during your cancer treatment. . Breast feeding warning: Women should not breast feed during treatment because this drug could enter the breast milk and badly harm a breast feeding baby   Doxorubicin (Generic Name) Other Names: Adriamycin, hydroxyl daunorubicin  About This Drug Doxorubicin is a drug used to treat cancer. This drug is given in the vein (IV).  This drug is an IV push over about 5-10 minutes.    Possible Side Effects (More Common) . Bone marrow depression. This is a decrease in the number of white blood cells, red blood cells, and platelets. This may raise your risk of infection, make you tired and weak (fatigue), and raise your risk of bleeding. . Hair loss: Hair loss is often complete scalp hair loss and can involve loss of eyebrows, eyelashes, and pubic hair. You may notice this a few days or weeks after treatment has started. Most often hair loss is temporary; your hair should grow back when treatment is done. . Nausea and throwing up (vomiting). These symptoms may happen within a few hours after your treatment and may last up to 24 hours. Medicines are available to stop or lessen these side effects. . Soreness of the mouth and throat. You may have red areas, white patches, or sores that hurt. . Change in the color of your urine to pink or red. This color change will go away in one to two days. . Effects on the heart: This drug can weaken the heart and lower heart function. Your heart function will be checked as needed. You may have trouble catching your breath, mainly during activities. You may also have trouble breathing while lying down, and have swelling in your  ankles. . Sensitivity to light (photosensitivity). Photosensitivity means that you may become more sensitive to the effects of the sun, sun lamps, and tanning beds. Your eyes may water more, mostly in bright light. . Metallic taste in the mouth: This may change the taste of food and drinks . Decreased appetite (decreased hunger) . Darkening of the skin or nails . Weakness that interferes with your daily activities  Possible Side Effects (Less Common) . Skin and tissue irritation may involve redness, pain, warmth, or swelling at the IV  site. This happens if the drug leaks out of the vein and into nearby tissue. . Changes in your liver function. Your doctor will check your liver function as needed. . This drug may cause an increased risk of developing a second cancer  Allergic Reaction Serious allergic reactions, including anaphylaxis are rare. While you are getting this drug in your vein (IV), tell your nurse right away if you have any of these symptoms of an allergic reaction: . Trouble catching your breath . Feeling like your tongue or throat are swelling . Feeling your heart beat quickly or in a not normal way (palpitations) . Feeling dizzy or lightheaded . Flushing, itching, rash, and/or hives  Treating Side Effects . Drink 6-8 cups of fluids every day unless your doctor has told you to limit your fluid intake due to some other health problem. A cup is 8 ounces of fluid. If you vomit or have diarrhea, you should drink more fluids so that you do not become dehydrated (lack water in the body due to losing too much fluid). . Ask your doctor or nurse about medicine that is available to help stop or lessen nausea, throwing up, and/or loose bowel movements . Wear dark sunglasses and use sunscreen with SPF 30 or higher when you are outdoors even for a short time. Cover up when you are out in the sun. Wear wide-brimmed hats, long-sleeved shirts, and pants. Keep your neck, chest, and back  covered. . Mouth care is very important. Your mouth care should consist of routine, gentle cleaning of your teeth or dentures and rinsing your mouth with a mixture of 1/2 teaspoon of salt in 8 ounces of water or  teaspoon of baking soda in 8 ounces of water. This should be done at least after each meal and at bedtime. . If you have mouth sores, avoid mouthwash that has alcohol. Avoid alcohol and smoking because they can bother your mouth and throat. . Talk with your nurse about getting a wig before you lose your hair. Also, call the [facility] at 800-ACS-2345 to find out information about the "Look Good, Feel Better" program close to where you live. It is a free program where women getting chemotherapy can learn about wigs, turbans and scarves as well as makeup techniques and skin and nail care. . While you are getting this drug, please tell your nurse right away if you have any pain, redness, or swelling at the site of the IV infusion.  Food and Drug Interactions There are no known interactions of doxorubicin with food. This drug may interact with other medicines. Tell your doctor and pharmacist about all the medicines and dietary supplements (vitamins, minerals, herbs and others) that you are taking at this time. The safety and use of dietary supplements and alternative diets are often not known. Using these might affect your cancer or interfere with your treatment. Until more is known, you should not use dietary supplements or alternative diets without your cancer doctor's help.  When to Call the Doctor Call your doctor or nurse right away if you have any of these symptoms: . Fever of 100.5 F (38 C) or above . Chills . Easy bruising or bleeding . Wheezing or trouble breathing . Rash or itching . Feeling dizzy or lightheaded . Feeling that your heart is beating in a fast or not normal way (palpitations) . Loose bowel movements (diarrhea) more than 4 times a day or diarrhea with  weakness or feeling lightheaded . Nausea that  stops you from eating or drinking . Throwing up more than 3 times a day . Signs of liver problems: dark urine, pale bowel movements, bad stomach pain, feeling very tired and weak, unusual itching, or yellowing of the eyes or skin, . During the IV infusion, if you have pain, redness, or swelling at the site of the IV infusion, please tell your nurse right away  Call your doctor or nurse as soon as possible if any of these symptoms happen: . Decreased urine . Pain in your mouth or throat that makes it hard to eat or drink . Nausea and throwing up that is not relieved by prescribed medicines . Rash that is not relieved by prescribed medicines . Swelling of legs, ankles, or feet . Weight gain of 5 pounds in one week (fluid retention) . Lasting loss of appetite or rapid weight loss of five pounds in a week . Fatigue that interferes with your daily activities . Extreme weakness that interferes with normal activities  Sexual Problems and Reproduction Concerns . Infertility warning: Sexual problems and reproduction concerns may happen. In both men and women, this drug may affect your ability to have children. This cannot be determined before your treatment. Talk with your doctor or nurse if you plan to have children. Ask for information on sperm or egg banking. . In men, this drug may interfere with your ability to make sperm, but it should not change your ability to have sexual relations. . In women, menstrual bleeding may become irregular or stop while you are getting this drug. Do not assume that you cannot become pregnant if you do not have a menstrual period.   Paclitaxel (Taxol)  About This Drug Paclitaxel is a drug used to treat cancer. It is given in the vein (IV).  Possible Side Effects . Hair loss. Hair loss is often temporary, although with certain medicine, hair loss can sometimes be permanent. Hair loss may happen suddenly or gradually.  If you lose hair, you may lose it from your head, face, armpits, pubic area, chest, and/or legs. You may also notice your hair getting thin. . Swelling of your legs, ankles and/or feet (edema) . Flushing . Nausea and throwing up (vomiting) . Loose bowel movements (diarrhea) . Bone marrow depression. This is a decrease in the number of white blood cells, red blood cells, and platelets. This may raise your risk of infection, make you tired and weak (fatigue), and raise your risk of bleeding. . Effects on the nerves are called peripheral neuropathy. You may feel numbness, tingling, or pain in your hands and feet. It may be hard for you to button your clothes, open jars, or walk as usual. The effect on the nerves may get worse with more doses of the drug. These effects get better in some people after the drug is stopped but it does not get better in all people. . Changes in your liver function . Bone, joint and muscle pain . Abnormal EKG . Allergic reaction: Allergic reactions, including anaphylaxis are rare but may happen in some patients. Signs of allergic reaction to this drug may be swelling of the face, feeling like your tongue or throat are swelling, trouble breathing, rash, itching, fever, chills, feeling dizzy, and/or feeling that your heart is beating in a fast or not normal way. If this happens, do not take another dose of this drug. You should get urgent medical treatment. . Infection . Changes in your kidney function. Note: Each of the side effects  above was reported in 20% or greater of patients treated with paclitaxel. Not all possible side effects are included above.  Warnings and Precautions . Severe allergic reactions . Severe bone marrow depression  Treating Side Effects . To help with hair loss, wash with a mild shampoo and avoid washing your hair every day. . Avoid rubbing your scalp, instead, pat your hair or scalp dry . Avoid coloring your hair . Limit your use of hair  spray, electric curlers, blow dryers, and curling irons. . If you are interested in getting a wig, talk to your nurse. You can also call the [facility] at 800-ACS-2345 to find out information about the "Look Good, Feel Better" program close to where you live. It is a free program where women getting chemotherapy can learn about wigs, turbans and scarves as well as makeup techniques and skin and nail care. . Ask your doctor or nurse about medicines that are available to help stop or lessen diarrhea and/or nausea. . To help with nausea and vomiting, eat small, frequent meals instead of three large meals a day. Choose foods and drinks that are at room temperature. Ask your nurse or doctor about other helpful tips and medicine that is available to help or stop lessen these symptoms. . If you get diarrhea, eat low-fiber foods that are high in protein and calories and avoid foods that can irritate your digestive tracts or lead to cramping. Ask your nurse or doctor about medicine that can lessen or stop your diarrhea. . Mouth care is very important. Your mouth care should consist of routine, gentle cleaning of your teeth or dentures and rinsing your mouth with a mixture of 1/2 teaspoon of salt in 8 ounces of water or  teaspoon of baking soda in 8 ounces of water. This should be done at least after each meal and at bedtime. . If you have mouth sores, avoid mouthwash that has alcohol. Also avoid alcohol and smoking because they can bother your mouth and throat. . Drink plenty of fluids (a minimum of eight glasses per day is recommended). . Take your temperature as your doctor or nurse tells you, and whenever you feel like you may have a fever. . Talk to your doctor or nurse about precautions you can take to avoid infections and bleeding. . Be careful when cooking, walking, and handling sharp objects and hot liquids.  Food and Drug Interactions . There are no known interactions of paclitaxel with  food. . This drug may interact with other medicines. Tell your doctor and pharmacist about all the medicines and dietary supplements (vitamins, minerals, herbs and others) that you are taking at this time. . The safety and use of dietary supplements and alternative diets are often not known. Using these might affect your cancer or interfere with your treatment. Until more is known, you should not use dietary supplements or alternative diets without your cancer doctor's help.  When to Call the Doctor Call your doctor or nurse if you have any of the following symptoms and/or any new or unusual symptoms: . Fever of 100.5 F (38 C) or above . Chills . Redness, pain, warmth, or swelling at the IV site during the infusion . Signs of allergic reaction: swelling of the face, feeling like your tongue or throat are swelling, trouble breathing, rash, itching, fever, chills, feeling dizzy, and/or feeling that your heart is beating in a fast or not normal way . Feeling that your heart is beating in a fast  or not normal way (palpitations) . Weight gain of 5 pounds in one week (fluid retention) . Decreased urine or very dark urine . Signs of liver problems: dark urine, pale bowel movements, bad stomach pain, feeling very tired and weak, unusual itching, or yellowing of the eyes or skin . Heavy menstrual period that lasts longer than normal . Easy bruising or bleeding . Nausea that stops you from eating or drinking, and/or that is not relieved by prescribed medicines. . Loose bowel movements (diarrhea) more than 4 times a day or diarrhea with weakness or lightheadedness . Pain in your mouth or throat that makes it hard to eat or drink . Lasting loss of appetite or rapid weight loss of five pounds in a week . Signs of peripheral neuropathy: numbness, tingling, or decreased feeling in fingers or toes; trouble walking or changes in the way you walk; or feeling clumsy when buttoning clothes, opening jars, or other  routine activities . Joint and muscle pain that is not relieved by prescribed medicines . Extreme fatigue that interferes with normal activities . While you are getting this drug, please tell your nurse right away if you have any pain, redness, or swelling at the site of the IV infusion. . If you think you are pregnant.  Reproduction Warnings . Pregnancy warning: This drug may have harmful effects on the unborn child, it is recommended that effective methods of birth control should be used during your cancer treatment. Let your doctor know right away if you think you may be pregnant. . Breast feeding warning: Women should not breast feed during treatment because this drug could enter the breast milk and cause harm to a breast feeding baby.   SELF IMAGE NEEDS AND REFERRALS MADE: Referral to look good, feel better.   EDUCATIONAL MATERIALS GIVEN AND REVIEWED: Chemotherapy and you book given.  Nutrition book given.  Information on   SELF CARE ACTIVITIES WHILE ON CHEMOTHERAPY: Hydration Increase your fluid intake 48 hours prior to treatment and drink at least 8 to 12 cups (64 ounces) of water/decaff beverages per day after treatment. You can still have your cup of coffee or soda but these beverages do not count as part of your 8 to 12 cups that you need to drink daily. No alcohol intake.  Medications Continue taking your normal prescription medication as prescribed.  If you start any new herbal or new supplements please let us know first to make sure it is safe.  Mouth Care Have teeth cleaned professionally before starting treatment. Keep dentures and partial plates clean. Use soft toothbrush and do not use mouthwashes that contain alcohol. Biotene is a good mouthwash that is available at most pharmacies or may be ordered by calling [phone]. Use warm salt water gargles (1 teaspoon salt per 1 quart warm water) before and after meals and at bedtime. Or you may rinse with 2 tablespoons of  three-percent hydrogen peroxide mixed in eight ounces of water. If you are still having problems with your mouth or sores in your mouth please call the clinic. If you need dental work, please let Dr. Whitney Muse know before you go for your appointment so that we can coordinate the best possible time for you in regards to your chemo regimen. You need to also let your dentist know that you are actively taking chemo. We may need to do labs prior to your dental appointment.   Skin Care Always use sunscreen that has not expired and with SPF Nancy Fetter Protection Factor)  of 50 or higher. Wear hats to protect your head from the sun. Remember to use sunscreen on your hands, ears, face, & feet.  Use good moisturizing lotions such as udder cream, eucerin, or even Vaseline. Some chemotherapies can cause dry skin, color changes in your skin and nails.    . Avoid long, hot showers or baths. . Use gentle, fragrance-free soaps and laundry detergent. . Use moisturizers, preferably creams or ointments rather than lotions because the thicker consistency is better at preventing skin dehydration. Apply the cream or ointment within 15 minutes of showering. Reapply moisturizer at night, and moisturize your hands every time after you wash them.  Hair Loss (if your doctor says your hair will fall out)  . If your doctor says that your hair is likely to fall out, decide before you begin chemo whether you want to wear a wig. You may want to shop before treatment to match your hair color. . Hats, turbans, and scarves can also camouflage hair loss, although some people prefer to leave their heads uncovered. If you go bare-headed outdoors, be sure to use sunscreen on your scalp. . Cut your hair short. It eases the inconvenience of shedding lots of hair, but it also can reduce the emotional impact of watching your hair fall out. . Don't perm or color your hair during chemotherapy. Those chemical treatments are already damaging to hair and  can enhance hair loss. Once your chemo treatments are done and your hair has grown back, it's OK to resume dyeing or perming hair. With chemotherapy, hair loss is almost always temporary. But when it grows back, it may be a different color or texture. In older adults who still had hair color before chemotherapy, the new growth may be completely gray.  Often, new hair is very fine and soft.  Infection Prevention Please wash your hands for at least 30 seconds using warm soapy water. Handwashing is the #1 way to prevent the spread of germs. Stay away from sick people or people who are getting over a cold. If you develop respiratory systems such as green/yellow mucus production or productive cough or persistent cough let us know and we will see if you need an antibiotic. It is a good idea to keep a pair of gloves on when going into grocery stores/[employer] to decrease your risk of coming into contact with germs on the carts, etc. Carry alcohol hand gel with you at all times and use it frequently if out in public. If your temperature reaches 100.5 or higher please call the clinic and let us know.  If it is after hours or on the weekend please go to the ER if your temperature is over 100.5.  Please have your own personal thermometer at home to use.    Sex and bodily fluids If you are going to have sex, a condom must be used to protect the person that isn't taking chemotherapy. Chemo can decrease your libido (sex drive). For a few days after chemotherapy, chemotherapy can be excreted through your bodily fluids.  When using the toilet please close the lid and flush the toilet twice.  Do this for a few day after you have had chemotherapy.     Effects of chemotherapy on your sex life Some changes are simple and won't last long. They won't affect your sex life permanently. Sometimes you may feel: . too tired . not strong enough to be very active . sick or sore  . not in the mood .  anxious or low Your anxiety  might not seem related to sex. For example, you may be worried about the cancer and how your treatment is going. Or you may be worried about money, or about how you family are coping with your illness. These things can cause stress, which can affect your interest in sex. It's important to talk to your partner about how you feel. Remember - the changes to your sex life don't usually last long. There's usually no medical reason to stop having sex during chemo. The drugs won't have any long term physical effects on your performance or enjoyment of sex. Cancer can't be passed on to your partner during sex  Contraception It's important to use reliable contraception during treatment. Avoid getting pregnant while you or your partner are having chemotherapy. This is because the drugs may harm the baby. Sometimes chemotherapy drugs can leave a man or woman infertile.  This means you would not be able to have children in the future. You might want to talk to someone about permanent infertility. It can be very difficult to learn that you may no longer be able to have children. Some people find counselling helpful. There might be ways to preserve your fertility, although this is easier for men than for women. You may want to speak to a fertility expert. You can talk about sperm banking or harvesting your eggs. You can also ask about other fertility options, such as donor eggs. If you have or have had breast cancer, your doctor might advise you not to take the contraceptive pill. This is because the hormones in it might affect the cancer.  It is not known for sure whether or not chemotherapy drugs can be passed on through semen or secretions from the vagina. Because of this some doctors advise people to use a barrier method if you have sex during treatment. This applies to vaginal, anal or oral sex. Generally, doctors advise a barrier method only for the time you are actually having the treatment and for about a week  after your treatment. Advice like this can be worrying, but this does not mean that you have to avoid being intimate with your partner. You can still have close contact with your partner and continue to enjoy sex.  Animals If you have cats or birds we just ask that you not change the litter or change the cage.  Please have someone else do this for you while you are on chemotherapy.   Food Safety During and After Cancer Treatment Food safety is important for people both during and after cancer treatment. Cancer and cancer treatments, such as chemotherapy, radiation therapy, and stem cell/bone marrow transplantation, often weaken the immune system. This makes it harder for your body to protect itself from foodborne illness, also called food poisoning. Foodborne illness is caused by eating food that contains harmful bacteria, parasites, or viruses.  Foods to avoid Some foods have a higher risk of becoming tainted with bacteria. These include: Marland Kitchen Unwashed fresh fruit and vegetables, especially leafy vegetables that can hide dirt and other contaminants . Raw sprouts, such as alfalfa sprouts . Raw or undercooked beef, especially ground beef, or other raw or undercooked meat and poultry . Fatty, fried, or spicy foods immediately before or after treatment.  These can sit heavy on your stomach and make you feel nauseous. . Raw or undercooked shellfish, such as oysters. . Sushi and sashimi, which often contain raw fish.  . Unpasteurized beverages, such as unpasteurized fruit  juices, raw milk, raw yogurt, or cider . Undercooked eggs, such as soft boiled, over easy, and poached; raw, unpasteurized eggs; or foods made with raw egg, such as homemade raw cookie dough and homemade mayonnaise Simple steps for food safety Shop smart. . Do not buy food stored or displayed in an unclean area. . Do not buy bruised or damaged fruits or vegetables. . Do not buy cans that have cracks, dents, or bulges. . Pick up  foods that can spoil at the end of your shopping trip and store them in a cooler on the way home. Prepare and clean up foods carefully. . Rinse all fresh fruits and vegetables under running water, and dry them with a clean towel or paper towel. . Clean the top of cans before opening them. . After preparing food, wash your hands for 20 seconds with hot water and soap. Pay special attention to areas between fingers and under nails. . Clean your utensils and dishes with hot water and soap. Marland Kitchen Disinfect your kitchen and cutting boards using 1 teaspoon of liquid, unscented bleach mixed into 1 quart of water.   Dispose of old food. . Eat canned and packaged food before its expiration date (the "use by" or "best before" date). . Consume refrigerated leftovers within 3 to 4 days. After that time, throw out the food. Even if the food does not smell or look spoiled, it still may be unsafe. Some bacteria, such as Listeria, can grow even on foods stored in the refrigerator if they are kept for too long. Take precautions when eating out. . At restaurants, avoid buffets and salad bars where food sits out for a long time and comes in contact with many people. Food can become contaminated when someone with a virus, often a norovirus, or another "bug" handles it. . Put any leftover food in a "to-go" container yourself, rather than having the server do it. And, refrigerate leftovers as soon as you get home. . Choose restaurants that are clean and that are willing to prepare your food as you order it cooked.     MEDICATIONS: Dexamethasone 92m tablet. Take 2 tablets by mouth once a day on the day after chemotherapy and then take 2 tablets two times a day for 2 days. Take with food.                                                                                                               Zofran/Ondansetron 8101mtablet. Take 1 tablet every 8 hours as needed for nausea/vomiting. (#1 nausea med to take, this can  constipate)  Compazine/Prochlorperazine 1056mablet. Take 1 tablet every 6 hours as needed for nausea/vomiting. (#2 nausea med to take, this can make you sleepy)   EMLA cream. Apply a quarter size amount to port site 1 hour prior to chemo. Do not rub in. Cover with plastic wrap.   Over-the-Counter Meds:  Miralax 1 capful in 8 oz of fluid daily. May increase to two times a day if needed. This is a  stool softener. If this doesn't work proceed you can add:  Senokot S-start with 1 tablet two times a day and increase to 4 tablets two times a day if needed. (total of 8 tablets in a 24 hour period). This is a stimulant laxative.   Call us if this does not help your bowels move.   Imodium 79m capsule. Take 2 capsules after the 1st loose stool and then 1 capsule every 2 hours until you go a total of 12 hours without having a loose stool. Call the [facility] loose stools continue. If diarrhea occurs @ bedtime, take 2 capsules @ bedtime. Then take 2 capsules every 4 hours until morning. Call [facility]     Diarrhea Sheet  If you are having loose stools/diarrhea, please purchase Imodium and begin taking as outlined:  At the first sign of poorly formed or loose stools you should begin taking Imodium(loperamide) 2 mg capsules.  Take two caplets (441m followed by one caplet (15m115mevery 2 hours until you have had no diarrhea for 12 hours.  During the night take two caplets (4mg67mt bedtime and continue every 4 hours during the night until the morning.  Stop taking Imodium only after there is no sign of diarrhea for 12 hours.    Always call the [facility] are having loose stools/diarrhea that you can't get under control.  Loose stools/disrrhea leads to dehydration (loss of water) in your body.  We have other options of trying to get the loose stools/diarrhea to stopped but you must let us kKoreaw!     Constipation Sheet *Miralax in 8 oz of fluid daily.  May increase to two times a day  if needed.  This is a stool softener.  If this not enough to keep your bowel regular:  You can add:  *Senokot S, start with one tablet twice a day and can increase to 4 tablets twice a day if needed.  This is a stimulant laxative.   Sometimes when you take pain medication you need BOTH a medicine to keep your stool soft and a medicine to help your bowel push it out!  Please call if the above does not work for you.   Do not go more than 2 days without a bowel movement.  It is very important that you do not become constipated.  It will make you feel sick to your stomach (nausea) and can cause abdominal pain and vomiting.      Nausea Sheet  Zofran/Ondansetron 8mg 67mlet. Take 1 tablet every 8 hours as needed for nausea/vomiting. (#1 nausea med to take, this can constipate)  Compazine/Prochlorperazine 10mg 90met. Take 1 tablet every 6 hours as needed for nausea/vomiting. (#2 nausea med to take, this can make you sleepy)  You can take these medications together or separately.  We would first like for you to try the Ondansetron by itself and then take the Prochloperizine if needed. But you are allowed to take both medications at the same time if your nausea is that severe.  If you are having persistent nausea (nausea that does not stop) please take these medications on a staggered schedule so that the nausea medication stays in your body.  Please call the [facility] us kn[facility] amount of nausea that you are experiencing.  If you begin to vomit, you need to call the Cancer[facility]af it is the weekend and you have vomited more than one time and cant get it  to stop-go to the Emergency Room.  Persistent nausea/vomiting can lead to dehydration (loss of fluid in your body) and will make you feel terrible.   Ice chips, sips of clear liquids, foods that are @ room temperature, crackers, and toast tend to be better tolerated.     SYMPTOMS TO REPORT AS SOON AS POSSIBLE AFTER  TREATMENT:  FEVER GREATER THAN 100.5 F  CHILLS WITH OR WITHOUT FEVER  NAUSEA AND VOMITING THAT IS NOT CONTROLLED WITH YOUR NAUSEA MEDICATION  UNUSUAL SHORTNESS OF BREATH  UNUSUAL BRUISING OR BLEEDING  TENDERNESS IN MOUTH AND THROAT WITH OR WITHOUT PRESENCE OF ULCERS  URINARY PROBLEMS  BOWEL PROBLEMS  UNUSUAL RASH     Wear comfortable clothing and clothing appropriate for easy access to any Portacath or PICC line. Let us know if there is anything that we can do to make your therapy better!     What to do if you need assistance after hours or on the weekends: CALL [phone].  HOLD on the line, do not hang up.  You will hear multiple messages but at the end you will be connected with a nurse triage line.  They will contact Dr Whitney Muse if necessary.  Most of the time they will be able to assist you.   Do not call the hospital operator.  Dr Whitney Muse will not answer phone calls received by them.       I have been informed and understand all of the instructions given to me and have received a copy. I have been instructed to call the clinic [phone] or my family physician as soon as possible for continued medical care, if indicated. I do not have any more questions at this time but understand that I may call the [facility] or the Patient Navigator at [phone] during office hours should I have questions or need assistance in obtaining follow-up care.

## 2016-10-17 NOTE — H&P (Signed)
  NTS SOAP Note  Vital Signs:  Vitals as of: 99991111: Systolic A999333: Diastolic 73: Heart Rate 61: Temp 97.75F (Temporal): Height 21ft 5in: Weight 150Lbs 0 Ounces: BMI 24.96   BMI : 24.96 kg/m2  Subjective: This 60 year old female with recently diagnosed right breast cancer has been referred back for a Port-A-Cath insertion. She is about to undergo chemotherapy for her breast cancer. In addition, she has developed a hematoma at the right partial mastectomy site.  Review of Symptoms:  Constitutional:negative Head:negative Eyes:negative Nose/Mouth/Throat:negative Cardiovascular:negative Respiratory:negative Gastrointestinnegative Genitourinary:negative Musculoskeletal:negative Skin:negative as above Hematolgic/Lymphatic:negative Allergic/Immunologic:negative   Past Medical History:Reviewed  Past Medical History  Surgical History: carpal tunnel release Medical Problems: HTN, reflux Allergies: lipitor Medications: omeprazole, lisinopril/HCTZ, amlodipine   Social History:Reviewed  Social History  Preferred Language: English Race:  Black or African American Ethnicity: Not Hispanic / Latino Age: 45 year Marital Status:  S Alcohol: no   Smoking Status: Never smoker reviewed on 09/11/2016 Functional Status reviewed on 09/11/2016 ------------------------------------------------ Bathing: Normal Cooking: Normal Dressing: Normal Driving: Normal Eating: Normal Managing Meds: Normal Oral Care: Normal Shopping: Normal Toileting: Normal Transferring: Normal Walking: Normal Cognitive Status reviewed on 09/11/2016 ------------------------------------------------ Attention: Normal Decision Making: Normal Language: Normal Memory: Normal Motor: Normal Perception: Normal Problem Solving: Normal Visual and Spatial: Normal   Family History:Reviewed  Family Health History Mother, Living; Alcoholism;  Father     Objective  Information: General:Well appearing, well nourished in no distress. Skin:no rash or prominent lesions Head:Atraumatic; no masses; no abnormalities Neck:Supple without lymphadenopathy.  Heart:RRR, no murmur or gallop.  Normal S1, S2.  No S3, S4.  Lungs:CTA bilaterally, no wheezes, rhonchi, rales.  Breathing unlabored. Right breast examination reveals a swollen hematoma in the lateral aspect of the right breast. The incision is intact and healing well.   Assessment:Right breast cancer  Diagnoses: 174.4  C50.411 Primary malignant neoplasm of upper outer quadrant of female breast (Malignant neoplasm of upper-outer quadrant of right female breast)   Plan:  Patient is scheduled for Port-A-Cath insertion, evacuation of hematoma on 10/24/2016. The risks and benefits of the procedure including bleeding, infection, and pneumothorax were fully explained to the patient, who gave informed consent. Patient Education:Alternative treatments to surgery were discussed with patient (and family). Follow-up:Weeks 1

## 2016-10-22 ENCOUNTER — Encounter (HOSPITAL_BASED_OUTPATIENT_CLINIC_OR_DEPARTMENT_OTHER): Payer: BLUE CROSS/BLUE SHIELD | Admitting: Oncology

## 2016-10-22 ENCOUNTER — Encounter (HOSPITAL_COMMUNITY): Payer: Self-pay | Admitting: Oncology

## 2016-10-22 ENCOUNTER — Encounter (HOSPITAL_COMMUNITY): Payer: Self-pay

## 2016-10-22 ENCOUNTER — Encounter (HOSPITAL_COMMUNITY)
Admission: RE | Admit: 2016-10-22 | Discharge: 2016-10-22 | Disposition: A | Discharge status: Home / Self Care | Payer: BLUE CROSS/BLUE SHIELD | Source: Ambulatory Visit | Attending: General Surgery | Admitting: General Surgery

## 2016-10-22 DIAGNOSIS — C50411 Malignant neoplasm of upper-outer quadrant of right female breast: Secondary | ICD-10-CM

## 2016-10-22 DIAGNOSIS — C50919 Malignant neoplasm of unspecified site of unspecified female breast: Secondary | ICD-10-CM

## 2016-10-22 DIAGNOSIS — Z171 Estrogen receptor negative status [ER-]: Secondary | ICD-10-CM

## 2016-10-22 DIAGNOSIS — C50911 Malignant neoplasm of unspecified site of right female breast: Secondary | ICD-10-CM

## 2016-10-22 HISTORY — DX: Malignant neoplasm of unspecified site of unspecified female breast: C50.919

## 2016-10-22 NOTE — Assessment & Plan Note (Addendum)
Stage IA (T1cN0M0) invasive ductal carcinoma of right breast, upper outer quadrant, HER2 NEGATIVE, ER/PR NEGATIVE.  Oncology history developed.  Staging in [facility] problem list completed.  No role for labs today.  She is scheduled for chemotherapy teaching on 10/24/2016 for [facility] + T.  She is scheduled for 2D ECHO on 10/24/2016 to evaluate LVEF.  She is planned for a port placement by Dr. Arnoldo Morale on 10/26/2016.  We will anticipate starting chemotherapy on 11/02/2016.  More than 50% of the time spent with the patient was utilized for counseling and coordination of care.  25+ minutes is spent in face-to-face discussion with the patient.

## 2016-10-22 NOTE — Patient Instructions (Addendum)
[place] at [facility] Discharge Instructions  RECOMMENDATIONS MADE BY THE CONSULTANT AND ANY TEST RESULTS WILL BE SENT TO YOUR REFERRING PHYSICIAN.  You were seen today by [name] PA-C. Echo as scheduled. Start Chemo on Friday.   Thank you for choosing [place] at [facility] to provide your oncology and hematology care.  To afford each patient quality time with our provider, please arrive at least 15 minutes before your scheduled appointment time.   Beginning January 23rd 2017 lab work for the [facility] will be done in the  Main lab at [facility] on 1st floor. If you have a lab appointment with the [facility] please come in thru the  Main Entrance and check in at the main information desk  You need to re-schedule your appointment should you arrive 10 or more minutes late.  We strive to give you quality time with our providers, and arriving late affects you and other patients whose appointments are after yours.  Also, if you no show three or more times for appointments you may be dismissed from the clinic at the providers discretion.     Again, thank you for choosing [facility].  Our hope is that these requests will decrease the amount of time that you wait before being seen by our physicians.       _____________________________________________________________  Should you have questions after your visit to [facility], please contact our office at [phone] between the hours of 8:30 a.m. and 4:30 p.m.  Voicemails left after 4:30 p.m. will not be returned until the following business day.  For prescription refill requests, have your pharmacy contact our office.         Resources For Cancer Patients and their Caregivers ? American Cancer Society: Can assist with transportation, wigs, general needs, runs Look Good Feel Better.        ([phone] ? Cancer Care: Provides financial assistance,  online support groups, medication/co-pay assistance.  1-800-813-HOPE 9365215916) ? [place] Assists [place] cancer patients and their families through emotional , educational and financial support.  254-256-1798 ? [place] DSS Where to apply for food stamps, Medicaid and utility assistance. 718-717-9655 ? [facility]: Transportation to medical appointments. (380)146-7404 ? Social Security Administration: May apply for disability if have a Stage IV cancer. 743-652-8556 972 245 1103 ? [employer], Disability and Transit Services: Assists with nutrition, care and transit needs. [place] Support Programs: @10RELATIVEDAYS @ > Cancer Support Group  2nd Tuesday of the month 1pm-2pm, Journey Room  > Creative Journey  3rd Tuesday of the month 1130am-1pm, Journey Room  > Look Good Feel Better  1st Wednesday of the month 10am-12 noon, Journey Room (Call [place] to register [phone])

## 2016-10-22 NOTE — Progress Notes (Signed)
Kimberly Fire, MD [place] [address]  Infiltrating ductal carcinoma of right breast [facility])  CURRENT THERAPY: Planning for [facility] + T chemotherapy  INTERVAL HISTORY: Kimberly Roman 60 y.o. female returns for followup of Stage IA (T1cN0M0) invasive ductal carcinoma of right breast, upper outer quadrant, HER2 NEGATIVE, ER/PR NEGATIVE.    Invasive ductal carcinoma of breast ([place])   08/13/2016 Mammogram    BI-RADS CATEGORY  0: Incomplete.      08/21/2016 Mammogram    BI-RADS CATEGORY  5: Highly suggestive of malignancy. Suspicious 1.2 cm mass 10 o'clock position right breast.      08/21/2016 Breast US    Targeted ultrasound is performed, showing a hypoechoic irregular mass with indistinct margins at 10 o'clock position 9 cm from the nipple measuring approximately 1.1 x 1.2 x 1.0 cm. No additional masses are seen in this region of the right breast on ultrasound.      08/28/2016 Procedure    Right needle core biopsy      09/26/2016 Procedure    Right upper outer quadrant lumpectomy by Dr. Arnoldo Morale      09/28/2016 Pathology Results    Invasive ductal carcinoma, grade 3, standing 1.7 cm. High-grade ductal carcinoma in situ with comedonecrosis. Invasive carcinoma in less than 0.1 cm of the superior medial margin focally. In situ carcinoma is less than 0.1 cm of superior medial margin and the posterior margin focally. No LVI. ER negative. PR negative. HER-2 negative. KI-67 90%.      09/28/2016 Cancer Staging    Invasive ductal carcinoma of breast [facility])   Staging form: Breast, AJCC 7th Edition   - Clinical stage from 09/28/2016: Stage IA (T1c, N0, M0) - Signed by Baird Cancer, PA-C on 10/22/2016      09/29/2016 Pathology Results    Invasive ductal carcinoma, grade 2/3. HER-2 negative. ER negative. PR negative. Ki-67 90%.       She has no active complaints today. She is here to learn about chemotherapy and what to expect. We've briefly reviewed  the risks, benefits, alternatives, and side effects of systemic chemotherapy including the more common side effects such as fatigue/tiredness, nausea, vomiting, alopecia, diarrhea, constipation, decreased blood counts, increased risk of infection, etc.  All of her specific questions are answered today.  Review of Systems  Constitutional: Negative.  Negative for chills, fever and weight loss.  HENT: Negative.   Eyes: Negative.  Negative for double vision.  Respiratory: Negative.  Negative for cough.   Cardiovascular: Negative.  Negative for chest pain.  Gastrointestinal: Negative for constipation, diarrhea, nausea and vomiting.  Genitourinary: Negative.  Negative for dysuria.  Musculoskeletal: Negative.   Skin: Negative.  Negative for itching and rash.  Neurological: Negative.  Negative for weakness.  Endo/Heme/Allergies: Negative.   Psychiatric/Behavioral: The patient is nervous/anxious.     Past Medical History:  Diagnosis Date  . Ankylosing spondylitis ([place])   . Arthritis   . Breast cancer ([place])   . Carpal tunnel syndrome   . Environmental allergies   . GERD (gastroesophageal reflux disease)   . Hyperlipemia   . Hypertension   . Invasive ductal carcinoma of breast ([place]) 10/22/2016    Past Surgical History:  Procedure Laterality Date  . ABDOMINAL HYSTERECTOMY    . CARPAL TUNNEL RELEASE Right 04/27/2013   Procedure: RIGHT CARPAL TUNNEL RELEASE;  Surgeon: Carole Civil, MD;  Location: [facility] ORS;  Service: Orthopedics;  Laterality: Right;  . COLONOSCOPY N/A 01/20/2013  Procedure: COLONOSCOPY;  Surgeon: Danie Binder, MD;  Location: [facility] ENDO SUITE;  Service: Endoscopy;  Laterality: N/A;  9:30 AM  . PARTIAL MASTECTOMY WITH AXILLARY SENTINEL LYMPH NODE BIOPSY Right 09/26/2016   Procedure: PARTIAL MASTECTOMY WITH AXILLARY SENTINEL LYMPH NODE BIOPSY;  Surgeon: Aviva Signs, MD;  Location: [facility] ORS;  Service: General;  Laterality: Right;    Family History  Problem Relation Age of  Onset  . Arthritis    . Hypertension Mother   . Hypertension Father   . Colon cancer Neg Hx     Social History   Social History  . Marital status: Single    Spouse name: N/A  . Number of children: N/A  . Years of education: N/A   Social History Main Topics  . Smoking status: Never Smoker  . Smokeless tobacco: Never Used  . Alcohol use No  . Drug use: No  . Sexual activity: Not on file   Other Topics Concern  . Not on file   Social History Narrative  . No narrative on file     PHYSICAL EXAMINATION  ECOG PERFORMANCE STATUS: 0 - Asymptomatic  Vitals:   10/22/16 1505  BP: 126/76  Pulse: 90  Resp: 20  Temp: 98.2 F (36.8 C)    GENERAL:alert, no distress, well nourished, well developed, anxious, comfortable, cooperative, smiling and accompanied by her mother, Odella. SKIN: skin color, texture, turgor are normal, no rashes or significant lesions HEAD: Normocephalic, No masses, lesions, tenderness or abnormalities EYES: normal EARS: External ears normal OROPHARYNX:lips, buccal mucosa, and tongue normal and mucous membranes are moist  NECK: trachea midline LYMPH:  not examined BREAST:not examined LUNGS: clear to auscultation  HEART: regular rate & rhythm ABDOMEN:abdomen soft BACK: Back symmetric, no curvature. EXTREMITIES:less then 2 second capillary refill, no joint deformities, effusion, or inflammation, no skin discoloration, no cyanosis  NEURO: alert & oriented x 3 with fluent speech, no focal motor/sensory deficits, gait normal   LABORATORY DATA: CBC    Component Value Date/Time   WBC 8.9 09/24/2016 1528   RBC 4.29 09/24/2016 1528   HGB 12.9 09/26/2016 0802   HCT 38.0 09/26/2016 0802   PLT 302 09/24/2016 1528   MCV 91.1 09/24/2016 1528   MCH 29.6 09/24/2016 1528   MCHC 32.5 09/24/2016 1528   RDW 13.3 09/24/2016 1528   LYMPHSABS 1.6 09/24/2016 1528   MONOABS 0.5 09/24/2016 1528   EOSABS 0.0 09/24/2016 1528   BASOSABS 0.0 09/24/2016 1528       Chemistry      Component Value Date/Time   NA 144 09/26/2016 0802   K 4.0 09/26/2016 0802   CL 102 09/24/2016 1528   CO2 29 09/24/2016 1528   BUN 10 09/24/2016 1528   CREATININE 0.73 09/24/2016 1528      Component Value Date/Time   CALCIUM 9.5 09/24/2016 1528   ALKPHOS 79 09/24/2016 1528   AST 17 09/24/2016 1528   ALT 15 09/24/2016 1528   BILITOT 0.6 09/24/2016 1528        PENDING LABS:   RADIOGRAPHIC STUDIES:  Chest 2 View  Result Date: 09/25/2016 CLINICAL DATA:  History of breast malignancy, hypertension, and hyperlipidemia. EXAM: CHEST  2 VIEW COMPARISON:  No recent studies in PACs FINDINGS: The lungs are mildly hyperinflated and clear. The heart and pulmonary vascularity are normal. The mediastinum is normal in width. There is no pleural effusion. No pulmonary parenchymal nodules or masses are observed. No mediastinal or hilar lymphadenopathy is demonstrated. The bony thorax exhibits no  acute abnormality. IMPRESSION: Mild hyperinflation consistent with COPD or reactive airway disease. No acute cardiopulmonary abnormality. Electronically Signed   By: David  Martinique M.D.   On: 09/25/2016 07:07   Nm Sentinel Node Inj-no Rpt (breast)  Result Date: 09/26/2016 CLINICAL DATA: Right Breast cancer Sulfur colloid was injected intradermally by the nuclear medicine technologist for breast cancer sentinel node localization.   Mm Rt Breast Bx W Loc Dev 1st Lesion Image Bx Spec Stereo Guide  Result Date: 09/26/2016 CLINICAL DATA:  Post excisional biopsy RIGHT breast EXAM: SPECIMEN RADIOGRAPH OF THE RIGHT BREAST COMPARISON:  Previous exam(s). FINDINGS: Status post excision of the RIGHT breast. The specimen radiograph contains a ribbon shaped biopsy clip near the margin of the specimen. A needle was placed into the specimen at the site of the clip. IMPRESSION: Specimen radiograph of the RIGHT breast. Electronically Signed   By: Lavonia Dana M.D.   On: 09/26/2016 16:23     PATHOLOGY:      ASSESSMENT AND PLAN:  Invasive ductal carcinoma of breast (HCC) Stage IA (T1cN0M0) invasive ductal carcinoma of right breast, upper outer quadrant, HER2 NEGATIVE, ER/PR NEGATIVE.  Oncology history developed.  Staging in [facility] problem list completed.  No role for labs today.  She is scheduled for chemotherapy teaching on 10/24/2016 for [facility] + T.  She is scheduled for 2D ECHO on 10/24/2016 to evaluate LVEF.  She is planned for a port placement by Dr. Arnoldo Morale on 10/26/2016.  We will anticipate starting chemotherapy on 11/02/2016.   ORDERS PLACED FOR THIS ENCOUNTER: No orders of the defined types were placed in this encounter.   MEDICATIONS PRESCRIBED THIS ENCOUNTER: No orders of the defined types were placed in this encounter.   THERAPY PLAN:  AC + T  All questions were answered. The patient knows to call the clinic with any problems, questions or concerns. We can certainly see the patient much sooner if necessary.  Patient and plan discussed with Dr. Ancil Linsey and she is in agreement with the aforementioned.   This note is electronically signed by: Doy Mince 10/22/2016 5:28 PM

## 2016-10-22 NOTE — Progress Notes (Signed)
START ON PATHWAY REGIMEN - Breast  [id number]: AC-T - [Doxorubicin + Cyclophosphamide q21 Days x 4 Cycles, Followed by Paclitaxel Weekly x 12 Weeks]  Doxorubicin + Cyclophosphamide (AC):   A cycle is every 21 days:     Doxorubicin (Adriamycin(R)) 60 mg/m2 IV Push followed by Dose Mod: None     Cyclophosphamide (Cytoxan(R)) 600 mg/m2 in 250 mL NS IV over 30 minutes Dose Mod: None  **Always confirm dose/schedule in your pharmacy ordering system**    Paclitaxel 80 mg/m2 Weekly:   Administer weekly:     Paclitaxel (Taxol(R)) 80 mg/m2 in 250 mL NS IV over 1 hour Dose Mod: None  **Always confirm dose/schedule in your pharmacy ordering system**    Patient Characteristics: Adjuvant Therapy, Node Negative, HER2/neu Negative/Unknown/Equivocal, ER Negative AJCC Stage Grouping: IA Current Disease Status: No Distant Mets or Local Recurrence AJCC M Stage: 0 ER Status: Negative (-) AJCC N Stage: 0 AJCC T Stage: 1c HER2/neu: Negative (-) PR Status: Negative (-) Node Status: Negative (-)  Intent of Therapy: Curative Intent, Discussed with Patient

## 2016-10-23 ENCOUNTER — Encounter: Payer: Self-pay | Admitting: *Deleted

## 2016-10-23 ENCOUNTER — Other Ambulatory Visit (HOSPITAL_COMMUNITY): Payer: Self-pay | Admitting: Oncology

## 2016-10-23 ENCOUNTER — Encounter (HOSPITAL_COMMUNITY): Payer: Self-pay | Admitting: Emergency Medicine

## 2016-10-23 MED ORDER — PROCHLORPERAZINE MALEATE 10 MG PO TABS: 10.0000 mg | ORAL_TABLET | Freq: Four times a day (QID) | ORAL | 1 refills | Status: DC | PRN

## 2016-10-23 MED ORDER — DEXAMETHASONE 4 MG PO TABS: ORAL_TABLET | ORAL | 1 refills | Status: DC

## 2016-10-23 MED ORDER — LIDOCAINE-PRILOCAINE 2.5-2.5 % EX CREA: TOPICAL_CREAM | CUTANEOUS | 3 refills | Status: DC

## 2016-10-23 MED ORDER — ONDANSETRON HCL 8 MG PO TABS: 8.0000 mg | ORAL_TABLET | Freq: Two times a day (BID) | ORAL | 1 refills | Status: DC | PRN

## 2016-10-23 NOTE — Progress Notes (Signed)
Chemotherapy teaching pulled together.  Labs entered.  Medications escribed.  Chemotherapy/doctors appt made.

## 2016-10-23 NOTE — Progress Notes (Signed)
[place] Psychosocial Distress Screening Clinical Social Work  Clinical Social Work was referred by distress screening protocol.  The patient scored a 7 on the Psychosocial Distress Thermometer which indicates severe distress. Clinical Social Worker reviewed chart and phoned pt at home to assess for distress and other psychosocial needs. CSW left brief, supportive message explaining role of CSW/Support Programs at [facility]. CSW encouraged pt to return CSW phone call to review additional resources that may be of assistance to patient during treatment.   ONCBCN DISTRESS SCREENING 10/11/2016  Screening Type Initial Screening  Distress experienced in past week (1-10) 7  Practical problem type (No Data)  Emotional problem type Adjusting to illness  Referral to clinical social work Yes    Clinical Social Worker follow up needed: Yes.    If yes, follow up plan: CSW awaits return call.   Loren Racer, [place] Tuesdays   [phone]

## 2016-10-24 ENCOUNTER — Encounter (HOSPITAL_COMMUNITY): Payer: BLUE CROSS/BLUE SHIELD

## 2016-10-24 ENCOUNTER — Ambulatory Visit (HOSPITAL_COMMUNITY)
Admission: RE | Admit: 2016-10-24 | Discharge: 2016-10-24 | Disposition: A | Discharge status: Home / Self Care | Payer: BLUE CROSS/BLUE SHIELD | Source: Ambulatory Visit | Attending: Hematology & Oncology | Admitting: Hematology & Oncology

## 2016-10-24 DIAGNOSIS — C50911 Malignant neoplasm of unspecified site of right female breast: Secondary | ICD-10-CM

## 2016-10-24 DIAGNOSIS — I361 Nonrheumatic tricuspid (valve) insufficiency: Secondary | ICD-10-CM | POA: Diagnosis not present

## 2016-10-24 DIAGNOSIS — I501 Left ventricular failure: Secondary | ICD-10-CM | POA: Diagnosis not present

## 2016-10-24 DIAGNOSIS — C50011 Malignant neoplasm of nipple and areola, right female breast: Secondary | ICD-10-CM | POA: Diagnosis not present

## 2016-10-24 LAB — ECHOCARDIOGRAM COMPLETE
AO mean calculated velocity dopler: 117 cm/s
AV Area VTI index: 1.11 cm2/m2
AV Area VTI: 2.01 cm2
AV Area mean vel: 1.75 cm2
AV Mean grad: 7 mmHg
AV Peak grad: 15 mmHg
AV VEL mean LVOT/AV: 0.69
AV area mean vel ind: 0.99 cm2/m2
AV peak Index: 1.14
AV pk vel: 195 cm/s
AV vel: 1.97
Ao pk vel: 0.79 m/s
E decel time: 292 msec
E/e' ratio: 9.17
FS: 40 % (ref 28–44)
IVS/LV PW RATIO, ED: 1.04
LA ID, A-P, ES: 32 mm
LA diam end sys: 32 mm
LA diam index: 1.81 cm/m2
LA vol A4C: 42.9 ml
LA vol index: 24.4 mL/m2
LA vol: 43.2 mL
LV E/e' medial: 9.17
LV E/e'average: 9.17
LV PW d: 9.2 mm — AB (ref 0.6–1.1)
LV dias vol index: 31 mL/m2
LV dias vol: 54 mL (ref 46–106)
LV e' LATERAL: 8.05 cm/s
LV sys vol index: 12 mL/m2
LV sys vol: 22 mL (ref 14–42)
LVOT SV: 80 mL
LVOT VTI: 31.3 cm
LVOT area: 2.54 cm2
LVOT diameter: 18 mm
LVOT peak VTI: 0.77 cm
LVOT peak grad rest: 9 mmHg
LVOT peak vel: 154 cm/s
Lateral S' vel: 13.7 cm/s
MV Dec: 292
MV Peak grad: 2 mmHg
MV pk A vel: 77.7 m/s
MV pk E vel: 73.8 m/s
RV sys press: 22 mmHg
Reg peak vel: 220 cm/s
Simpson's disk: 60
Stroke v: 33 ml
TAPSE: 22.3 mm
TDI e' lateral: 8.05
TDI e' medial: 6.09
TR max vel: 220 cm/s
VTI: 40.4 cm
Valve area index: 1.11
Valve area: 1.97 cm2

## 2016-10-24 NOTE — Progress Notes (Signed)
Adriamycin, cyclophosamide, and taxol consent signed

## 2016-10-24 NOTE — Progress Notes (Signed)
[place] Clinical Social Work  Clinical Social Work was received return call from patient for assessment of psychosocial needs due to caregiving concerns conflicting with treatment. 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 Clinical Social Worker educated pt on the option of asking CAP for additional help on her treatment days. Pt was not interested in this option currently. CSW reviewed additional resources such as [employer] [place], medicaid and Duanne [place] for financial assistance and support. Pt will consider contacting for help.  CSW explained role of CSW and availability. Pt appreciated support, but appeared unsure about requesting assistance from local and national agencies. CSW willing to assist as pt agrees.      Clinical Social Work interventions: Resource education and referral  Grier Leanora Murin, [place] Tuesdays   [phone]

## 2016-10-24 NOTE — Progress Notes (Signed)
*  PRELIMINARY RESULTS* Echocardiogram 2D Echocardiogram has been performed.  Kimberly Roman 10/24/2016, 2:54 PM

## 2016-10-26 ENCOUNTER — Ambulatory Visit (HOSPITAL_COMMUNITY)
Admission: RE | Admit: 2016-10-26 | Discharge: 2016-10-26 | Disposition: A | Discharge status: Home / Self Care | Payer: BLUE CROSS/BLUE SHIELD | Source: Ambulatory Visit | Attending: General Surgery | Admitting: General Surgery

## 2016-10-26 ENCOUNTER — Encounter (HOSPITAL_COMMUNITY)
Admission: RE | Disposition: A | Discharge status: Home / Self Care | Payer: Self-pay | Source: Ambulatory Visit | Attending: General Surgery

## 2016-10-26 ENCOUNTER — Encounter (HOSPITAL_COMMUNITY): Payer: Self-pay

## 2016-10-26 ENCOUNTER — Ambulatory Visit (HOSPITAL_COMMUNITY): Payer: BLUE CROSS/BLUE SHIELD | Admitting: Anesthesiology

## 2016-10-26 ENCOUNTER — Ambulatory Visit (HOSPITAL_COMMUNITY): Payer: BLUE CROSS/BLUE SHIELD

## 2016-10-26 DIAGNOSIS — K219 Gastro-esophageal reflux disease without esophagitis: Secondary | ICD-10-CM | POA: Insufficient documentation

## 2016-10-26 DIAGNOSIS — N6489 Other specified disorders of breast: Secondary | ICD-10-CM | POA: Diagnosis not present

## 2016-10-26 DIAGNOSIS — Z95828 Presence of other vascular implants and grafts: Secondary | ICD-10-CM

## 2016-10-26 DIAGNOSIS — C50411 Malignant neoplasm of upper-outer quadrant of right female breast: Secondary | ICD-10-CM | POA: Insufficient documentation

## 2016-10-26 DIAGNOSIS — I10 Essential (primary) hypertension: Secondary | ICD-10-CM | POA: Diagnosis not present

## 2016-10-26 DIAGNOSIS — Z888 Allergy status to other drugs, medicaments and biological substances status: Secondary | ICD-10-CM | POA: Diagnosis not present

## 2016-10-26 HISTORY — PX: HEMATOMA EVACUATION: SHX5118

## 2016-10-26 HISTORY — PX: PORTACATH PLACEMENT: SHX2246

## 2016-10-26 SURGERY — INSERTION, TUNNELED CENTRAL VENOUS DEVICE, WITH PORT
Anesthesia: General | Site: Chest | Laterality: Right

## 2016-10-26 MED ORDER — EPHEDRINE SULFATE 50 MG/ML IJ SOLN
INTRAMUSCULAR | Status: AC
  Filled 2016-10-26: qty 1

## 2016-10-26 MED ORDER — CEFAZOLIN SODIUM-DEXTROSE 2-4 GM/100ML-% IV SOLN
2.0000 g | INTRAVENOUS | Status: AC
  Administered 2016-10-26: 2 g via INTRAVENOUS
  Filled 2016-10-26: qty 100

## 2016-10-26 MED ORDER — MIDAZOLAM HCL 2 MG/2ML IJ SOLN
1.0000 mg | INTRAMUSCULAR | Status: DC | PRN
Start: 2016-10-26 — End: 2016-10-26
  Administered 2016-10-26: 2 mg via INTRAVENOUS
  Filled 2016-10-26: qty 2

## 2016-10-26 MED ORDER — PROPOFOL 10 MG/ML IV BOLUS
INTRAVENOUS | Status: AC
  Filled 2016-10-26: qty 20

## 2016-10-26 MED ORDER — SODIUM CHLORIDE 0.9 % IV SOLN
INTRAVENOUS | Status: DC | PRN
  Administered 2016-10-26: 500 mL

## 2016-10-26 MED ORDER — GLYCOPYRROLATE 0.2 MG/ML IJ SOLN
INTRAMUSCULAR | Status: DC | PRN
  Administered 2016-10-26: 0.6 mg via INTRAVENOUS

## 2016-10-26 MED ORDER — LACTATED RINGERS IV SOLN
INTRAVENOUS | Status: DC
  Administered 2016-10-26: 09:00:00 via INTRAVENOUS

## 2016-10-26 MED ORDER — FENTANYL CITRATE (PF) 100 MCG/2ML IJ SOLN: 25.0000 ug | INTRAMUSCULAR | Status: DC | PRN

## 2016-10-26 MED ORDER — HEPARIN SOD (PORK) LOCK FLUSH 100 UNIT/ML IV SOLN
INTRAVENOUS | Status: DC | PRN
Start: 2016-10-26 — End: 2016-10-26
  Administered 2016-10-26: 500 [IU] via INTRAVENOUS

## 2016-10-26 MED ORDER — CHLORHEXIDINE GLUCONATE CLOTH 2 % EX PADS
6.0000 | MEDICATED_PAD | Freq: Once | CUTANEOUS | Status: AC
  Administered 2016-10-26: 6 via TOPICAL

## 2016-10-26 MED ORDER — PROPOFOL 10 MG/ML IV BOLUS
INTRAVENOUS | Status: DC | PRN
  Administered 2016-10-26: 140 mg via INTRAVENOUS

## 2016-10-26 MED ORDER — MIDAZOLAM HCL 2 MG/2ML IJ SOLN
INTRAMUSCULAR | Status: AC
  Filled 2016-10-26: qty 2

## 2016-10-26 MED ORDER — NEOSTIGMINE METHYLSULFATE 10 MG/10ML IV SOLN
INTRAVENOUS | Status: DC | PRN
  Administered 2016-10-26: 1 mg via INTRAVENOUS
  Administered 2016-10-26: 3 mg via INTRAVENOUS

## 2016-10-26 MED ORDER — MIDAZOLAM HCL 5 MG/5ML IJ SOLN
INTRAMUSCULAR | Status: DC | PRN
  Administered 2016-10-26: 2 mg via INTRAVENOUS

## 2016-10-26 MED ORDER — ONDANSETRON HCL 4 MG/2ML IJ SOLN
4.0000 mg | Freq: Once | INTRAMUSCULAR | Status: AC
  Administered 2016-10-26: 4 mg via INTRAVENOUS
  Filled 2016-10-26: qty 2

## 2016-10-26 MED ORDER — BUPIVACAINE HCL (PF) 0.5 % IJ SOLN
INTRAMUSCULAR | Status: AC
  Filled 2016-10-26: qty 30

## 2016-10-26 MED ORDER — FENTANYL CITRATE (PF) 100 MCG/2ML IJ SOLN
INTRAMUSCULAR | Status: DC | PRN
  Administered 2016-10-26: 50 ug via INTRAVENOUS

## 2016-10-26 MED ORDER — HEPARIN SOD (PORK) LOCK FLUSH 100 UNIT/ML IV SOLN
INTRAVENOUS | Status: AC
  Filled 2016-10-26: qty 5

## 2016-10-26 MED ORDER — POVIDONE-IODINE 10 % OINT PACKET
TOPICAL_OINTMENT | CUTANEOUS | Status: DC | PRN
  Administered 2016-10-26: 1 via TOPICAL

## 2016-10-26 MED ORDER — KETOROLAC TROMETHAMINE 30 MG/ML IJ SOLN
30.0000 mg | Freq: Once | INTRAMUSCULAR | Status: AC
  Administered 2016-10-26: 30 mg via INTRAVENOUS
  Filled 2016-10-26: qty 1

## 2016-10-26 MED ORDER — SODIUM CHLORIDE 0.9 % IJ SOLN
INTRAMUSCULAR | Status: AC
  Filled 2016-10-26: qty 10

## 2016-10-26 MED ORDER — CHLORHEXIDINE GLUCONATE CLOTH 2 % EX PADS: 6.0000 | MEDICATED_PAD | Freq: Once | CUTANEOUS | Status: DC

## 2016-10-26 MED ORDER — BUPIVACAINE HCL (PF) 0.5 % IJ SOLN
INTRAMUSCULAR | Status: DC | PRN
  Administered 2016-10-26: 8 mL

## 2016-10-26 MED ORDER — ROCURONIUM BROMIDE 100 MG/10ML IV SOLN
INTRAVENOUS | Status: DC | PRN
  Administered 2016-10-26: 20 mg via INTRAVENOUS

## 2016-10-26 MED ORDER — POVIDONE-IODINE 10 % EX OINT
TOPICAL_OINTMENT | CUTANEOUS | Status: AC
Start: 2016-10-26 — End: 2016-10-26
  Filled 2016-10-26: qty 1

## 2016-10-26 MED ORDER — SUCCINYLCHOLINE CHLORIDE 20 MG/ML IJ SOLN
INTRAMUSCULAR | Status: DC | PRN
  Administered 2016-10-26: 140 mg via INTRAVENOUS
  Administered 2016-10-26: 1 mg via INTRAVENOUS

## 2016-10-26 MED ORDER — FENTANYL CITRATE (PF) 100 MCG/2ML IJ SOLN
INTRAMUSCULAR | Status: AC
  Filled 2016-10-26: qty 2

## 2016-10-26 SURGICAL SUPPLY — 43 items
ADH SKN CLS APL DERMABOND .7 (GAUZE/BANDAGES/DRESSINGS) ×2
APPLIER CLIP 9.375 SM OPEN (CLIP)
APR CLP SM 9.3 20 MLT OPN (CLIP)
BAG DECANTER FOR FLEXI CONT (MISCELLANEOUS) ×3 IMPLANT
BAG HAMPER (MISCELLANEOUS) ×3 IMPLANT
CATH HICKMAN DUAL 12.0 (CATHETERS) IMPLANT
CHLORAPREP W/TINT 10.5 ML (MISCELLANEOUS) ×3 IMPLANT
CHLORAPREP W/TINT 26ML (MISCELLANEOUS) ×1 IMPLANT
CLIP APPLIE 9.375 SM OPEN (CLIP) IMPLANT
CLOTH BEACON ORANGE TIMEOUT ST (SAFETY) ×3 IMPLANT
COVER LIGHT HANDLE STERIS (MISCELLANEOUS) ×6 IMPLANT
DECANTER SPIKE VIAL GLASS SM (MISCELLANEOUS) ×3 IMPLANT
DERMABOND ADVANCED (GAUZE/BANDAGES/DRESSINGS) ×1
DERMABOND ADVANCED .7 DNX12 (GAUZE/BANDAGES/DRESSINGS) ×2 IMPLANT
DRAPE C-ARM FOLDED MOBILE STRL (DRAPES) ×3 IMPLANT
ELECT REM PT RETURN 9FT ADLT (ELECTROSURGICAL) ×3
ELECTRODE REM PT RTRN 9FT ADLT (ELECTROSURGICAL) ×2 IMPLANT
GLOVE BIOGEL PI IND STRL 7.0 (GLOVE) ×2 IMPLANT
GLOVE BIOGEL PI INDICATOR 7.0 (GLOVE) ×1
GLOVE SURG SS PI 7.5 STRL IVOR (GLOVE) ×3 IMPLANT
GOWN STRL REUS W/TWL LRG LVL3 (GOWN DISPOSABLE) ×6 IMPLANT
HEMOSTAT ARISTA ABSORB 3G PWDR (MISCELLANEOUS) ×1 IMPLANT
IV NS 500ML (IV SOLUTION) ×3
IV NS 500ML BAXH (IV SOLUTION) ×2 IMPLANT
KIT PORT POWER 8FR ISP MRI (Port) ×3 IMPLANT
KIT ROOM TURNOVER APOR (KITS) ×3 IMPLANT
MANIFOLD NEPTUNE II (INSTRUMENTS) ×3 IMPLANT
NDL HYPO 25X1 1.5 SAFETY (NEEDLE) ×2 IMPLANT
NEEDLE HYPO 25X1 1.5 SAFETY (NEEDLE) ×3 IMPLANT
PACK MINOR (CUSTOM PROCEDURE TRAY) ×3 IMPLANT
PAD ARMBOARD 7.5X6 YLW CONV (MISCELLANEOUS) ×3 IMPLANT
SET BASIN LINEN APH (SET/KITS/TRAYS/PACK) ×3 IMPLANT
SET INTRODUCER 12FR PACEMAKER (SHEATH) IMPLANT
SHEATH COOK PEEL AWAY SET 8F (SHEATH) IMPLANT
SPONGE GAUZE 2X2 8PLY STRL LF (GAUZE/BANDAGES/DRESSINGS) ×2 IMPLANT
SUT PROLENE 4 0 PS 2 18 (SUTURE) ×12 IMPLANT
SUT VIC AB 3-0 SH 27 (SUTURE) ×6
SUT VIC AB 3-0 SH 27X BRD (SUTURE) ×2 IMPLANT
SUT VIC AB 4-0 PS2 27 (SUTURE) ×4 IMPLANT
SYR 20CC LL (SYRINGE) ×3 IMPLANT
SYR BULB IRRIGATION 50ML (SYRINGE) ×1 IMPLANT
SYR CONTROL 10ML LL (SYRINGE) ×3 IMPLANT
TAPE CLOTH SURG 4X10 WHT LF (GAUZE/BANDAGES/DRESSINGS) ×1 IMPLANT

## 2016-10-26 NOTE — Transfer of Care (Signed)
Immediate Anesthesia Transfer of Care Note  Patient: KADEEJA RISTINE  Procedure(s) Performed: Procedure(s): INSERTION PORT-A-CATH (Left) EVACUATION HEMATOMA (Right)  Patient Location: Short Stay  Anesthesia Type:General  Level of Consciousness: awake and patient cooperative  Airway & Oxygen Therapy: Patient Spontanous Breathing and Patient connected to face mask oxygen  Post-op Assessment: Report given to RN, Post -op Vital signs reviewed and stable and Patient moving all extremities  Post vital signs: Reviewed and stable  Last Vitals:  Vitals:   10/26/16 1000 10/26/16 1005  BP:    Pulse:    Resp: 20 15  Temp:      Last Pain:  Vitals:   10/26/16 0910  TempSrc: Oral      Patients Stated Pain Goal: 7 ([id number])  Complications: No apparent anesthesia complications

## 2016-10-26 NOTE — Anesthesia Postprocedure Evaluation (Signed)
Anesthesia Post Note  Patient: Kimberly Roman  Procedure(s) Performed: Procedure(s) (LRB): INSERTION PORT-A-CATH (Left) EVACUATION HEMATOMA (Right)  Patient location during evaluation: PACU Anesthesia Type: General Level of consciousness: awake and patient cooperative Pain management: pain level controlled Vital Signs Assessment: post-procedure vital signs reviewed and stable Respiratory status: spontaneous breathing, nonlabored ventilation and respiratory function stable Cardiovascular status: blood pressure returned to baseline Postop Assessment: no signs of nausea or vomiting Anesthetic complications: no    Last Vitals:  Vitals:   10/26/16 1000 10/26/16 1005  BP:    Pulse:    Resp: 20 15  Temp:      Last Pain:  Vitals:   10/26/16 0910  TempSrc: Oral                 [name] J

## 2016-10-26 NOTE — Op Note (Signed)
Patient:  Kimberly Roman  DOB:  December 18, 1955  MRN:  [id number]   Preop Diagnosis:  Right breast carcinoma, need for central venous access, hematoma of right breast  Postop Diagnosis:  Same  Procedure:  Port-A-Cath insertion, evacuation of hematoma and right breast  Surgeon:  Aviva Signs, M.D.  Anes:  Gen. endotracheal  Indications:  Patient is a 60 year old black female status post a right partial mastectomy for right breast cancer who now presents for both a Port-A-Cath insertion and evacuation of a wound hematoma that could not be aspirated in the office. The risks and benefits of both procedures including bleeding, infection, pneumothorax, and recurrence of the hematoma were fully explained to the patient, who gave informed consent.  Procedure note:  The patient was placed in the supine position. After induction of general endotracheal anesthesia, the anterior chest was prepped and draped using usual sterile technique with DuraPrep. Surgical site confirmation was performed.  We first used a Port-A-Cath. An incision was made below left clavicle. A subcutaneous pocket was then formed. A needle is advanced into the left subclavian vein while the patient was in Trendelenburg using the Seldinger technique without difficulty. A guidewire was then advanced into the right atrium under fluoroscopic guidance. An introducer and peel-away sheath were placed over the guidewire. The catheter was then inserted through the peel-away sheath and the peel-away sheath was removed. The catheter was then attached to the port and the port placed in subcutaneous pocket. Adequate positioning was confirmed by fluoroscopy. Good backflow of blood was noted on aspiration performed. The port was flushed with heparin flush. A power port was inserted. 0.5% Sensorcaine was instilled into the surrounding incision. The subcutaneous layer was reapproximated using 3-0 Vicryl interrupted suture. The skin was closed using a 4-0  Vicryl subcuticular suture. Dermabond was then applied at the end of both procedures.  Next, the evacuation of the surgical hematoma was performed in the right breast. An incision was made to the previous surgical incision site. Old clotted blood was evacuated. The cavity was irrigated with normal saline. Arista was placed into the cavity. Any bleeding was controlled using Bovie electrocautery. There was no evidence of purulence in the wound. The skin was reapproximated using 4-0 Prolene interrupted sutures. 0.5% Sensorcaine was instilled into the surrounding wound. Betadine ointment and dry sterile dressing were applied.  All tape and needle counts were correct at the end of the procedures. Patient was extubated in the operating room and transferred to PACU in stable condition.    Complications:  None  EBL:  25 mL  Specimen:  None

## 2016-10-26 NOTE — Anesthesia Procedure Notes (Signed)
Procedure Name: Intubation Date/Time: 10/26/2016 10:23 AM Performed by: Charmaine Downs Pre-anesthesia Checklist: Patient identified, Patient being monitored, Timeout performed, Emergency Drugs available and Suction available Patient Re-evaluated:Patient Re-evaluated prior to inductionOxygen Delivery Method: Circle System Utilized Preoxygenation: Pre-oxygenation with 100% oxygen Intubation Type: IV induction, Rapid sequence and Cricoid Pressure applied Ventilation: Mask ventilation without difficulty Laryngoscope Size: Mac and 3 Grade View: Grade II Tube type: Oral Tube size: 7.0 mm Number of attempts: 1 Airway Equipment and Method: stylet Placement Confirmation: ETT inserted through vocal cords under direct vision,  positive ETCO2 and breath sounds checked- equal and bilateral Secured at: 21 cm Tube secured with: Tape Dental Injury: Teeth and Oropharynx as per pre-operative assessment

## 2016-10-26 NOTE — Discharge Instructions (Signed)
Implanted Port Insertion An implanted port is a central line that has a round shape and is placed under the skin. It is used as a long-term IV access for:   Medicines, such as chemotherapy.   Fluids.   Liquid nutrition, such as total parenteral nutrition (TPN).   Blood samples.  LET [facility] CARE PROVIDER KNOW ABOUT:  Allergies to food or medicine.   Medicines taken, including vitamins, herbs, eye drops, creams, and over-the-counter medicines.   Any allergies to heparin.  Use of steroids (by mouth or creams).   Previous problems with anesthetics or numbing medicines.   History of bleeding problems or blood clots.   Previous surgery.   Other health problems, including diabetes and kidney problems.   Possibility of pregnancy, if this applies. RISKS AND COMPLICATIONS Generally, this is a safe procedure. However, as with any procedure, problems can occur. Possible problems include:  Damage to the blood vessel, bruising, or bleeding at the puncture site.   Infection.  Blood clot in the vessel that the port is in.  Breakdown of the skin over your port.  Very rarely a person may develop a condition called a pneumothorax, a collection of air in the chest that may cause one of the lungs to collapse. The placement of these catheters with the appropriate imaging guidance significantly decreases the risk of a pneumothorax.  BEFORE THE PROCEDURE   Your health care provider may want you to have blood tests. These tests can help tell how well your kidneys and liver are working. They can also show how well your blood clots.   If you take blood thinners (anticoagulant medicines), ask your health care provider when you should stop taking them.   Make arrangements for someone to drive you home. This is necessary if you have been sedated for your procedure.  PROCEDURE  Port insertion usually takes about 30-45 minutes.   An IV needle will be inserted in your arm.  Medicine for pain and medicine to help relax you (sedative) will flow directly into your body through this needle.   You will lie on an exam table, and you will be connected to monitors to keep track of your heart rate, blood pressure, and breathing throughout the procedure.  An oxygen monitoring device may be attached to your finger. Oxygen will be given.   Everything will be kept as germ free (sterile) as possible during the procedure. The skin near the point of the incision will be cleansed with antiseptic, and the area will be draped with sterile towels. The skin and deeper tissues over the port area will be made numb with a local anesthetic.  Two small cuts (incisions) will be made in the skin to insert the port. One will be made in the neck to obtain access to the vein where the catheter will lie.   Because the port reservoir will be placed under the skin, a small skin incision will be made in the upper chest, and a small pocket for the port will be made under the skin. The catheter that will be connected to the port tunnels to a large central vein in the chest. A small, raised area will remain on your body at the site of the reservoir when the procedure is complete.  The port placement will be done under imaging guidance to ensure the proper placement.  The reservoir has a silicone covering that can be punctured with a special needle.   The port will be flushed with normal  saline, and blood will be drawn to make sure it is working properly.  There will be nothing remaining outside the skin when the procedure is finished.   Incisions will be held together by stitches, surgical glue, or a special tape. AFTER THE PROCEDURE  You will stay in a recovery area until the anesthesia has worn off. Your blood pressure and pulse will be checked.  A final chest X-ray will be taken to check the placement of the port and to ensure that there is no injury to your lung. This information is not  intended to replace advice given to you by your health care provider. Make sure you discuss any questions you have with your health care provider. Document Released: 09/02/2013 Document Revised: 12/03/2014 Document Reviewed: 09/02/2013 Elsevier Interactive Patient Education  2017 [employer] An implanted port is a type of central line that is placed under the skin. Central lines are used to provide IV access when treatment or nutrition needs to be given through a person's veins. Implanted ports are used for long-term IV access. An implanted port may be placed because:   You need IV medicine that would be irritating to the small veins in your hands or arms.   You need long-term IV medicines, such as antibiotics.   You need IV nutrition for a long period.   You need frequent blood draws for lab tests.   You need dialysis.  Implanted ports are usually placed in the chest area, but they can also be placed in the upper arm, the abdomen, or the leg. An implanted port has two main parts:   Reservoir. The reservoir is round and will appear as a small, raised area under your skin. The reservoir is the part where a needle is inserted to give medicines or draw blood.   Catheter. The catheter is a thin, flexible tube that extends from the reservoir. The catheter is placed into a large vein. Medicine that is inserted into the reservoir goes into the catheter and then into the vein.  HOW WILL I CARE FOR MY INCISION SITE? Do not get the incision site wet. Bathe or shower as directed by your health care provider.  HOW IS MY PORT ACCESSED? Special steps must be taken to access the port:   Before the port is accessed, a numbing cream can be placed on the skin. This helps numb the skin over the port site.   Your health care provider uses a sterile technique to access the port.  Your health care provider must put on a mask and sterile gloves.  The skin over your port  is cleaned carefully with an antiseptic and allowed to dry.  The port is gently pinched between sterile gloves, and a needle is inserted into the port.  Only "non-coring" port needles should be used to access the port. Once the port is accessed, a blood return should be checked. This helps ensure that the port is in the vein and is not clogged.   If your port needs to remain accessed for a constant infusion, a clear (transparent) bandage will be placed over the needle site. The bandage and needle will need to be changed every week, or as directed by your health care provider.   Keep the bandage covering the needle clean and dry. Do not get it wet. Follow your health care provider's instructions on how to take a shower or bath while the port is accessed.   If your port  does not need to stay accessed, no bandage is needed over the port.  WHAT IS FLUSHING? Flushing helps keep the port from getting clogged. Follow your health care provider's instructions on how and when to flush the port. Ports are usually flushed with saline solution or a medicine called heparin. The need for flushing will depend on how the port is used.   If the port is used for intermittent medicines or blood draws, the port will need to be flushed:   After medicines have been given.   After blood has been drawn.   As part of routine maintenance.   If a constant infusion is running, the port may not need to be flushed.  HOW LONG WILL MY PORT STAY IMPLANTED? The port can stay in for as long as your health care provider thinks it is needed. When it is time for the port to come out, surgery will be done to remove it. The procedure is similar to the one performed when the port was put in.  WHEN SHOULD I SEEK IMMEDIATE MEDICAL CARE? When you have an implanted port, you should seek immediate medical care if:   You notice a bad smell coming from the incision site.   You have swelling, redness, or drainage at the  incision site.   You have more swelling or pain at the port site or the surrounding area.   You have a fever that is not controlled with medicine. This information is not intended to replace advice given to you by your health care provider. Make sure you discuss any questions you have with your health care provider. Document Released: 11/12/2005 Document Revised: 09/02/2013 Document Reviewed: 07/20/2013 Elsevier Interactive Patient Education  2017 [employer].

## 2016-10-26 NOTE — Anesthesia Preprocedure Evaluation (Addendum)
Anesthesia Evaluation  Patient identified by MRN, date of birth, ID band Patient awake    Reviewed: Allergy & Precautions, H&P , NPO status , Patient's Chart, lab work & pertinent test results  Airway Mallampati: II  TM Distance: >3 FB Neck ROM: Full    Dental  (+) Teeth Intact   Pulmonary neg pulmonary ROS,    breath sounds clear to auscultation       Cardiovascular hypertension, Pt. on medications  Rhythm:Regular Rate:Normal     Neuro/Psych    GI/Hepatic negative GI ROS, GERD  Medicated and Poorly Controlled,  Endo/Other    Renal/GU      Musculoskeletal   Abdominal   Peds  Hematology   Anesthesia Other Findings   Reproductive/Obstetrics                             Anesthesia Physical Anesthesia Plan  ASA: III  Anesthesia Plan: General   Post-op Pain Management:    Induction: Intravenous, Rapid sequence and Cricoid pressure planned  Airway Management Planned: Oral ETT  Additional Equipment:   Intra-op Plan:   Post-operative Plan: Extubation in OR  Informed Consent: I have reviewed the patients History and Physical, chart, labs and discussed the procedure including the risks, benefits and alternatives for the proposed anesthesia with the patient or authorized representative who has indicated his/her understanding and acceptance.     Plan Discussed with:   Anesthesia Plan Comments:        Anesthesia Quick Evaluation

## 2016-10-26 NOTE — Addendum Note (Signed)
Addendum  created 10/26/16 1356 by Charmaine Downs, CRNA   Sign clinical note

## 2016-10-26 NOTE — Interval H&P Note (Signed)
History and Physical Interval Note:  10/26/2016 9:57 AM  Kimberly Roman  has presented today for surgery, with the diagnosis of right breast cancer  The various methods of treatment have been discussed with the patient and family. After consideration of risks, benefits and other options for treatment, the patient has consented to  Procedure(s): INSERTION PORT-A-CATH (Left) EVACUATION HEMATOMA (Right) as a surgical intervention .  The patient's history has been reviewed, patient examined, no change in status, stable for surgery.  I have reviewed the patient's chart and labs.  Questions were answered to the patient's satisfaction.     Aviva Signs A

## 2016-10-30 ENCOUNTER — Encounter (HOSPITAL_COMMUNITY): Payer: Self-pay | Admitting: General Surgery

## 2016-10-30 ENCOUNTER — Encounter: Payer: Self-pay | Admitting: *Deleted

## 2016-10-30 NOTE — Progress Notes (Signed)
[place] Clinical Social Work  Clinical Social Work was referred by pt to assist with [facility] in [place]. CSW and pt met at length to complete applications. CSW working to finish completion and then will mail to organizations.    Clinical Social Work interventions: Resource assistance     Loren Racer, [place] Tuesdays   [phone]

## 2016-11-02 ENCOUNTER — Encounter (HOSPITAL_COMMUNITY): Payer: BLUE CROSS/BLUE SHIELD | Attending: Hematology & Oncology

## 2016-11-02 VITALS — BP 109/77 | HR 79 | Temp 98.4°F | Resp 18 | Wt 148.6 lb

## 2016-11-02 DIAGNOSIS — Z5189 Encounter for other specified aftercare: Secondary | ICD-10-CM

## 2016-11-02 DIAGNOSIS — C50411 Malignant neoplasm of upper-outer quadrant of right female breast: Secondary | ICD-10-CM | POA: Diagnosis not present

## 2016-11-02 DIAGNOSIS — E876 Hypokalemia: Secondary | ICD-10-CM | POA: Insufficient documentation

## 2016-11-02 DIAGNOSIS — D72825 Bandemia: Secondary | ICD-10-CM | POA: Diagnosis present

## 2016-11-02 DIAGNOSIS — Z5111 Encounter for antineoplastic chemotherapy: Secondary | ICD-10-CM

## 2016-11-02 DIAGNOSIS — C50911 Malignant neoplasm of unspecified site of right female breast: Secondary | ICD-10-CM

## 2016-11-02 LAB — CBC WITH DIFFERENTIAL/PLATELET
Basophils Absolute: 0.1 10*3/uL (ref 0.0–0.1)
Basophils Relative: 1 %
Eosinophils Absolute: 0.1 10*3/uL (ref 0.0–0.7)
Eosinophils Relative: 1 %
HCT: 35.9 % — ABNORMAL LOW (ref 36.0–46.0)
Hemoglobin: 11.5 g/dL — ABNORMAL LOW (ref 12.0–15.0)
Lymphocytes Relative: 19 %
Lymphs Abs: 1.3 10*3/uL (ref 0.7–4.0)
MCH: 29.5 pg (ref 26.0–34.0)
MCHC: 32 g/dL (ref 30.0–36.0)
MCV: 92.1 fL (ref 78.0–100.0)
Monocytes Absolute: 0.6 10*3/uL (ref 0.1–1.0)
Monocytes Relative: 9 %
Neutro Abs: 4.9 10*3/uL (ref 1.7–7.7)
Neutrophils Relative %: 70 %
Platelets: 307 10*3/uL (ref 150–400)
RBC: 3.9 MIL/uL (ref 3.87–5.11)
RDW: 13.6 % (ref 11.5–15.5)
WBC: 7 10*3/uL (ref 4.0–10.5)

## 2016-11-02 LAB — COMPREHENSIVE METABOLIC PANEL
ALT: 13 U/L — ABNORMAL LOW (ref 14–54)
AST: 18 U/L (ref 15–41)
Albumin: 4.1 g/dL (ref 3.5–5.0)
Alkaline Phosphatase: 78 U/L (ref 38–126)
Anion gap: 7 (ref 5–15)
BUN: 10 mg/dL (ref 6–20)
CO2: 30 mmol/L (ref 22–32)
Calcium: 9.5 mg/dL (ref 8.9–10.3)
Chloride: 105 mmol/L (ref 101–111)
Creatinine, Ser: 0.58 mg/dL (ref 0.44–1.00)
GFR calc Af Amer: >60 mL/min (ref >60)
GFR calc non Af Amer: >60 mL/min (ref >60)
Glucose, Bld: 81 mg/dL (ref 65–99)
Potassium: 3.1 mmol/L — ABNORMAL LOW (ref 3.5–5.1)
Sodium: 142 mmol/L (ref 135–145)
Total Bilirubin: 0.4 mg/dL (ref 0.3–1.2)
Total Protein: 7.4 g/dL (ref 6.5–8.1)

## 2016-11-02 MED ORDER — HEPARIN SOD (PORK) LOCK FLUSH 100 UNIT/ML IV SOLN
500.0000 [IU] | Freq: Once | INTRAVENOUS | Status: AC | PRN
  Administered 2016-11-02: 500 [IU]
  Filled 2016-11-02 (×2): qty 5

## 2016-11-02 MED ORDER — DOXORUBICIN HCL CHEMO IV INJECTION 2 MG/ML
60.0000 mg/m2 | Freq: Once | INTRAVENOUS | Status: AC
  Administered 2016-11-02: 106 mg via INTRAVENOUS
  Filled 2016-11-02: qty 53

## 2016-11-02 MED ORDER — SODIUM CHLORIDE 0.9 % IV SOLN
600.0000 mg/m2 | Freq: Once | INTRAVENOUS | Status: AC
  Administered 2016-11-02: 1060 mg via INTRAVENOUS
  Filled 2016-11-02: qty 50

## 2016-11-02 MED ORDER — PEGFILGRASTIM 6 MG/0.6ML ~~LOC~~ PSKT
6.0000 mg | PREFILLED_SYRINGE | Freq: Once | SUBCUTANEOUS | Status: AC
  Administered 2016-11-02: 6 mg via SUBCUTANEOUS
  Filled 2016-11-02: qty 0.6

## 2016-11-02 MED ORDER — POTASSIUM CHLORIDE CRYS ER 20 MEQ PO TBCR: 20.0000 meq | EXTENDED_RELEASE_TABLET | Freq: Every day | ORAL | 2 refills | Status: DC

## 2016-11-02 MED ORDER — SODIUM CHLORIDE 0.9% FLUSH
10.0000 mL | INTRAVENOUS | Status: DC | PRN
  Administered 2016-11-02: 10 mL
  Filled 2016-11-02: qty 10

## 2016-11-02 MED ORDER — PALONOSETRON HCL INJECTION 0.25 MG/5ML
0.2500 mg | Freq: Once | INTRAVENOUS | Status: AC
  Administered 2016-11-02: 0.25 mg via INTRAVENOUS

## 2016-11-02 MED ORDER — SODIUM CHLORIDE 0.9 % IV SOLN
Freq: Once | INTRAVENOUS | Status: AC
  Administered 2016-11-02: 10:00:00 via INTRAVENOUS

## 2016-11-02 MED ORDER — SODIUM CHLORIDE 0.9 % IV SOLN
Freq: Once | INTRAVENOUS | Status: AC
  Administered 2016-11-02: 10:00:00 via INTRAVENOUS
  Filled 2016-11-02: qty 5

## 2016-11-02 MED ORDER — POTASSIUM CHLORIDE CRYS ER 20 MEQ PO TBCR
60.0000 meq | EXTENDED_RELEASE_TABLET | Freq: Two times a day (BID) | ORAL | Status: DC
  Administered 2016-11-02: 60 meq via ORAL
  Filled 2016-11-02: qty 3

## 2016-11-02 NOTE — Progress Notes (Signed)
Kimberly Roman tolerated chemo tx and Neulasta on-pro well without complaints or incident. Labs,CXR AND echo results reviewed prior to administering chemotherapy. K+ 3.1,shown to Kimberly Crigler PA who ordered K-DUR 60 meq PO now and K-DUR 20 meq PO daily starting tomorrow. Pt verbalized understanding. Reviewed purpose and side effects of chemo medications and Neulasta on-pro with pt who verbalized understanding. VSS upon discharge. Neulasta on-pro was applied to left arm with the green light indicator flashing. Pt discharged self ambulatory in satisfactory condition with family member

## 2016-11-02 NOTE — Patient Instructions (Signed)
[facility] Discharge Instructions for Patients Receiving Chemotherapy   Beginning January 23rd 2017 lab work for the Cancer Center will be done in the  Main lab at [facility] on 1st floor. If you have a lab appointment with the Cancer Center please come in thru the  Main Entrance and check in at the main information desk   Today you received the following chemotherapy agents Adriamycin and Cytoxan as well as Neulasta on-pro. Follow-up as scheduled. Call clinic for any questions or concerns  To help prevent nausea and vomiting after your treatment, we encourage you to take your nausea medication   If you develop nausea and vomiting, or diarrhea that is not controlled by your medication, call the clinic.  The clinic phone number is [phone]. Office hours are Monday-Friday 8:30am-5:00pm.  BELOW ARE SYMPTOMS THAT SHOULD BE REPORTED IMMEDIATELY:  *FEVER GREATER THAN 101.0 F  *CHILLS WITH OR WITHOUT FEVER  NAUSEA AND VOMITING THAT IS NOT CONTROLLED WITH YOUR NAUSEA MEDICATION  *UNUSUAL SHORTNESS OF BREATH  *UNUSUAL BRUISING OR BLEEDING  TENDERNESS IN MOUTH AND THROAT WITH OR WITHOUT PRESENCE OF ULCERS  *URINARY PROBLEMS  *BOWEL PROBLEMS  UNUSUAL RASH Items with * indicate a potential emergency and should be followed up as soon as possible. If you have an emergency after office hours please contact your primary care physician or go to the nearest emergency department.  Please call the clinic during office hours if you have any questions or concerns.   You may also contact the Patient Navigator at [phone] should you have any questions or need assistance in obtaining follow up care.      Resources For Cancer Patients and their Caregivers ? American Cancer Society: Can assist with transportation, wigs, general needs, runs Look Good Feel Better.        1-888-227-6333 ? Cancer Care: Provides financial assistance, online support groups,  medication/co-pay assistance.  1-800-813-HOPE (4673) ? [facility] Assists [place] cancer patients and their families through emotional , educational and financial support.  336-427-4357 ? [place] DSS Where to apply for food stamps, Medicaid and utility assistance. 336-342-1394 ? [facility]: Transportation to medical appointments. 336-347-2287 ? Social Security Administration: May apply for disability if have a Stage IV cancer. 336-342-7796 1-800-772-1213 ? [place] [facility] Transit [facility]: Assists with nutrition, care and transit needs. 336-349-2343         

## 2016-11-05 ENCOUNTER — Telehealth (HOSPITAL_COMMUNITY): Payer: Self-pay

## 2016-11-05 NOTE — Telephone Encounter (Signed)
See telephone call notes

## 2016-11-08 ENCOUNTER — Other Ambulatory Visit (HOSPITAL_COMMUNITY): Payer: Self-pay | Admitting: Emergency Medicine

## 2016-11-09 ENCOUNTER — Encounter (HOSPITAL_COMMUNITY): Payer: Self-pay | Admitting: Hematology & Oncology

## 2016-11-09 ENCOUNTER — Encounter (HOSPITAL_BASED_OUTPATIENT_CLINIC_OR_DEPARTMENT_OTHER): Payer: BLUE CROSS/BLUE SHIELD | Admitting: Hematology & Oncology

## 2016-11-09 VITALS — BP 126/69 | HR 85 | Temp 98.0°F | Resp 16 | Wt 148.0 lb

## 2016-11-09 DIAGNOSIS — C50911 Malignant neoplasm of unspecified site of right female breast: Secondary | ICD-10-CM

## 2016-11-09 DIAGNOSIS — Z171 Estrogen receptor negative status [ER-]: Secondary | ICD-10-CM

## 2016-11-09 DIAGNOSIS — C50411 Malignant neoplasm of upper-outer quadrant of right female breast: Secondary | ICD-10-CM

## 2016-11-09 DIAGNOSIS — C50919 Malignant neoplasm of unspecified site of unspecified female breast: Secondary | ICD-10-CM

## 2016-11-09 DIAGNOSIS — K219 Gastro-esophageal reflux disease without esophagitis: Secondary | ICD-10-CM

## 2016-11-09 MED ORDER — HEPARIN SOD (PORK) LOCK FLUSH 100 UNIT/ML IV SOLN
INTRAVENOUS | Status: AC
  Filled 2016-11-09: qty 5

## 2016-11-09 NOTE — Progress Notes (Signed)
[facility]  CONSULT NOTE  Patient Care Team: Rosita Fire, MD as PCP - General (Internal Medicine)  CHIEF COMPLAINTS/PURPOSE OF CONSULTATION:  Invasive breast cancer   Invasive ductal carcinoma of breast ([place])   08/13/2016 Mammogram    BI-RADS CATEGORY  0: Incomplete.      08/21/2016 Mammogram    BI-RADS CATEGORY  5: Highly suggestive of malignancy. Suspicious 1.2 cm mass 10 o'clock position right breast.      08/21/2016 Breast US    Targeted ultrasound is performed, showing a hypoechoic irregular mass with indistinct margins at 10 o'clock position 9 cm from the nipple measuring approximately 1.1 x 1.2 x 1.0 cm. No additional masses are seen in this region of the right breast on ultrasound.      08/28/2016 Procedure    Right needle core biopsy      09/26/2016 Procedure    Right upper outer quadrant lumpectomy by Dr. Arnoldo Morale      09/28/2016 Pathology Results    Invasive ductal carcinoma, grade 3, standing 1.7 cm. High-grade ductal carcinoma in situ with comedonecrosis. Invasive carcinoma in less than 0.1 cm of the superior medial margin focally. In situ carcinoma is less than 0.1 cm of superior medial margin and the posterior margin focally. No LVI. ER negative. PR negative. HER-2 negative. KI-67 90%.      09/28/2016 Cancer Staging    Invasive ductal carcinoma of breast [facility])   Staging form: Breast, AJCC 7th Edition   - Clinical stage from 09/28/2016: Stage IA (T1c, N0, M0) - Signed by Baird Cancer, PA-C on 10/22/2016      09/29/2016 Pathology Results    Invasive ductal carcinoma, grade 2/3. HER-2 negative. ER negative. PR negative. Ki-67 90%.      10/24/2016 Echocardiogram    - Left ventricle: The cavity size was normal. Wall thickness was   normal. Systolic function was normal. The estimated ejection   fraction was in the range of 60% to 65%. Wall motion was normal;   there were no regional wall motion abnormalities.       10/26/2016 Procedure   Port placed placed by Dr. Arnoldo Morale      11/02/2016 -  Chemotherapy    The patient had DOXOrubicin (ADRIAMYCIN) chemo injection 106 mg, 60 mg/m2 = 106 mg, Intravenous,  Once, 1 of 4 cycles  palonosetron (ALOXI) injection 0.25 mg, 0.25 mg, Intravenous,  Once, 1 of 4 cycles  pegfilgrastim (NEULASTA ONPRO KIT) injection 6 mg, 6 mg, Subcutaneous, Once, 1 of 4 cycles  cyclophosphamide (CYTOXAN) 1,060 mg in sodium chloride 0.9 % 250 mL chemo infusion, 600 mg/m2 = 1,060 mg, Intravenous,  Once, 1 of 4 cycles  fosaprepitant (EMEND) 150 mg, dexamethasone (DECADRON) 12 mg in sodium chloride 0.9 % 145 mL IVPB, , Intravenous,  Once, 1 of 4 cycles  for chemotherapy treatment.         HISTORY OF PRESENTING ILLNESS:  Kimberly Roman 60 y.o. female is here for follow-up of stage I triple negative breast cancer.  Patient received her first cycle of Adriamycin and Cytoxan on 11/02/2016. She tolerated treatment well. Denies nausea, vomiting, or diarrhea. Denies a change in mood. She feels that she did well overall with therapy. She denies mouth sores. No fever or chills.   Reports some pain in upper and lower extremities that has now subsided. Also reports tingling in her scalp.  Appetite is ok. She has no other complaints or questions today. Presents for a nadir check today.  MEDICAL HISTORY:  Past Medical History:  Diagnosis Date  . Ankylosing spondylitis ([place])   . Arthritis   . Breast cancer ([place])   . Carpal tunnel syndrome   . Environmental allergies   . GERD (gastroesophageal reflux disease)   . Hyperlipemia   . Hypertension   . Invasive ductal carcinoma of breast ([place]) 10/22/2016    SURGICAL HISTORY: Past Surgical History:  Procedure Laterality Date  . ABDOMINAL HYSTERECTOMY    . CARPAL TUNNEL RELEASE Right 04/27/2013   Procedure: RIGHT CARPAL TUNNEL RELEASE;  Surgeon: Carole Civil, MD;  Location: [facility] ORS;  Service: Orthopedics;  Laterality: Right;  . COLONOSCOPY N/A 01/20/2013     Procedure: COLONOSCOPY;  Surgeon: Danie Binder, MD;  Location: [facility] ENDO SUITE;  Service: Endoscopy;  Laterality: N/A;  9:30 AM  . HEMATOMA EVACUATION Right 10/26/2016   Procedure: EVACUATION HEMATOMA;  Surgeon: Aviva Signs, MD;  Location: [facility] ORS;  Service: General;  Laterality: Right;  . PARTIAL MASTECTOMY WITH AXILLARY SENTINEL LYMPH NODE BIOPSY Right 09/26/2016   Procedure: PARTIAL MASTECTOMY WITH AXILLARY SENTINEL LYMPH NODE BIOPSY;  Surgeon: Aviva Signs, MD;  Location: [facility] ORS;  Service: General;  Laterality: Right;  . PORTACATH PLACEMENT Left 10/26/2016   Procedure: INSERTION PORT-A-CATH;  Surgeon: Aviva Signs, MD;  Location: [facility];  Service: General;  Laterality: Left;    SOCIAL HISTORY: Social History   Social History  . Marital status: Single    Spouse name: N/A  . Number of children: N/A  . Years of education: N/A   Occupational History  . Not on file.   Social History Main Topics  . Smoking status: Never Smoker  . Smokeless tobacco: Never Used  . Alcohol use No  . Drug use: No  . Sexual activity: Not on file   Other Topics Concern  . Not on file   Social History Narrative  . No narrative on file  2 brothers.  Father passed at 77 Not married, no children She is a counseling agent. She enjoys crochet and reading Nonsmoker, no EtOH Born in [place]: Family History  Problem Relation Age of Onset  . Arthritis    . Hypertension Mother   . Hypertension Father   . Colon cancer Neg Hx     ALLERGIES:  is allergic to lipitor [atorvastatin calcium].  MEDICATIONS:  Current Outpatient Prescriptions  Medication Sig Dispense Refill  . acetaminophen (TYLENOL) 650 MG CR tablet Take 650 mg by mouth every 8 (eight) hours as needed for pain.    Marland Kitchen amLODipine (NORVASC) 5 MG tablet Take 5 mg by mouth every morning.    . Artificial Tear Solution (SOOTHE XP) SOLN Apply 2 drops to eye 3 (three) times daily as needed (for dry/irritated eyes).      . CYCLOPHOSPHAMIDE IV Inject into the vein. Every 2 weeks    . diphenhydramine-acetaminophen (TYLENOL PM) 25-500 MG TABS tablet Take 1 tablet by mouth at bedtime as needed (for pain.).     Marland Kitchen DOXOrubicin HCl (ADRIAMYCIN IV) Inject into the vein. Every 2 weeks    . lidocaine-prilocaine (EMLA) cream Apply to affected area once 30 g 3  . lisinopril-hydrochlorothiazide (PRINZIDE,ZESTORETIC) 20-12.5 MG tablet Take 1 tablet by mouth every morning.  2  . omeprazole (PRILOSEC) 20 MG capsule Take 20 mg by mouth every morning.   0  . ondansetron (ZOFRAN) 8 MG tablet Take 1 tablet (8 mg total) by mouth 2 (two) times daily as needed. Start on the  third day after chemotherapy. 30 tablet 1  . Pegfilgrastim (NEULASTA ONPRO [place]) Inject into the skin. Every 2 weeks    . potassium chloride SA (K-DUR,KLOR-CON) 20 MEQ tablet Take 1 tablet (20 mEq total) by mouth daily. 30 tablet 2  . prochlorperazine (COMPAZINE) 10 MG tablet Take 1 tablet (10 mg total) by mouth every 6 (six) hours as needed (Nausea or vomiting). 30 tablet 1  . sodium chloride (OCEAN) 0.65 % SOLN nasal spray Place 1 spray into both nostrils 4 (four) times daily as needed (for dry/congestion/allergies.).     No current facility-administered medications for this visit.     Review of Systems  Constitutional: Negative.   HENT: Negative.   Eyes: Negative.   Respiratory: Negative.   Cardiovascular: Negative.   Gastrointestinal: Negative.   Genitourinary: Negative.   Musculoskeletal: Negative.   Skin: Negative.   Neurological: Positive for tingling.       Tingling in scalp  Endo/Heme/Allergies: Negative.   Psychiatric/Behavioral: Negative.   All other systems reviewed and are negative. 14 point ROS was done and is otherwise as detailed above or in HPI   PHYSICAL EXAMINATION: ECOG PERFORMANCE STATUS: 0 - Asymptomatic  Vitals:   11/09/16 0937  BP: 126/69  Pulse: 85  Resp: 16  Temp: 98 F (36.7 C)   Filed Weights   11/09/16 0937   Weight: 148 lb (67.1 kg)    Physical Exam  Constitutional: She is oriented to person, place, and time and well-developed, well-nourished, and in no distress.  HENT:  Head: Normocephalic and atraumatic.  Mouth/Throat: Oropharynx is clear and moist.  Eyes: Conjunctivae and EOM are normal. Pupils are equal, round, and reactive to light. No scleral icterus.  Neck: Normal range of motion. Neck supple.  Cardiovascular: Normal rate, regular rhythm and normal heart sounds.   Pulmonary/Chest: Effort normal and breath sounds normal.  Abdominal: Soft. Bowel sounds are normal. She exhibits no distension and no mass. There is no tenderness. There is no rebound and no guarding.  Musculoskeletal: Normal range of motion.  Lymphadenopathy:    She has no cervical adenopathy.  Neurological: She is alert and oriented to person, place, and time. Gait normal.  Skin: Skin is warm and dry.  Psychiatric: Mood, memory, affect and judgment normal.  Nursing note and vitals reviewed.  LABORATORY DATA:  I have reviewed the data as listed Results for JAIDYNN, BALSTER (MRN [id number])   Ref. Range 11/02/2016 08:56  Sodium Latest Ref Range: 135 - 145 mmol/L 142  Potassium Latest Ref Range: 3.5 - 5.1 mmol/L 3.1 (L)  Chloride Latest Ref Range: 101 - 111 mmol/L 105  CO2 Latest Ref Range: 22 - 32 mmol/L 30  BUN Latest Ref Range: 6 - 20 mg/dL 10  Creatinine Latest Ref Range: 0.44 - 1.00 mg/dL 0.58  Calcium Latest Ref Range: 8.9 - 10.3 mg/dL 9.5  EGFR (Non-African Amer.) Latest Ref Range: >60 mL/min >60  EGFR (African American) Latest Ref Range: >60 mL/min >60  Glucose Latest Ref Range: 65 - 99 mg/dL 81  Anion gap Latest Ref Range: 5 - 15  7  Alkaline Phosphatase Latest Ref Range: 38 - 126 U/L 78  Albumin Latest Ref Range: 3.5 - 5.0 g/dL 4.1  AST Latest Ref Range: 15 - 41 U/L 18  ALT Latest Ref Range: 14 - 54 U/L 13 (L)  Total Protein Latest Ref Range: 6.5 - 8.1 g/dL 7.4  Total Bilirubin Latest Ref Range: 0.3  - 1.2 mg/dL 0.4  WBC Latest Ref  Range: 4.0 - 10.5 K/uL 7.0  RBC Latest Ref Range: 3.87 - 5.11 MIL/uL 3.90  Hemoglobin Latest Ref Range: 12.0 - 15.0 g/dL 11.5 (L)  HCT Latest Ref Range: 36.0 - 46.0 % 35.9 (L)  MCV Latest Ref Range: 78.0 - 100.0 fL 92.1  MCH Latest Ref Range: 26.0 - 34.0 pg 29.5  MCHC Latest Ref Range: 30.0 - 36.0 g/dL 32.0  RDW Latest Ref Range: 11.5 - 15.5 % 13.6  Platelets Latest Ref Range: 150 - 400 K/uL 307  Neutrophils Latest Units: % 70  Lymphocytes Latest Units: % 19  Monocytes Relative Latest Units: % 9  Eosinophil Latest Units: % 1  Basophil Latest Units: % 1  NEUT# Latest Ref Range: 1.7 - 7.7 K/uL 4.9  Lymphocyte # Latest Ref Range: 0.7 - 4.0 K/uL 1.3  Monocyte # Latest Ref Range: 0.1 - 1.0 K/uL 0.6  Eosinophils Absolute Latest Ref Range: 0.0 - 0.7 K/uL 0.1  Basophils Absolute Latest Ref Range: 0.0 - 0.1 K/uL 0.1    PATHOLOGY:    RADIOGRAPHIC STUDIES: I have personally reviewed the radiological images as listed and agreed with the findings in the report. No results found. Study Result   CLINICAL DATA:  History of breast malignancy, hypertension, and hyperlipidemia.  EXAM: CHEST  2 VIEW  COMPARISON:  No recent studies in PACs  FINDINGS: The lungs are mildly hyperinflated and clear. The heart and pulmonary vascularity are normal. The mediastinum is normal in width. There is no pleural effusion. No pulmonary parenchymal nodules or masses are observed. No mediastinal or hilar lymphadenopathy is demonstrated. The bony thorax exhibits no acute abnormality.  IMPRESSION: Mild hyperinflation consistent with COPD or reactive airway disease. No acute cardiopulmonary abnormality.   Electronically Signed   By: David  Martinique M.D.   On: 09/25/2016 07:07    Study Result   CLINICAL DATA:  Mass right breast identified on recent 2D screening mammogram.  EXAM: 2D DIGITAL DIAGNOSTIC RIGHT MAMMOGRAM WITH CAD AND ADJUNCT TOMO  ULTRASOUND  RIGHT BREAST  COMPARISON:  Previous exam(s).  ACR Breast Density Category b: There are scattered areas of fibroglandular density.  FINDINGS: The whole breast CC and MLO views including tomography are performed. There is a spiculated mass in the posterior third of the upper outer quadrant of the right breast.  Mammographic images were processed with CAD.  On physical exam, no definite mass is palpated in the upper-outer quadrant of the right breast.  Targeted ultrasound is performed, showing a hypoechoic irregular mass with indistinct margins at 10 o'clock position 9 cm from the nipple measuring approximately 1.1 x 1.2 x 1.0 cm. No additional masses are seen in this region of the right breast on ultrasound.  Ultrasound of the right axilla is negative for lymphadenopathy.  IMPRESSION: Suspicious 1.2 cm mass 10 o'clock position right breast.  RECOMMENDATION: Ultrasound-guided core needle biopsy is recommended and has been discussed with the patient. Biopsy has been scheduled for August 28, 2016 at 1 o'clock p.m.  I have discussed the findings and recommendations with the patient. Results were also provided in writing at the conclusion of the visit. If applicable, a reminder letter will be sent to the patient regarding the next appointment.  BI-RADS CATEGORY  5: Highly suggestive of malignancy.   Electronically Signed   By: Curlene Dolphin M.D.   On: 08/21/2016 15:21     ASSESSMENT & PLAN:  Invasive ductal carcinoma of breast ([place])   Staging form: Breast, AJCC 7th Edition   - Clinical  stage from 09/28/2016: Stage IA (T1c, N0, M0) - Signed by Baird Cancer, PA-C on 10/22/2016 Triple negative carcinoma of R breast Stage 1 Ankylosing spondylitis GERD  Patient is pleasant and tolerating treatment well. She will receive cycle 2 of ADRIAMYCIN/CYTOXAN on 11/16/16.   Explained to patient that tingling in her scalp is normal and hair loss will follow.  Recommended the Journey Room for wigs/scarves. Will arrange for her to be taken down today.   Reminded Breeann to pay close attention to side effects, nausea, vomting, diarrhea and notify me if she experiences any new or worsening symptoms.   Recommended she remain active throughout treatment.   Follow up in one week with cycle #2 of treatment. She has Jennifer's number and knows to contact her with problems or concerns as well.   All questions were answered. The patient knows to call the clinic with any problems, questions or concerns.  This document serves as a record of services personally performed by Ancil Linsey, MD. It was created on her behalf by Elmyra Ricks, a trained medical scribe. The creation of this record is based on the scribe's personal observations and the provider's statements to them. This document has been checked and approved by the attending provider.  I have reviewed the above documentation for accuracy and completeness and I agree with the above.  This note was electronically signed.    Patrici Ranks, MD 11/23/2016 10:39 PM

## 2016-11-09 NOTE — Patient Instructions (Signed)
[place] at [facility] Discharge Instructions  RECOMMENDATIONS MADE BY THE CONSULTANT AND ANY TEST RESULTS WILL BE SENT TO YOUR REFERRING PHYSICIAN.  You saw Dr.Penland today. Go to journey room to see hair pieces. Keep scheduled appointment. See Amy at checkout for appointments.  Thank you for choosing [place] at [facility] to provide your oncology and hematology care.  To afford each patient quality time with our provider, please arrive at least 15 minutes before your scheduled appointment time.   Beginning January 23rd 2017 lab work for the [facility] will be done in the  Main lab at [facility] on 1st floor. If you have a lab appointment with the [facility] please come in thru the  Main Entrance and check in at the main information desk  You need to re-schedule your appointment should you arrive 10 or more minutes late.  We strive to give you quality time with our providers, and arriving late affects you and other patients whose appointments are after yours.  Also, if you no show three or more times for appointments you may be dismissed from the clinic at the providers discretion.     Again, thank you for choosing [facility].  Our hope is that these requests will decrease the amount of time that you wait before being seen by our physicians.       _____________________________________________________________  Should you have questions after your visit to [facility], please contact our office at [phone] between the hours of 8:30 a.m. and 4:30 p.m.  Voicemails left after 4:30 p.m. will not be returned until the following business day.  For prescription refill requests, have your pharmacy contact our office.         Resources For Cancer Patients and their Caregivers ? American Cancer Society: Can assist with transportation, wigs, general needs, runs Look Good Feel Better.         (270)107-1506 ? Cancer Care: Provides financial assistance, online support groups, medication/co-pay assistance.  1-800-813-HOPE 408-058-5542) ? [place] Assists [place] cancer patients and their families through emotional , educational and financial support.  228-546-7678 ? [place] DSS Where to apply for food stamps, Medicaid and utility assistance. (416)149-3792 ? RCATS: Transportation to medical appointments. 505-237-9458 ? Social Security Administration: May apply for disability if have a Stage IV cancer. 778-399-5861 3253319109 ? [employer], Disability and Transit Services: Assists with nutrition, care and transit needs. [place] Support Programs: @10RELATIVEDAYS @ > Cancer Support Group  2nd Tuesday of the month 1pm-2pm, Journey Room  > Creative Journey  3rd Tuesday of the month 1130am-1pm, Journey Room  > Look Good Feel Better  1st Wednesday of the month 10am-12 noon, Journey Room (Call [place] to register [phone])

## 2016-11-09 NOTE — Progress Notes (Signed)
Went to se pt while she was here for her follow up visit with Dr Whitney Muse after her first chemotherapy.  She states that she does not like using the biotene that it triggers her hot flashes.  I told her that she did not have to use it, she could try [facility] instead.  We went to the journey room for her to pick out a wig.  She picked out a cute wig that looked really good on her.

## 2016-11-16 ENCOUNTER — Encounter (HOSPITAL_BASED_OUTPATIENT_CLINIC_OR_DEPARTMENT_OTHER): Payer: BLUE CROSS/BLUE SHIELD | Admitting: Oncology

## 2016-11-16 ENCOUNTER — Encounter (HOSPITAL_COMMUNITY): Payer: Self-pay

## 2016-11-16 ENCOUNTER — Encounter (HOSPITAL_BASED_OUTPATIENT_CLINIC_OR_DEPARTMENT_OTHER): Payer: BLUE CROSS/BLUE SHIELD

## 2016-11-16 VITALS — BP 104/55 | HR 72 | Temp 98.0°F | Resp 18 | Wt 148.0 lb

## 2016-11-16 DIAGNOSIS — D72825 Bandemia: Secondary | ICD-10-CM

## 2016-11-16 DIAGNOSIS — Z5111 Encounter for antineoplastic chemotherapy: Secondary | ICD-10-CM | POA: Diagnosis not present

## 2016-11-16 DIAGNOSIS — C50911 Malignant neoplasm of unspecified site of right female breast: Secondary | ICD-10-CM | POA: Diagnosis not present

## 2016-11-16 DIAGNOSIS — C50411 Malignant neoplasm of upper-outer quadrant of right female breast: Secondary | ICD-10-CM | POA: Diagnosis not present

## 2016-11-16 DIAGNOSIS — Z5189 Encounter for other specified aftercare: Secondary | ICD-10-CM

## 2016-11-16 DIAGNOSIS — E876 Hypokalemia: Secondary | ICD-10-CM

## 2016-11-16 LAB — COMPREHENSIVE METABOLIC PANEL
ALT: 18 U/L (ref 14–54)
AST: 16 U/L (ref 15–41)
Albumin: 3.8 g/dL (ref 3.5–5.0)
Alkaline Phosphatase: 86 U/L (ref 38–126)
Anion gap: 6 (ref 5–15)
BUN: 6 mg/dL (ref 6–20)
CO2: 29 mmol/L (ref 22–32)
Calcium: 9.3 mg/dL (ref 8.9–10.3)
Chloride: 106 mmol/L (ref 101–111)
Creatinine, Ser: 0.55 mg/dL (ref 0.44–1.00)
GFR calc Af Amer: >60 mL/min (ref >60)
GFR calc non Af Amer: >60 mL/min (ref >60)
Glucose, Bld: 98 mg/dL (ref 65–99)
Potassium: 3.2 mmol/L — ABNORMAL LOW (ref 3.5–5.1)
Sodium: 141 mmol/L (ref 135–145)
Total Bilirubin: 0.4 mg/dL (ref 0.3–1.2)
Total Protein: 7.2 g/dL (ref 6.5–8.1)

## 2016-11-16 LAB — URINALYSIS, ROUTINE W REFLEX MICROSCOPIC
Bilirubin Urine: NEGATIVE
Glucose, UA: NEGATIVE mg/dL
Hgb urine dipstick: NEGATIVE
Ketones, ur: NEGATIVE mg/dL
Leukocytes, UA: NEGATIVE
Nitrite: NEGATIVE
Protein, ur: NEGATIVE mg/dL
Specific Gravity, Urine: 1.003 — ABNORMAL LOW (ref 1.005–1.030)
pH: 6 (ref 5.0–8.0)

## 2016-11-16 LAB — CBC WITH DIFFERENTIAL/PLATELET
Basophils Absolute: 0.1 10*3/uL (ref 0.0–0.1)
Basophils Relative: 1 %
Eosinophils Absolute: 0 10*3/uL (ref 0.0–0.7)
Eosinophils Relative: 0 %
HCT: 35 % — ABNORMAL LOW (ref 36.0–46.0)
Hemoglobin: 11.1 g/dL — ABNORMAL LOW (ref 12.0–15.0)
Lymphocytes Relative: 15 %
Lymphs Abs: 1.6 10*3/uL (ref 0.7–4.0)
MCH: 29.3 pg (ref 26.0–34.0)
MCHC: 31.7 g/dL (ref 30.0–36.0)
MCV: 92.3 fL (ref 78.0–100.0)
Monocytes Absolute: 0.8 10*3/uL (ref 0.1–1.0)
Monocytes Relative: 7 %
Neutro Abs: 8.2 10*3/uL — ABNORMAL HIGH (ref 1.7–7.7)
Neutrophils Relative %: 77 %
Platelets: 193 10*3/uL (ref 150–400)
RBC: 3.79 MIL/uL — ABNORMAL LOW (ref 3.87–5.11)
RDW: 13.9 % (ref 11.5–15.5)
WBC: 10.7 10*3/uL — ABNORMAL HIGH (ref 4.0–10.5)

## 2016-11-16 MED ORDER — HEPARIN SOD (PORK) LOCK FLUSH 100 UNIT/ML IV SOLN
500.0000 [IU] | Freq: Once | INTRAVENOUS | Status: AC | PRN
  Administered 2016-11-16: 500 [IU]
  Filled 2016-11-16: qty 5

## 2016-11-16 MED ORDER — POTASSIUM CHLORIDE CRYS ER 20 MEQ PO TBCR
60.0000 meq | EXTENDED_RELEASE_TABLET | Freq: Once | ORAL | Status: AC
  Administered 2016-11-16: 60 meq via ORAL

## 2016-11-16 MED ORDER — POTASSIUM CHLORIDE CRYS ER 20 MEQ PO TBCR
EXTENDED_RELEASE_TABLET | ORAL | Status: AC
  Filled 2016-11-16: qty 3

## 2016-11-16 MED ORDER — SODIUM CHLORIDE 0.9 % IV SOLN
Freq: Once | INTRAVENOUS | Status: AC
  Administered 2016-11-16: 12:00:00 via INTRAVENOUS
  Filled 2016-11-16: qty 5

## 2016-11-16 MED ORDER — SODIUM CHLORIDE 0.9 % IV SOLN
Freq: Once | INTRAVENOUS | Status: AC
  Administered 2016-11-16: 12:00:00 via INTRAVENOUS

## 2016-11-16 MED ORDER — SODIUM CHLORIDE 0.9% FLUSH: 10.0000 mL | INTRAVENOUS | Status: DC | PRN

## 2016-11-16 MED ORDER — DOXORUBICIN HCL CHEMO IV INJECTION 2 MG/ML
60.0000 mg/m2 | Freq: Once | INTRAVENOUS | Status: AC
  Administered 2016-11-16: 106 mg via INTRAVENOUS
  Filled 2016-11-16: qty 53

## 2016-11-16 MED ORDER — SODIUM CHLORIDE 0.9 % IV SOLN
600.0000 mg/m2 | Freq: Once | INTRAVENOUS | Status: AC
  Administered 2016-11-16: 1060 mg via INTRAVENOUS
  Filled 2016-11-16: qty 3

## 2016-11-16 MED ORDER — PALONOSETRON HCL INJECTION 0.25 MG/5ML
0.2500 mg | Freq: Once | INTRAVENOUS | Status: AC
  Administered 2016-11-16: 0.25 mg via INTRAVENOUS
  Filled 2016-11-16: qty 5

## 2016-11-16 MED ORDER — PEGFILGRASTIM 6 MG/0.6ML ~~LOC~~ PSKT
6.0000 mg | PREFILLED_SYRINGE | Freq: Once | SUBCUTANEOUS | Status: AC
  Administered 2016-11-16: 6 mg via SUBCUTANEOUS
  Filled 2016-11-16: qty 0.6

## 2016-11-16 NOTE — Progress Notes (Signed)
Tolerated tx w/o adverse reaction.  Alert, in no distress.  VSS.  Discharged ambulatory. 

## 2016-11-16 NOTE — Patient Instructions (Signed)
[facility] Discharge Instructions for Patients Receiving Chemotherapy   Beginning January 23rd 2017 lab work for the Cancer Center will be done in the  Main lab at [facility] on 1st floor. If you have a lab appointment with the Cancer Center please come in thru the  Main Entrance and check in at the main information desk   Today you received the following chemotherapy agents:  Adriamycin and Cytoxan.  To help prevent nausea and vomiting after your treatment, we encourage you to take your nausea medication as prescribed.    If you develop nausea and vomiting, or diarrhea that is not controlled by your medication, call the clinic.  The clinic phone number is [phone]. Office hours are Monday-Friday 8:30am-5:00pm.  BELOW ARE SYMPTOMS THAT SHOULD BE REPORTED IMMEDIATELY:  *FEVER GREATER THAN 101.0 F  *CHILLS WITH OR WITHOUT FEVER  NAUSEA AND VOMITING THAT IS NOT CONTROLLED WITH YOUR NAUSEA MEDICATION  *UNUSUAL SHORTNESS OF BREATH  *UNUSUAL BRUISING OR BLEEDING  TENDERNESS IN MOUTH AND THROAT WITH OR WITHOUT PRESENCE OF ULCERS  *URINARY PROBLEMS  *BOWEL PROBLEMS  UNUSUAL RASH Items with * indicate a potential emergency and should be followed up as soon as possible. If you have an emergency after office hours please contact your primary care physician or go to the nearest emergency department.  Please call the clinic during office hours if you have any questions or concerns.   You may also contact the Patient Navigator at [phone] should you have any questions or need assistance in obtaining follow up care.      Resources For Cancer Patients and their Caregivers ? American Cancer Society: Can assist with transportation, wigs, general needs, runs Look Good Feel Better.        1-888-227-6333 ? Cancer Care: Provides financial assistance, online support groups, medication/co-pay assistance.  1-800-813-HOPE (4673) ? [facility] Assists [place] cancer patients and their families through emotional , educational and financial support.  336-427-4357 ? [place] DSS Where to apply for food stamps, Medicaid and utility assistance. 336-342-1394 ? RCATS: Transportation to medical appointments. 336-347-2287 ? Social Security Administration: May apply for disability if have a Stage IV cancer. 336-342-7796 1-800-772-1213 ? [place] Aging, Disability and Transit Services: Assists with nutrition, care and transit needs. 336-349-2343         

## 2016-11-16 NOTE — Progress Notes (Signed)
[facility], MD [place] [address]  Infiltrating ductal carcinoma of right breast ([place])  Hypokalemia - Plan: potassium chloride SA (K-DUR,KLOR-CON) CR tablet 60 mEq  Bandemia - Plan: Urinalysis, Routine w reflex microscopic  CURRENT THERAPY: AC beginning on 11/02/2016  INTERVAL HISTORY: Kimberly Roman 60 y.o. female returns for followup of Stage IA (T1cN0M0) invasive ductal carcinoma of right breast, upper outer quadrant, HER2 NEGATIVE, ER/PR NEGATIVE.    Invasive ductal carcinoma of breast ([place])   08/13/2016 Mammogram    BI-RADS CATEGORY  0: Incomplete.      08/21/2016 Mammogram    BI-RADS CATEGORY  5: Highly suggestive of malignancy. Suspicious 1.2 cm mass 10 o'clock position right breast.      08/21/2016 Breast US    Targeted ultrasound is performed, showing a hypoechoic irregular mass with indistinct margins at 10 o'clock position 9 cm from the nipple measuring approximately 1.1 x 1.2 x 1.0 cm. No additional masses are seen in this region of the right breast on ultrasound.      08/28/2016 Procedure    Right needle core biopsy      09/26/2016 Procedure    Right upper outer quadrant lumpectomy by Dr. Arnoldo Morale      09/28/2016 Pathology Results    Invasive ductal carcinoma, grade 3, standing 1.7 cm. High-grade ductal carcinoma in situ with comedonecrosis. Invasive carcinoma in less than 0.1 cm of the superior medial margin focally. In situ carcinoma is less than 0.1 cm of superior medial margin and the posterior margin focally. No LVI. ER negative. PR negative. HER-2 negative. KI-67 90%.      09/28/2016 Cancer Staging    Invasive ductal carcinoma of breast [facility])   Staging form: Breast, AJCC 7th Edition   - Clinical stage from 09/28/2016: Stage IA (T1c, N0, M0) - Signed by Baird Cancer, PA-C on 10/22/2016      09/29/2016 Pathology Results    Invasive ductal carcinoma, grade 2/3. HER-2 negative. ER negative. PR negative. Ki-67  90%.      10/24/2016 Echocardiogram    - Left ventricle: The cavity size was normal. Wall thickness was   normal. Systolic function was normal. The estimated ejection   fraction was in the range of 60% to 65%. Wall motion was normal;   there were no regional wall motion abnormalities.       10/26/2016 Procedure    Port placed placed by Dr. Arnoldo Morale      11/02/2016 -  Chemotherapy    The patient had DOXOrubicin (ADRIAMYCIN) chemo injection 106 mg, 60 mg/m2 = 106 mg, Intravenous,  Once, 1 of 4 cycles  palonosetron (ALOXI) injection 0.25 mg, 0.25 mg, Intravenous,  Once, 1 of 4 cycles  pegfilgrastim (NEULASTA ONPRO KIT) injection 6 mg, 6 mg, Subcutaneous, Once, 1 of 4 cycles  cyclophosphamide (CYTOXAN) 1,060 mg in sodium chloride 0.9 % 250 mL chemo infusion, 600 mg/m2 = 1,060 mg, Intravenous,  Once, 1 of 4 cycles  fosaprepitant (EMEND) 150 mg, dexamethasone (DECADRON) 12 mg in sodium chloride 0.9 % 145 mL IVPB, , Intravenous,  Once, 1 of 4 cycles  for chemotherapy treatment.         She is tolerating treatment well.  She notes hair loss is noticeable.  She is seeing clumps of hair while brushing.  She reports 1 episode of nausea that resolved spontaneously.  She denies any vomiting.  She notes a metallic taste to foods recently, in particular to chicken.  She notes some fatigue that requires her to rest in the afternoon.   She denies a cough, urinary complaints, sputum production, URI symptoms.  Review of Systems  Constitutional: Negative.  Negative for chills, fever and weight loss.  HENT: Negative.  Negative for congestion, sinus pain and sore throat.   Eyes: Negative.   Respiratory: Negative.  Negative for cough and sputum production.   Cardiovascular: Negative.  Negative for chest pain.  Gastrointestinal: Positive for nausea (1 episode). Negative for constipation, diarrhea and vomiting.  Genitourinary: Negative.  Negative for dysuria.  Musculoskeletal: Negative.   Skin:  Negative.  Negative for rash.  Neurological: Negative.  Negative for weakness.  Endo/Heme/Allergies: Negative.   Psychiatric/Behavioral: Negative.     Past Medical History:  Diagnosis Date  . Ankylosing spondylitis ([place])   . Arthritis   . Breast cancer ([place])   . Carpal tunnel syndrome   . Environmental allergies   . GERD (gastroesophageal reflux disease)   . Hyperlipemia   . Hypertension   . Invasive ductal carcinoma of breast ([place]) 10/22/2016    Past Surgical History:  Procedure Laterality Date  . ABDOMINAL HYSTERECTOMY    . CARPAL TUNNEL RELEASE Right 04/27/2013   Procedure: RIGHT CARPAL TUNNEL RELEASE;  Surgeon: Carole Civil, MD;  Location: [facility] ORS;  Service: Orthopedics;  Laterality: Right;  . COLONOSCOPY N/A 01/20/2013   Procedure: COLONOSCOPY;  Surgeon: Danie Binder, MD;  Location: [facility] ENDO SUITE;  Service: Endoscopy;  Laterality: N/A;  9:30 AM  . HEMATOMA EVACUATION Right 10/26/2016   Procedure: EVACUATION HEMATOMA;  Surgeon: Aviva Signs, MD;  Location: [facility] ORS;  Service: General;  Laterality: Right;  . PARTIAL MASTECTOMY WITH AXILLARY SENTINEL LYMPH NODE BIOPSY Right 09/26/2016   Procedure: PARTIAL MASTECTOMY WITH AXILLARY SENTINEL LYMPH NODE BIOPSY;  Surgeon: Aviva Signs, MD;  Location: [facility] ORS;  Service: General;  Laterality: Right;  . PORTACATH PLACEMENT Left 10/26/2016   Procedure: INSERTION PORT-A-CATH;  Surgeon: Aviva Signs, MD;  Location: [facility] ORS;  Service: General;  Laterality: Left;    Family History  Problem Relation Age of Onset  . Arthritis    . Hypertension Mother   . Hypertension Father   . Colon cancer Neg Hx     Social History   Social History  . Marital status: Single    Spouse name: N/A  . Number of children: N/A  . Years of education: N/A   Social History Main Topics  . Smoking status: Never Smoker  . Smokeless tobacco: Never Used  . Alcohol use No  . Drug use: No  . Sexual activity: Not on file   Other Topics Concern  . Not on file    Social History Narrative  . No narrative on file     PHYSICAL EXAMINATION  ECOG PERFORMANCE STATUS: 1 - Symptomatic but completely ambulatory  There were no vitals filed for this visit.  Vitals - 1 value per visit 38/75/6433  SYSTOLIC 295  DIASTOLIC 72  Pulse 82  Temperature 98.2  Respirations 18  Weight (lb) 148    GENERAL:alert, no distress, well nourished, well developed, comfortable, cooperative, smiling and in chemo-recliner, accompanied by her mother, late for her appointments today. SKIN: skin color, texture, turgor are normal, no rashes or significant lesions HEAD: Normocephalic, No masses, lesions, tenderness or abnormalities EYES: normal, EOMI, Conjunctiva are pink and non-injected EARS: External ears normal OROPHARYNX:lips, buccal mucosa, and tongue normal and mucous membranes are moist  NECK: supple, trachea midline LYMPH:  not examined BREAST:not examined  LUNGS: clear to auscultation  HEART: regular rate & rhythm ABDOMEN:abdomen soft and normal bowel sounds BACK: Back symmetric, no curvature. EXTREMITIES:less then 2 second capillary refill, no joint deformities, effusion, or inflammation, no skin discoloration, no cyanosis  NEURO: alert & oriented x 3 with fluent speech, no focal motor/sensory deficits, gait normal   LABORATORY DATA: CBC    Component Value Date/Time   WBC 10.7 (H) 11/16/2016 1021   RBC 3.79 (L) 11/16/2016 1021   HGB 11.1 (L) 11/16/2016 1021   HCT 35.0 (L) 11/16/2016 1021   PLT 193 11/16/2016 1021   MCV 92.3 11/16/2016 1021   MCH 29.3 11/16/2016 1021   MCHC 31.7 11/16/2016 1021   RDW 13.9 11/16/2016 1021   LYMPHSABS 1.6 11/16/2016 1021   MONOABS 0.8 11/16/2016 1021   EOSABS 0.0 11/16/2016 1021   BASOSABS 0.1 11/16/2016 1021      Chemistry      Component Value Date/Time   NA 141 11/16/2016 1021   K 3.2 (L) 11/16/2016 1021   CL 106 11/16/2016 1021   CO2 29 11/16/2016 1021   BUN 6 11/16/2016 1021   CREATININE 0.55 11/16/2016  1021      Component Value Date/Time   CALCIUM 9.3 11/16/2016 1021   ALKPHOS 86 11/16/2016 1021   AST 16 11/16/2016 1021   ALT 18 11/16/2016 1021   BILITOT 0.4 11/16/2016 1021        PENDING LABS:   RADIOGRAPHIC STUDIES:  Dg Chest Port 1 View  Result Date: 10/26/2016 CLINICAL DATA:  Left-sided Port-A-Cath placement EXAM: PORTABLE CHEST 1 VIEW COMPARISON:  September 24, 2016 FINDINGS: Port-A-Cath tip is in the superior vena cava. No pneumothorax. There is mild atelectatic change in the right mid lung. Lungs elsewhere clear. Heart size and pulmonary vascularity are normal. No adenopathy. No bone lesions. IMPRESSION: Port-A-Cath tip in superior vena cava. No pneumothorax. Slight right midlung atelectasis. Lungs elsewhere clear. Stable cardiac silhouette. Electronically Signed   By: Lowella Grip III M.D.   On: 10/26/2016 11:37     PATHOLOGY:    ASSESSMENT AND PLAN:  Invasive ductal carcinoma of breast (HCC) Stage IA (T1cN0M0) invasive ductal carcinoma of right breast, upper outer quadrant, HER2 NEGATIVE, ER/PR NEGATIVE.  Currently on [facility] systemic chemotherapy with Neulasta support.  Oncology history is updated.  Pre-treatment labs today: CBC diff, CMET.  I personally reviewed and went over laboratory results with the patient.  The results are noted within this dictation. Minimal leukocytosis is noted, likely secondary to Neulasta support.  Hypokalemia is also noted.  UA with reflex ordered today due to leukocytosis.  She is asymptomatic.  Hypokalemia is noted today.  She is taking her Kdur at home.  Order placed for 60 mEq Kdur today in the clinic.  Return in 2 weeks for follow-up and cycle #3 of treatment.   ORDERS PLACED FOR THIS ENCOUNTER: Orders Placed This Encounter  Procedures  . Urinalysis, Routine w reflex microscopic    MEDICATIONS PRESCRIBED THIS ENCOUNTER: No orders of the defined types were placed in this encounter.   THERAPY PLAN:  Continue 4 cycles of  AC and this will be followed by T.  All questions were answered. The patient knows to call the clinic with any problems, questions or concerns. We can certainly see the patient much sooner if necessary.  Patient and plan discussed with Dr. Ancil Linsey and she is in agreement with the aforementioned.   This note is electronically signed by: Doy Mince 11/16/2016 11:40 AM

## 2016-11-16 NOTE — Patient Instructions (Signed)
[place] at [facility] Discharge Instructions  RECOMMENDATIONS MADE BY THE CONSULTANT AND ANY TEST RESULTS WILL BE SENT TO YOUR REFERRING PHYSICIAN.  You saw [name], PA-C, today. Treatment today. Urine check today. Potassium today. Continue taking potassium at home. Treatment and follow up in 2 weeks with Tom. Treatment and follow up in 4 weeks with Dr. Whitney Muse See Amy at checkout for appointments.  Thank you for choosing [place] at [facility] to provide your oncology and hematology care.  To afford each patient quality time with our provider, please arrive at least 15 minutes before your scheduled appointment time.   Beginning January 23rd 2017 lab work for the [facility] will be done in the  Main lab at [facility] on 1st floor. If you have a lab appointment with the [facility] please come in thru the  Main Entrance and check in at the main information desk  You need to re-schedule your appointment should you arrive 10 or more minutes late.  We strive to give you quality time with our providers, and arriving late affects you and other patients whose appointments are after yours.  Also, if you no show three or more times for appointments you may be dismissed from the clinic at the providers discretion.     Again, thank you for choosing [facility].  Our hope is that these requests will decrease the amount of time that you wait before being seen by our physicians.       _____________________________________________________________  Should you have questions after your visit to [facility], please contact our office at [phone] between the hours of 8:30 a.m. and 4:30 p.m.  Voicemails left after 4:30 p.m. will not be returned until the following business day.  For prescription refill requests, have your pharmacy contact our office.         Resources For Cancer Patients and their  Caregivers ? American Cancer Society: Can assist with transportation, wigs, general needs, runs Look Good Feel Better.        [phone] ? Cancer Care: Provides financial assistance, online support groups, medication/co-pay assistance.  1-800-813-HOPE 518-691-1920) ? [employer] Assists [place] cancer patients and their families through emotional , educational and financial support.  ([phone] 803-9857 ? [place] DSS Where to apply for food stamps, Medicaid and utility assistance. 517-668-5070 ? [employer]: Transportation to medical appointments. 276-187-6750 ? Social Security Administration: May apply for disability if have a Stage IV cancer. ([phone] 214-634-8211 ? [employer], Disability and Transit Services: Assists with nutrition, care and transit needs. [place] Support Programs: @10RELATIVEDAYS @ > Cancer Support Group  2nd Tuesday of the month 1pm-2pm, Journey Room  > Creative Journey  3rd Tuesday of the month 1130am-1pm, Journey Room  > Look Good Feel Better  1st Wednesday of the month 10am-12 noon, Journey Room (Call [place] to register [phone])

## 2016-11-16 NOTE — Assessment & Plan Note (Addendum)
Stage IA (T1cN0M0) invasive ductal carcinoma of right breast, upper outer quadrant, HER2 NEGATIVE, ER/PR NEGATIVE.  Currently on [facility] systemic chemotherapy with Neulasta support.  Oncology history is updated.  Pre-treatment labs today: CBC diff, CMET.  I personally reviewed and went over laboratory results with the patient.  The results are noted within this dictation. Minimal leukocytosis is noted, likely secondary to Neulasta support.  Hypokalemia is also noted.  UA with reflex ordered today due to leukocytosis.  She is asymptomatic.  Hypokalemia is noted today.  She is taking her Kdur at home.  Order placed for 60 mEq Kdur today in the clinic.  Return in 2 weeks for follow-up and cycle #3 of treatment.

## 2016-11-17 ENCOUNTER — Encounter (HOSPITAL_COMMUNITY): Payer: Self-pay | Admitting: Hematology & Oncology

## 2016-11-17 DIAGNOSIS — M459 Ankylosing spondylitis of unspecified sites in spine: Secondary | ICD-10-CM | POA: Insufficient documentation

## 2016-11-17 DIAGNOSIS — K219 Gastro-esophageal reflux disease without esophagitis: Secondary | ICD-10-CM | POA: Insufficient documentation

## 2016-11-23 ENCOUNTER — Encounter (HOSPITAL_COMMUNITY): Payer: Self-pay | Admitting: Hematology & Oncology

## 2016-11-30 ENCOUNTER — Encounter (HOSPITAL_BASED_OUTPATIENT_CLINIC_OR_DEPARTMENT_OTHER): Payer: BLUE CROSS/BLUE SHIELD | Admitting: Oncology

## 2016-11-30 ENCOUNTER — Encounter (HOSPITAL_COMMUNITY): Payer: BLUE CROSS/BLUE SHIELD | Attending: Hematology & Oncology

## 2016-11-30 VITALS — BP 126/68 | HR 74 | Temp 98.1°F | Resp 18

## 2016-11-30 DIAGNOSIS — E876 Hypokalemia: Secondary | ICD-10-CM | POA: Diagnosis present

## 2016-11-30 DIAGNOSIS — C50911 Malignant neoplasm of unspecified site of right female breast: Secondary | ICD-10-CM

## 2016-11-30 DIAGNOSIS — C50411 Malignant neoplasm of upper-outer quadrant of right female breast: Secondary | ICD-10-CM | POA: Diagnosis not present

## 2016-11-30 DIAGNOSIS — Z5111 Encounter for antineoplastic chemotherapy: Secondary | ICD-10-CM | POA: Diagnosis not present

## 2016-11-30 DIAGNOSIS — Z171 Estrogen receptor negative status [ER-]: Secondary | ICD-10-CM

## 2016-11-30 DIAGNOSIS — D72825 Bandemia: Secondary | ICD-10-CM | POA: Insufficient documentation

## 2016-11-30 DIAGNOSIS — Z5189 Encounter for other specified aftercare: Secondary | ICD-10-CM | POA: Diagnosis not present

## 2016-11-30 LAB — COMPREHENSIVE METABOLIC PANEL
ALT: 14 U/L (ref 14–54)
AST: 17 U/L (ref 15–41)
Albumin: 3.8 g/dL (ref 3.5–5.0)
Alkaline Phosphatase: 91 U/L (ref 38–126)
Anion gap: 7 (ref 5–15)
BUN: 6 mg/dL (ref 6–20)
CO2: 30 mmol/L (ref 22–32)
Calcium: 9.2 mg/dL (ref 8.9–10.3)
Chloride: 105 mmol/L (ref 101–111)
Creatinine, Ser: 0.56 mg/dL (ref 0.44–1.00)
GFR calc Af Amer: >60 mL/min (ref >60)
GFR calc non Af Amer: >60 mL/min (ref >60)
Glucose, Bld: 104 mg/dL — ABNORMAL HIGH (ref 65–99)
Potassium: 3.3 mmol/L — ABNORMAL LOW (ref 3.5–5.1)
Sodium: 142 mmol/L (ref 135–145)
Total Bilirubin: 0.5 mg/dL (ref 0.3–1.2)
Total Protein: 7 g/dL (ref 6.5–8.1)

## 2016-11-30 LAB — CBC WITH DIFFERENTIAL/PLATELET
Basophils Absolute: 0.1 10*3/uL (ref 0.0–0.1)
Basophils Relative: 1 %
Eosinophils Absolute: 0 10*3/uL (ref 0.0–0.7)
Eosinophils Relative: 0 %
HCT: 33 % — ABNORMAL LOW (ref 36.0–46.0)
Hemoglobin: 10.7 g/dL — ABNORMAL LOW (ref 12.0–15.0)
Lymphocytes Relative: 9 %
Lymphs Abs: 1.3 10*3/uL (ref 0.7–4.0)
MCH: 29.8 pg (ref 26.0–34.0)
MCHC: 32.4 g/dL (ref 30.0–36.0)
MCV: 91.9 fL (ref 78.0–100.0)
Monocytes Absolute: 1.5 10*3/uL — ABNORMAL HIGH (ref 0.1–1.0)
Monocytes Relative: 10 %
Neutro Abs: 11.6 10*3/uL — ABNORMAL HIGH (ref 1.7–7.7)
Neutrophils Relative %: 80 %
Platelets: 257 10*3/uL (ref 150–400)
RBC: 3.59 MIL/uL — ABNORMAL LOW (ref 3.87–5.11)
RDW: 14.3 % (ref 11.5–15.5)
WBC: 14.5 10*3/uL — ABNORMAL HIGH (ref 4.0–10.5)

## 2016-11-30 MED ORDER — PALONOSETRON HCL INJECTION 0.25 MG/5ML
0.2500 mg | Freq: Once | INTRAVENOUS | Status: AC
  Administered 2016-11-30: 0.25 mg via INTRAVENOUS

## 2016-11-30 MED ORDER — PEGFILGRASTIM 6 MG/0.6ML ~~LOC~~ PSKT
6.0000 mg | PREFILLED_SYRINGE | Freq: Once | SUBCUTANEOUS | Status: AC
  Administered 2016-11-30: 6 mg via SUBCUTANEOUS
  Filled 2016-11-30: qty 0.6

## 2016-11-30 MED ORDER — HEPARIN SOD (PORK) LOCK FLUSH 100 UNIT/ML IV SOLN
500.0000 [IU] | Freq: Once | INTRAVENOUS | Status: AC | PRN
  Administered 2016-11-30: 500 [IU]
  Filled 2016-11-30: qty 5

## 2016-11-30 MED ORDER — SODIUM CHLORIDE 0.9% FLUSH
10.0000 mL | INTRAVENOUS | Status: DC | PRN
  Administered 2016-11-30: 10 mL
  Filled 2016-11-30: qty 10

## 2016-11-30 MED ORDER — SODIUM CHLORIDE 0.9 % IV SOLN
600.0000 mg/m2 | Freq: Once | INTRAVENOUS | Status: AC
  Administered 2016-11-30: 1060 mg via INTRAVENOUS
  Filled 2016-11-30: qty 50

## 2016-11-30 MED ORDER — SODIUM CHLORIDE 0.9 % IV SOLN
Freq: Once | INTRAVENOUS | Status: AC
  Administered 2016-11-30: 11:00:00 via INTRAVENOUS

## 2016-11-30 MED ORDER — DOXORUBICIN HCL CHEMO IV INJECTION 2 MG/ML
60.0000 mg/m2 | Freq: Once | INTRAVENOUS | Status: AC
  Administered 2016-11-30: 106 mg via INTRAVENOUS
  Filled 2016-11-30: qty 53

## 2016-11-30 MED ORDER — PALONOSETRON HCL INJECTION 0.25 MG/5ML
INTRAVENOUS | Status: AC
  Filled 2016-11-30: qty 5

## 2016-11-30 MED ORDER — SODIUM CHLORIDE 0.9 % IV SOLN
Freq: Once | INTRAVENOUS | Status: AC
  Administered 2016-11-30: 11:00:00 via INTRAVENOUS
  Filled 2016-11-30: qty 5

## 2016-11-30 NOTE — Patient Instructions (Addendum)
[facility] Discharge Instructions for Patients Receiving Chemotherapy   Beginning January 23rd 2017 lab work for the Cancer Center will be done in the  Main lab at [facility] on 1st floor. If you have a lab appointment with the Cancer Center please come in thru the  Main Entrance and check in at the main information desk   Today you received the following chemotherapy agents Adriamycin and Cytoxan as well as Neulasta on-pro. Follow-up as scheduled. Call clinic for any questions or concerns  To help prevent nausea and vomiting after your treatment, we encourage you to take your nausea medication   If you develop nausea and vomiting, or diarrhea that is not controlled by your medication, call the clinic.  The clinic phone number is [phone]. Office hours are Monday-Friday 8:30am-5:00pm.  BELOW ARE SYMPTOMS THAT SHOULD BE REPORTED IMMEDIATELY:  *FEVER GREATER THAN 101.0 F  *CHILLS WITH OR WITHOUT FEVER  NAUSEA AND VOMITING THAT IS NOT CONTROLLED WITH YOUR NAUSEA MEDICATION  *UNUSUAL SHORTNESS OF BREATH  *UNUSUAL BRUISING OR BLEEDING  TENDERNESS IN MOUTH AND THROAT WITH OR WITHOUT PRESENCE OF ULCERS  *URINARY PROBLEMS  *BOWEL PROBLEMS  UNUSUAL RASH Items with * indicate a potential emergency and should be followed up as soon as possible. If you have an emergency after office hours please contact your primary care physician or go to the nearest emergency department.  Please call the clinic during office hours if you have any questions or concerns.   You may also contact the Patient Navigator at [phone] should you have any questions or need assistance in obtaining follow up care.      Resources For Cancer Patients and their Caregivers ? American Cancer Society: Can assist with transportation, wigs, general needs, runs Look Good Feel Better.        1-888-227-6333 ? Cancer Care: Provides financial assistance, online support groups,  medication/co-pay assistance.  1-800-813-HOPE (4673) ? [facility] Assists [place] cancer patients and their families through emotional , educational and financial support.  336-427-4357 ? [place] DSS Where to apply for food stamps, Medicaid and utility assistance. 336-342-1394 ? RCATS: Transportation to medical appointments. 336-347-2287 ? Social Security Administration: May apply for disability if have a Stage IV cancer. 336-342-7796 1-800-772-1213 ? [place] [employer], Disability and Transit Services: Assists with nutrition, care and transit needs. [phone]         

## 2016-11-30 NOTE — Assessment & Plan Note (Addendum)
Stage IA (T1cN0M0) invasive ductal carcinoma of right breast, upper outer quadrant, HER2 NEGATIVE, ER/PR NEGATIVE.  Currently on [facility] systemic chemotherapy with Neulasta support.  Oncology history is updated.  Pre-treatment labs today: CBC diff, CMET.  I personally reviewed and went over laboratory results with the patient.  The results are noted within this dictation.   She is having some mild left low back, paraspinal, muscle spasms.  She notes it comes and goes.    She is having small hyperpigmented lesions on her palms of hands, mostly on the right.  Non-tender and without erythema.  Return in 2 weeks for follow-up and cycle #4 of treatment.

## 2016-11-30 NOTE — Patient Instructions (Addendum)
[place] at [facility] Discharge Instructions  RECOMMENDATIONS MADE BY THE CONSULTANT AND ANY TEST RESULTS WILL BE SENT TO YOUR REFERRING PHYSICIAN.  Pre treatment labs done today  Treatment today as long as labs are good  Return 2 weeks follow up and treatment   Thank you for choosing [place] at [facility] to provide your oncology and hematology care.  To afford each patient quality time with our provider, please arrive at least 15 minutes before your scheduled appointment time.    If you have a lab appointment with the [facility] please come in thru the  Main Entrance and check in at the main information desk  You need to re-schedule your appointment should you arrive 10 or more minutes late.  We strive to give you quality time with our providers, and arriving late affects you and other patients whose appointments are after yours.  Also, if you no show three or more times for appointments you may be dismissed from the clinic at the providers discretion.     Again, thank you for choosing [facility].  Our hope is that these requests will decrease the amount of time that you wait before being seen by our physicians.       _____________________________________________________________  Should you have questions after your visit to [facility], please contact our office at [phone] between the hours of 8:30 a.m. and 4:30 p.m.  Voicemails left after 4:30 p.m. will not be returned until the following business day.  For prescription refill requests, have your pharmacy contact our office.       Resources For Cancer Patients and their Caregivers ? American Cancer Society: Can assist with transportation, wigs, general needs, runs Look Good Feel Better.        617-603-5318 ? Cancer Care: Provides financial assistance, online support groups, medication/co-pay assistance.  1-800-813-HOPE 2171859919) ? [facility] Assists [place] cancer patients and their families through emotional , educational and financial support.  339-110-1779 ? [place] DSS Where to apply for food stamps, Medicaid and utility assistance. 463 604 6639 ? [employer]: Transportation to medical appointments. 603-831-6094 ? Social Security Administration: May apply for disability if have a Stage IV cancer. 531-112-9492 773-731-4252 ? [employer], Disability and Transit Services: Assists with nutrition, care and transit needs. [place] Support Programs: @10RELATIVEDAYS @ > Cancer Support Group  2nd Tuesday of the month 1pm-2pm, Journey Room  > Creative Journey  3rd Tuesday of the month 1130am-1pm, Journey Room  > Look Good Feel Better  1st Wednesday of the month 10am-12 noon, Journey Room (Call [place] to register [phone])

## 2016-11-30 NOTE — Progress Notes (Signed)
[facility], MD [place] [address]  Infiltrating ductal carcinoma of right breast [facility])  CURRENT THERAPY: AC beginning on 11/02/2016  INTERVAL HISTORY: Kimberly Roman 61 y.o. female returns for followup of Stage IA (T1cN0M0) invasive ductal carcinoma of right breast, upper outer quadrant, HER2 NEGATIVE, ER/PR NEGATIVE.    Invasive ductal carcinoma of breast ([place])   08/13/2016 Mammogram    BI-RADS CATEGORY  0: Incomplete.      08/21/2016 Mammogram    BI-RADS CATEGORY  5: Highly suggestive of malignancy. Suspicious 1.2 cm mass 10 o'clock position right breast.      08/21/2016 Breast US    Targeted ultrasound is performed, showing a hypoechoic irregular mass with indistinct margins at 10 o'clock position 9 cm from the nipple measuring approximately 1.1 x 1.2 x 1.0 cm. No additional masses are seen in this region of the right breast on ultrasound.      08/28/2016 Procedure    Right needle core biopsy      09/26/2016 Procedure    Right upper outer quadrant lumpectomy by Dr. Arnoldo Morale      09/28/2016 Pathology Results    Invasive ductal carcinoma, grade 3, standing 1.7 cm. High-grade ductal carcinoma in situ with comedonecrosis. Invasive carcinoma in less than 0.1 cm of the superior medial margin focally. In situ carcinoma is less than 0.1 cm of superior medial margin and the posterior margin focally. No LVI. ER negative. PR negative. HER-2 negative. KI-67 90%.      09/28/2016 Cancer Staging    Invasive ductal carcinoma of breast [facility])   Staging form: Breast, AJCC 7th Edition   - Clinical stage from 09/28/2016: Stage IA (T1c, N0, M0) - Signed by Baird Cancer, PA-C on 10/22/2016      09/29/2016 Pathology Results    Invasive ductal carcinoma, grade 2/3. HER-2 negative. ER negative. PR negative. Ki-67 90%.      10/24/2016 Echocardiogram    - Left ventricle: The cavity size was normal. Wall thickness was   normal. Systolic function was  normal. The estimated ejection   fraction was in the range of 60% to 65%. Wall motion was normal;   there were no regional wall motion abnormalities.       10/26/2016 Procedure    Port placed placed by Dr. Arnoldo Morale      11/02/2016 -  Chemotherapy    The patient had DOXOrubicin (ADRIAMYCIN) chemo injection 106 mg, 60 mg/m2 = 106 mg, Intravenous,  Once, 2 of 4 cycles  palonosetron (ALOXI) injection 0.25 mg, 0.25 mg, Intravenous,  Once, 2 of 4 cycles  pegfilgrastim (NEULASTA ONPRO KIT) injection 6 mg, 6 mg, Subcutaneous, Once, 2 of 4 cycles  cyclophosphamide (CYTOXAN) 1,060 mg in sodium chloride 0.9 % 250 mL chemo infusion, 600 mg/m2 = 1,060 mg, Intravenous,  Once, 2 of 4 cycles  fosaprepitant (EMEND) 150 mg, dexamethasone (DECADRON) 12 mg in sodium chloride 0.9 % 145 mL IVPB, , Intravenous,  Once, 2 of 4 cycles  for chemotherapy treatment.         She is tolerating therapy well.  She denies any nausea or vomiting.  She reports infrequent mouth sores that are resolved today.  She reports the her mouth sores resolve with salt water rinses.  She notes some hyperpigmented lesions on her hands.  They are not tender or red.  She notes that they are mostly on her right hand.  She notes some scalp tenderness with chemotherapy-induced alopecia.  She reports some left sided low back pain that is paraspinal.  She notes that it comes and goes.  It infrequently is painful.  Review of Systems  Constitutional: Negative.  Negative for chills, fever and weight loss.  HENT: Negative.  Negative for congestion, sinus pain and sore throat.   Eyes: Negative.   Respiratory: Negative.  Negative for cough and sputum production.   Cardiovascular: Negative.  Negative for chest pain.  Gastrointestinal: Negative for constipation, diarrhea, nausea and vomiting.  Genitourinary: Negative.  Negative for dysuria.  Musculoskeletal: Positive for back pain.  Skin: Negative.  Negative for rash.  Neurological: Negative.   Negative for weakness.  Endo/Heme/Allergies: Negative.   Psychiatric/Behavioral: Negative.     Past Medical History:  Diagnosis Date  . Ankylosing spondylitis ([place])   . Arthritis   . Breast cancer ([place])   . Carpal tunnel syndrome   . Environmental allergies   . GERD (gastroesophageal reflux disease)   . Hyperlipemia   . Hypertension   . Invasive ductal carcinoma of breast ([place]) 10/22/2016    Past Surgical History:  Procedure Laterality Date  . ABDOMINAL HYSTERECTOMY    . CARPAL TUNNEL RELEASE Right 04/27/2013   Procedure: RIGHT CARPAL TUNNEL RELEASE;  Surgeon: Carole Civil, MD;  Location: [facility] ORS;  Service: Orthopedics;  Laterality: Right;  . COLONOSCOPY N/A 01/20/2013   Procedure: COLONOSCOPY;  Surgeon: Danie Binder, MD;  Location: [facility] ENDO SUITE;  Service: Endoscopy;  Laterality: N/A;  9:30 AM  . HEMATOMA EVACUATION Right 10/26/2016   Procedure: EVACUATION HEMATOMA;  Surgeon: Aviva Signs, MD;  Location: [facility] ORS;  Service: General;  Laterality: Right;  . PARTIAL MASTECTOMY WITH AXILLARY SENTINEL LYMPH NODE BIOPSY Right 09/26/2016   Procedure: PARTIAL MASTECTOMY WITH AXILLARY SENTINEL LYMPH NODE BIOPSY;  Surgeon: Aviva Signs, MD;  Location: [facility] ORS;  Service: General;  Laterality: Right;  . PORTACATH PLACEMENT Left 10/26/2016   Procedure: INSERTION PORT-A-CATH;  Surgeon: Aviva Signs, MD;  Location: [facility] ORS;  Service: General;  Laterality: Left;    Family History  Problem Relation Age of Onset  . Arthritis    . Hypertension Mother   . Hypertension Father   . Colon cancer Neg Hx     Social History   Social History  . Marital status: Single    Spouse name: N/A  . Number of children: N/A  . Years of education: N/A   Social History Main Topics  . Smoking status: Never Smoker  . Smokeless tobacco: Never Used  . Alcohol use No  . Drug use: No  . Sexual activity: Not on file   Other Topics Concern  . Not on file   Social History Narrative  . No narrative on file      PHYSICAL EXAMINATION  ECOG PERFORMANCE STATUS: 1 - Symptomatic but completely ambulatory  Vitals:   11/30/16 0936  BP: (!) 142/65  Pulse: 80  Resp: 18  Temp: 97.9 F (36.6 C)     GENERAL:alert, no distress, well nourished, well developed, comfortable, cooperative, smiling and in chemo-recliner, accompanied by her mother, late for her appointments today. SKIN: skin color, texture, turgor are normal, no rashes. Positive: right palm hyperpigmented lesions, no erythema or tenderness, without any open areas.  Lesions are not raised.  They measure 5 mm or less. HEAD: Normocephalic, No masses, lesions, tenderness or abnormalities EYES: normal, EOMI, Conjunctiva are pink and non-injected EARS: External ears normal OROPHARYNX:lips, buccal mucosa, and tongue normal and mucous membranes are moist  NECK: supple, trachea  midline LYMPH:  not examined BREAST:not examined LUNGS: clear to auscultation  HEART: regular rate & rhythm ABDOMEN:abdomen soft and normal bowel sounds BACK: Back symmetric, no curvature.  Left low paraspinal tightness/hardness EXTREMITIES:less then 2 second capillary refill, no joint deformities, effusion, or inflammation, no skin discoloration, no cyanosis  NEURO: alert & oriented x 3 with fluent speech, no focal motor/sensory deficits, gait normal   LABORATORY DATA: CBC    Component Value Date/Time   WBC 10.7 (H) 11/16/2016 1021   RBC 3.79 (L) 11/16/2016 1021   HGB 11.1 (L) 11/16/2016 1021   HCT 35.0 (L) 11/16/2016 1021   PLT 193 11/16/2016 1021   MCV 92.3 11/16/2016 1021   MCH 29.3 11/16/2016 1021   MCHC 31.7 11/16/2016 1021   RDW 13.9 11/16/2016 1021   LYMPHSABS 1.6 11/16/2016 1021   MONOABS 0.8 11/16/2016 1021   EOSABS 0.0 11/16/2016 1021   BASOSABS 0.1 11/16/2016 1021      Chemistry      Component Value Date/Time   NA 141 11/16/2016 1021   K 3.2 (L) 11/16/2016 1021   CL 106 11/16/2016 1021   CO2 29 11/16/2016 1021   BUN 6 11/16/2016 1021    CREATININE 0.55 11/16/2016 1021      Component Value Date/Time   CALCIUM 9.3 11/16/2016 1021   ALKPHOS 86 11/16/2016 1021   AST 16 11/16/2016 1021   ALT 18 11/16/2016 1021   BILITOT 0.4 11/16/2016 1021        PENDING LABS:   RADIOGRAPHIC STUDIES:  No results found.   PATHOLOGY:    ASSESSMENT AND PLAN:  Invasive ductal carcinoma of breast ([place]) Stage IA (T1cN0M0) invasive ductal carcinoma of right breast, upper outer quadrant, HER2 NEGATIVE, ER/PR NEGATIVE.  Currently on [facility] systemic chemotherapy with Neulasta support.  Oncology history is updated.  Pre-treatment labs today: CBC diff, CMET.  I personally reviewed and went over laboratory results with the patient.  The results are noted within this dictation.   She is having some mild left low back, paraspinal, muscle spasms.  She notes it comes and goes.    She is having small hyperpigmented lesions on her palms of hands, mostly on the right.  Non-tender and without erythema.  Return in 2 weeks for follow-up and cycle #4 of treatment.   ORDERS PLACED FOR THIS ENCOUNTER: No orders of the defined types were placed in this encounter.   MEDICATIONS PRESCRIBED THIS ENCOUNTER: No orders of the defined types were placed in this encounter.   THERAPY PLAN:  Continue 4 cycles of AC and this will be followed by T.  All questions were answered. The patient knows to call the clinic with any problems, questions or concerns. We can certainly see the patient much sooner if necessary.  Patient and plan discussed with Dr. Ancil Linsey and she is in agreement with the aforementioned.   This note is electronically signed by: Doy Mince 11/30/2016 10:18 AM

## 2016-11-30 NOTE — Progress Notes (Signed)
Kimberly Roman tolerated chemo tx and Neulasta on-pro well without complaints or incident. Labs reviewed prior to administering chemotherapy. Neulasta-on-pro applied to pt's left arm with green indicator light flashing. VSS upon discharge. Pt discharged self ambulatory in satisfactory condition accompanied by hermother

## 2016-12-14 ENCOUNTER — Ambulatory Visit (HOSPITAL_COMMUNITY): Payer: BLUE CROSS/BLUE SHIELD | Admitting: Hematology & Oncology

## 2016-12-14 ENCOUNTER — Encounter (HOSPITAL_BASED_OUTPATIENT_CLINIC_OR_DEPARTMENT_OTHER): Payer: BLUE CROSS/BLUE SHIELD

## 2016-12-14 ENCOUNTER — Ambulatory Visit (HOSPITAL_COMMUNITY): Payer: BLUE CROSS/BLUE SHIELD

## 2016-12-14 ENCOUNTER — Encounter (HOSPITAL_COMMUNITY): Payer: Self-pay

## 2016-12-14 VITALS — BP 116/50 | HR 64 | Temp 98.2°F | Resp 16 | Wt 146.0 lb

## 2016-12-14 DIAGNOSIS — C50911 Malignant neoplasm of unspecified site of right female breast: Secondary | ICD-10-CM

## 2016-12-14 DIAGNOSIS — Z5189 Encounter for other specified aftercare: Secondary | ICD-10-CM

## 2016-12-14 DIAGNOSIS — C50411 Malignant neoplasm of upper-outer quadrant of right female breast: Secondary | ICD-10-CM | POA: Diagnosis not present

## 2016-12-14 DIAGNOSIS — Z5111 Encounter for antineoplastic chemotherapy: Secondary | ICD-10-CM

## 2016-12-14 LAB — CBC WITH DIFFERENTIAL/PLATELET
Basophils Absolute: 0.2 10*3/uL — ABNORMAL HIGH (ref 0.0–0.1)
Basophils Relative: 1 %
Eosinophils Absolute: 0 10*3/uL (ref 0.0–0.7)
Eosinophils Relative: 0 %
HCT: 32.3 % — ABNORMAL LOW (ref 36.0–46.0)
Hemoglobin: 10.6 g/dL — ABNORMAL LOW (ref 12.0–15.0)
Lymphocytes Relative: 7 %
Lymphs Abs: 1.4 10*3/uL (ref 0.7–4.0)
MCH: 29.9 pg (ref 26.0–34.0)
MCHC: 32.8 g/dL (ref 30.0–36.0)
MCV: 91.2 fL (ref 78.0–100.0)
Monocytes Absolute: 2.3 10*3/uL — ABNORMAL HIGH (ref 0.1–1.0)
Monocytes Relative: 12 %
Neutro Abs: 15.4 10*3/uL — ABNORMAL HIGH (ref 1.7–7.7)
Neutrophils Relative %: 80 %
Platelets: 270 10*3/uL (ref 150–400)
RBC: 3.54 MIL/uL — ABNORMAL LOW (ref 3.87–5.11)
RDW: 15.8 % — ABNORMAL HIGH (ref 11.5–15.5)
WBC: 19.3 10*3/uL — ABNORMAL HIGH (ref 4.0–10.5)

## 2016-12-14 LAB — COMPREHENSIVE METABOLIC PANEL
ALT: 12 U/L — ABNORMAL LOW (ref 14–54)
AST: 18 U/L (ref 15–41)
Albumin: 4 g/dL (ref 3.5–5.0)
Alkaline Phosphatase: 98 U/L (ref 38–126)
Anion gap: 8 (ref 5–15)
BUN: 7 mg/dL (ref 6–20)
CO2: 29 mmol/L (ref 22–32)
Calcium: 9.1 mg/dL (ref 8.9–10.3)
Chloride: 102 mmol/L (ref 101–111)
Creatinine, Ser: 0.59 mg/dL (ref 0.44–1.00)
GFR calc Af Amer: >60 mL/min (ref >60)
GFR calc non Af Amer: >60 mL/min (ref >60)
Glucose, Bld: 97 mg/dL (ref 65–99)
Potassium: 3.2 mmol/L — ABNORMAL LOW (ref 3.5–5.1)
Sodium: 139 mmol/L (ref 135–145)
Total Bilirubin: 0.3 mg/dL (ref 0.3–1.2)
Total Protein: 7 g/dL (ref 6.5–8.1)

## 2016-12-14 LAB — URINALYSIS, DIPSTICK ONLY
Bilirubin Urine: NEGATIVE
Glucose, UA: NEGATIVE mg/dL
Hgb urine dipstick: NEGATIVE
Ketones, ur: NEGATIVE mg/dL
Leukocytes, UA: NEGATIVE
Nitrite: NEGATIVE
Protein, ur: NEGATIVE mg/dL
Specific Gravity, Urine: <1.005 — ABNORMAL LOW (ref 1.005–1.030)
pH: 5.5 (ref 5.0–8.0)

## 2016-12-14 MED ORDER — DOXORUBICIN HCL CHEMO IV INJECTION 2 MG/ML
60.0000 mg/m2 | Freq: Once | INTRAVENOUS | Status: AC
  Administered 2016-12-14: 106 mg via INTRAVENOUS
  Filled 2016-12-14: qty 53

## 2016-12-14 MED ORDER — SODIUM CHLORIDE 0.9 % IV SOLN
Freq: Once | INTRAVENOUS | Status: AC
  Administered 2016-12-14: 12:00:00 via INTRAVENOUS
  Filled 2016-12-14: qty 5

## 2016-12-14 MED ORDER — PEGFILGRASTIM 6 MG/0.6ML ~~LOC~~ PSKT
6.0000 mg | PREFILLED_SYRINGE | Freq: Once | SUBCUTANEOUS | Status: AC
  Administered 2016-12-14: 6 mg via SUBCUTANEOUS
  Filled 2016-12-14: qty 0.6

## 2016-12-14 MED ORDER — HEPARIN SOD (PORK) LOCK FLUSH 100 UNIT/ML IV SOLN
500.0000 [IU] | Freq: Once | INTRAVENOUS | Status: AC | PRN
  Administered 2016-12-14: 500 [IU]
  Filled 2016-12-14 (×2): qty 5

## 2016-12-14 MED ORDER — PALONOSETRON HCL INJECTION 0.25 MG/5ML
0.2500 mg | Freq: Once | INTRAVENOUS | Status: AC
  Administered 2016-12-14: 0.25 mg via INTRAVENOUS
  Filled 2016-12-14: qty 5

## 2016-12-14 MED ORDER — SODIUM CHLORIDE 0.9 % IV SOLN
Freq: Once | INTRAVENOUS | Status: AC
  Administered 2016-12-14: 11:00:00 via INTRAVENOUS

## 2016-12-14 MED ORDER — CYCLOPHOSPHAMIDE CHEMO INJECTION 1 GM
600.0000 mg/m2 | Freq: Once | INTRAMUSCULAR | Status: AC
  Administered 2016-12-14: 1060 mg via INTRAVENOUS
  Filled 2016-12-14: qty 53

## 2016-12-14 MED ORDER — SODIUM CHLORIDE 0.9% FLUSH
10.0000 mL | INTRAVENOUS | Status: DC | PRN
  Administered 2016-12-14: 10 mL
  Filled 2016-12-14: qty 10

## 2016-12-14 NOTE — Patient Instructions (Signed)
[facility] Discharge Instructions for Patients Receiving Chemotherapy   Beginning January 23rd 2017 lab work for the Cancer Center will be done in the  Main lab at [place] on 1st floor. If you have a lab appointment with the Cancer Center please come in thru the  Main Entrance and check in at the main information desk   Today you received the following chemotherapy agents   To help prevent nausea and vomiting after your treatment, we encourage you to take your nausea medication     If you develop nausea and vomiting, or diarrhea that is not controlled by your medication, call the clinic.  The clinic phone number is [phone]. Office hours are Monday-Friday 8:30am-5:00pm.  BELOW ARE SYMPTOMS THAT SHOULD BE REPORTED IMMEDIATELY:  *FEVER GREATER THAN 101.0 F  *CHILLS WITH OR WITHOUT FEVER  NAUSEA AND VOMITING THAT IS NOT CONTROLLED WITH YOUR NAUSEA MEDICATION  *UNUSUAL SHORTNESS OF BREATH  *UNUSUAL BRUISING OR BLEEDING  TENDERNESS IN MOUTH AND THROAT WITH OR WITHOUT PRESENCE OF ULCERS  *URINARY PROBLEMS  *BOWEL PROBLEMS  UNUSUAL RASH Items with * indicate a potential emergency and should be followed up as soon as possible. If you have an emergency after office hours please contact your primary care physician or go to the nearest emergency department.  Please call the clinic during office hours if you have any questions or concerns.   You may also contact the Patient Navigator at [phone] should you have any questions or need assistance in obtaining follow up care.      Resources For Cancer Patients and their Caregivers ? American Cancer Society: Can assist with transportation, wigs, general needs, runs Look Good Feel Better.        1-888-227-6333 ? Cancer Care: Provides financial assistance, online support groups, medication/co-pay assistance.  1-800-813-HOPE (4673) ? [facility] Assists [place] cancer  patients and their families through emotional , educational and financial support.  336-427-4357 ? [place] DSS Where to apply for food stamps, Medicaid and utility assistance. 336-342-1394 ? RCATS: Transportation to medical appointments. 336-347-2287 ? Social Security Administration: May apply for disability if have a Stage IV cancer. 336-342-7796 1-800-772-1213 ? [place] Aging, Disability and Transit Services: Assists with nutrition, care and transit needs. [phone]         

## 2016-12-14 NOTE — Progress Notes (Signed)
Chemotherapy given today per orders. Patient tolerated it well, no problems. Vitals stable and discharged home ambulatory from clinic. Follow up as scheduled. 

## 2016-12-26 NOTE — Progress Notes (Signed)
This encounter was created in error - please disregard.

## 2016-12-27 ENCOUNTER — Other Ambulatory Visit (HOSPITAL_COMMUNITY): Payer: Self-pay | Admitting: Oncology

## 2016-12-27 ENCOUNTER — Other Ambulatory Visit (HOSPITAL_COMMUNITY): Payer: Self-pay | Admitting: Emergency Medicine

## 2016-12-27 DIAGNOSIS — E876 Hypokalemia: Secondary | ICD-10-CM

## 2016-12-27 DIAGNOSIS — C50919 Malignant neoplasm of unspecified site of unspecified female breast: Secondary | ICD-10-CM

## 2016-12-27 NOTE — Progress Notes (Signed)
Explained to pt about the [facility] clinic and what it offered.  Pt would see occupational therapy , dietician, and social work.  Explained it was on 01/08/2017 at 9 am and would last about 2 hours.  This will be held on the second floor.  She verbalized understanding.   Referral for occupational therapy placed.

## 2016-12-28 ENCOUNTER — Encounter (HOSPITAL_COMMUNITY): Payer: Self-pay | Admitting: Oncology

## 2016-12-28 ENCOUNTER — Encounter (HOSPITAL_COMMUNITY): Payer: BLUE CROSS/BLUE SHIELD | Attending: Hematology & Oncology | Admitting: Oncology

## 2016-12-28 ENCOUNTER — Encounter (HOSPITAL_BASED_OUTPATIENT_CLINIC_OR_DEPARTMENT_OTHER): Payer: BLUE CROSS/BLUE SHIELD

## 2016-12-28 ENCOUNTER — Other Ambulatory Visit (HOSPITAL_COMMUNITY): Payer: Self-pay | Admitting: Oncology

## 2016-12-28 VITALS — BP 123/71 | HR 83 | Temp 98.1°F | Resp 16 | Wt 144.7 lb

## 2016-12-28 VITALS — BP 129/70 | HR 70 | Temp 97.5°F | Resp 18

## 2016-12-28 DIAGNOSIS — C50411 Malignant neoplasm of upper-outer quadrant of right female breast: Secondary | ICD-10-CM | POA: Diagnosis not present

## 2016-12-28 DIAGNOSIS — H04123 Dry eye syndrome of bilateral lacrimal glands: Secondary | ICD-10-CM | POA: Diagnosis not present

## 2016-12-28 DIAGNOSIS — J3489 Other specified disorders of nose and nasal sinuses: Secondary | ICD-10-CM

## 2016-12-28 DIAGNOSIS — D72825 Bandemia: Secondary | ICD-10-CM | POA: Diagnosis present

## 2016-12-28 DIAGNOSIS — Z5111 Encounter for antineoplastic chemotherapy: Secondary | ICD-10-CM | POA: Diagnosis not present

## 2016-12-28 DIAGNOSIS — E876 Hypokalemia: Secondary | ICD-10-CM

## 2016-12-28 DIAGNOSIS — C50911 Malignant neoplasm of unspecified site of right female breast: Secondary | ICD-10-CM

## 2016-12-28 DIAGNOSIS — Z171 Estrogen receptor negative status [ER-]: Secondary | ICD-10-CM

## 2016-12-28 LAB — CBC WITH DIFFERENTIAL/PLATELET
Basophils Absolute: 0.2 10*3/uL — ABNORMAL HIGH (ref 0.0–0.1)
Basophils Relative: 1 %
Eosinophils Absolute: 0 10*3/uL (ref 0.0–0.7)
Eosinophils Relative: 0 %
HCT: 31.5 % — ABNORMAL LOW (ref 36.0–46.0)
Hemoglobin: 10.4 g/dL — ABNORMAL LOW (ref 12.0–15.0)
Lymphocytes Relative: 10 %
Lymphs Abs: 1.6 10*3/uL (ref 0.7–4.0)
MCH: 30.2 pg (ref 26.0–34.0)
MCHC: 33 g/dL (ref 30.0–36.0)
MCV: 91.6 fL (ref 78.0–100.0)
Monocytes Absolute: 1.4 10*3/uL — ABNORMAL HIGH (ref 0.1–1.0)
Monocytes Relative: 9 %
Neutro Abs: 12.7 10*3/uL — ABNORMAL HIGH (ref 1.7–7.7)
Neutrophils Relative %: 80 %
Platelets: 216 10*3/uL (ref 150–400)
RBC: 3.44 MIL/uL — ABNORMAL LOW (ref 3.87–5.11)
RDW: 17.5 % — ABNORMAL HIGH (ref 11.5–15.5)
WBC: 15.9 10*3/uL — ABNORMAL HIGH (ref 4.0–10.5)

## 2016-12-28 LAB — COMPREHENSIVE METABOLIC PANEL
ALT: 12 U/L — ABNORMAL LOW (ref 14–54)
AST: 18 U/L (ref 15–41)
Albumin: 4.1 g/dL (ref 3.5–5.0)
Alkaline Phosphatase: 101 U/L (ref 38–126)
Anion gap: 7 (ref 5–15)
BUN: 7 mg/dL (ref 6–20)
CO2: 30 mmol/L (ref 22–32)
Calcium: 9.4 mg/dL (ref 8.9–10.3)
Chloride: 103 mmol/L (ref 101–111)
Creatinine, Ser: 0.52 mg/dL (ref 0.44–1.00)
GFR calc Af Amer: >60 mL/min (ref >60)
GFR calc non Af Amer: >60 mL/min (ref >60)
Glucose, Bld: 93 mg/dL (ref 65–99)
Potassium: 3.1 mmol/L — ABNORMAL LOW (ref 3.5–5.1)
Sodium: 140 mmol/L (ref 135–145)
Total Bilirubin: 0.4 mg/dL (ref 0.3–1.2)
Total Protein: 6.7 g/dL (ref 6.5–8.1)

## 2016-12-28 MED ORDER — SODIUM CHLORIDE 0.9 % IV SOLN: Freq: Once | INTRAVENOUS | Status: DC

## 2016-12-28 MED ORDER — FAMOTIDINE IN NACL 20-0.9 MG/50ML-% IV SOLN
20.0000 mg | Freq: Once | INTRAVENOUS | Status: AC
  Administered 2016-12-28: 20 mg via INTRAVENOUS

## 2016-12-28 MED ORDER — SODIUM CHLORIDE 0.9 % IV SOLN
Freq: Once | INTRAVENOUS | Status: AC
  Administered 2016-12-28: 12:00:00 via INTRAVENOUS

## 2016-12-28 MED ORDER — HEPARIN SOD (PORK) LOCK FLUSH 100 UNIT/ML IV SOLN
500.0000 [IU] | Freq: Once | INTRAVENOUS | Status: AC | PRN
  Administered 2016-12-28: 500 [IU]
  Filled 2016-12-28: qty 5

## 2016-12-28 MED ORDER — FAMOTIDINE IN NACL 20-0.9 MG/50ML-% IV SOLN
INTRAVENOUS | Status: AC
  Filled 2016-12-28: qty 50

## 2016-12-28 MED ORDER — POTASSIUM CHLORIDE CRYS ER 20 MEQ PO TBCR
60.0000 meq | EXTENDED_RELEASE_TABLET | Freq: Once | ORAL | Status: AC
  Administered 2016-12-28: 60 meq via ORAL

## 2016-12-28 MED ORDER — DIPHENHYDRAMINE HCL 50 MG/ML IJ SOLN
50.0000 mg | Freq: Once | INTRAMUSCULAR | Status: AC
  Administered 2016-12-28: 50 mg via INTRAVENOUS
  Filled 2016-12-28: qty 1

## 2016-12-28 MED ORDER — SODIUM CHLORIDE 0.9 % IV SOLN
20.0000 mg | Freq: Once | INTRAVENOUS | Status: AC
  Administered 2016-12-28: 20 mg via INTRAVENOUS
  Filled 2016-12-28: qty 2

## 2016-12-28 MED ORDER — PACLITAXEL CHEMO INJECTION 300 MG/50ML
80.0000 mg/m2 | Freq: Once | INTRAVENOUS | Status: AC
  Administered 2016-12-28: 138 mg via INTRAVENOUS
  Filled 2016-12-28: qty 23

## 2016-12-28 MED ORDER — ONDANSETRON 8 MG PO TBDP
8.0000 mg | ORAL_TABLET | Freq: Once | ORAL | Status: AC
  Administered 2016-12-28: 8 mg via ORAL
  Filled 2016-12-28: qty 1

## 2016-12-28 MED ORDER — POTASSIUM CHLORIDE CRYS ER 20 MEQ PO TBCR
EXTENDED_RELEASE_TABLET | ORAL | Status: AC
  Filled 2016-12-28: qty 3

## 2016-12-28 MED ORDER — SODIUM CHLORIDE 0.9% FLUSH
10.0000 mL | INTRAVENOUS | Status: DC | PRN
  Administered 2016-12-28: 10 mL
  Filled 2016-12-28: qty 10

## 2016-12-28 NOTE — Assessment & Plan Note (Addendum)
Stage IA (T1cN0M0) invasive ductal carcinoma of right breast, upper outer quadrant, HER2 NEGATIVE, ER/PR NEGATIVE.  S/P 4 cycles of AC systemic chemotherapy with Neulasta support and now on weekly T x 12.  Oncology history is updated.  Pre-treatment labs today: CBC diff, CMET.  I personally reviewed and went over laboratory results with the patient.  The results are noted within this dictation. Labs satisfy treatment parameters.  Hypokalemia is noted.  She picked up her Kdur refill yesterday.  I will give 60 mEq K+ today in the clinic.  I have recommended OTC eye drops for dry eyes, such as Similasan.  I have recommended humidifier at HS in bedroom to help with nasal dryness.  We discussed Taxol-specific side effects including myelosuppression with dose limiting neutropenia nadir at day 8-10 and recovery by day 15-21, hypersensitivity reaction occuring in 20-40% of patients, neurotoxicity mainly in the form of sensory neuropathy with numbness and paresthesias, transient asymptomatic bradycardia is the most commonly observed cardiotoxicity, alopecia occurs in nearly all patients with total body hair loss, mucositis and/or diarrhea in 30-40% of patients, transient elevations in serum transaminases, bilirubin, and alkaline phophatase, and onycholysis which is mainly observed in those receiving >6 courses on a weekly schedule and not typical in those receiving paclitaxel on an every 3 week schedule.  Return next week for follow-up and cycle #2 of T. 

## 2016-12-28 NOTE — Patient Instructions (Signed)
[place] at [facility] Discharge Instructions  RECOMMENDATIONS MADE BY THE CONSULTANT AND ANY TEST RESULTS WILL BE SENT TO YOUR REFERRING PHYSICIAN.  You were seen today by [name], PA-C Return next week as scheduled  We sent your potassium to the pharmacy Try something for dry eyes over the counter Get a humidifier and use it at bedtime.  Sit on nightstand next to your bed.  Thank you for choosing [place] at [facility] to provide your oncology and hematology care.  To afford each patient quality time with our provider, please arrive at least 15 minutes before your scheduled appointment time.    If you have a lab appointment with the [facility] please come in thru the  Main Entrance and check in at the main information desk  You need to re-schedule your appointment should you arrive 10 or more minutes late.  We strive to give you quality time with our providers, and arriving late affects you and other patients whose appointments are after yours.  Also, if you no show three or more times for appointments you may be dismissed from the clinic at the providers discretion.     Again, thank you for choosing [facility].  Our hope is that these requests will decrease the amount of time that you wait before being seen by our physicians.       _____________________________________________________________  Should you have questions after your visit to [facility], please contact our office at [phone] between the hours of 8:30 a.m. and 4:30 p.m.  Voicemails left after 4:30 p.m. will not be returned until the following business day.  For prescription refill requests, have your pharmacy contact our office.       Resources For Cancer Patients and their Caregivers ? American Cancer Society: Can assist with transportation, wigs, general needs, runs Look Good Feel Better.        743-666-5150 ? Cancer  Care: Provides financial assistance, online support groups, medication/co-pay assistance.  1-800-813-HOPE 9410124031) ? [facility] Assists [place] cancer patients and their families through emotional , educational and financial support.  580 556 1676 ? [place] DSS Where to apply for food stamps, Medicaid and utility assistance. (912) 441-7401 ? RCATS: Transportation to medical appointments. (573)727-5390 ? Social Security Administration: May apply for disability if have a Stage IV cancer. 352-091-3258 (415)452-3657 ? [employer], Disability and Transit Services: Assists with nutrition, care and transit needs. [place] Support Programs: @10RELATIVEDAYS @ > Cancer Support Group  2nd Tuesday of the month 1pm-2pm, Journey Room  > Creative Journey  3rd Tuesday of the month 1130am-1pm, Journey Room  > Look Good Feel Better  1st Wednesday of the month 10am-12 noon, Journey Room (Call [place] to register [phone])

## 2016-12-28 NOTE — Progress Notes (Signed)
[name] tolerated chemo tx well without complaints or incident. Labs reviewed prior to administering chemotherapy. Potassium given PO as ordered.Reviewed purpose and side effects of Taxol with pt who verbalized understanding VSS upon discharge. Pt discharged self ambulatory in satisfactory condition accompanied by her mother

## 2016-12-28 NOTE — Patient Instructions (Signed)
[facility] Discharge Instructions for Patients Receiving Chemotherapy   Beginning January 23rd 2017 lab work for the Cancer Center will be done in the  Main lab at [facility] on 1st floor. If you have a lab appointment with the Cancer Center please come in thru the  Main Entrance and check in at the main information desk   Today you received the following chemotherapy agents Taxol. Follow-up as scheduled. Call clinic for any questions or concerns  To help prevent nausea and vomiting after your treatment, we encourage you to take your nausea medication   If you develop nausea and vomiting, or diarrhea that is not controlled by your medication, call the clinic.  The clinic phone number is [phone]. Office hours are Monday-Friday 8:30am-5:00pm.  BELOW ARE SYMPTOMS THAT SHOULD BE REPORTED IMMEDIATELY:  *FEVER GREATER THAN 101.0 F  *CHILLS WITH OR WITHOUT FEVER  NAUSEA AND VOMITING THAT IS NOT CONTROLLED WITH YOUR NAUSEA MEDICATION  *UNUSUAL SHORTNESS OF BREATH  *UNUSUAL BRUISING OR BLEEDING  TENDERNESS IN MOUTH AND THROAT WITH OR WITHOUT PRESENCE OF ULCERS  *URINARY PROBLEMS  *BOWEL PROBLEMS  UNUSUAL RASH Items with * indicate a potential emergency and should be followed up as soon as possible. If you have an emergency after office hours please contact your primary care physician or go to the nearest emergency department.  Please call the clinic during office hours if you have any questions or concerns.   You may also contact the Patient Navigator at [phone] should you have any questions or need assistance in obtaining follow up care.      Resources For Cancer Patients and their Caregivers ? American Cancer Society: Can assist with transportation, wigs, general needs, runs Look Good Feel Better.        1-888-227-6333 ? Cancer Care: Provides financial assistance, online support groups, medication/co-pay assistance.  1-800-813-HOPE  (4673) ? [facility] Assists [place] cancer patients and their families through emotional , educational and financial support.  336-427-4357 ? [place] DSS Where to apply for food stamps, Medicaid and utility assistance. 336-342-1394 ? RCATS: Transportation to medical appointments. 336-347-2287 ? Social Security Administration: May apply for disability if have a Stage IV cancer. 336-342-7796 1-800-772-1213 ? [place] Aging, Disability and Transit Services: Assists with nutrition, care and transit needs. 336-349-2343         

## 2016-12-28 NOTE — Progress Notes (Signed)
[facility], MD [place] [address]  Infiltrating ductal carcinoma of right breast ([place])  Hypokalemia - Plan: potassium chloride SA (K-DUR,KLOR-CON) CR tablet 60 mEq  CURRENT THERAPY: AC +T beginning on 11/02/2016  INTERVAL HISTORY: Kimberly Roman 61 y.o. female returns for followup of Stage IA (T1cN0M0) invasive ductal carcinoma of right breast, upper outer quadrant, HER2 NEGATIVE, ER/PR NEGATIVE.    Invasive ductal carcinoma of breast ([place])   08/13/2016 Mammogram    BI-RADS CATEGORY  0: Incomplete.      08/21/2016 Mammogram    BI-RADS CATEGORY  5: Highly suggestive of malignancy. Suspicious 1.2 cm mass 10 o'clock position right breast.      08/21/2016 Breast US    Targeted ultrasound is performed, showing a hypoechoic irregular mass with indistinct margins at 10 o'clock position 9 cm from the nipple measuring approximately 1.1 x 1.2 x 1.0 cm. No additional masses are seen in this region of the right breast on ultrasound.      08/28/2016 Procedure    Right needle core biopsy      09/26/2016 Procedure    Right upper outer quadrant lumpectomy by Dr. Arnoldo Morale      09/28/2016 Pathology Results    Invasive ductal carcinoma, grade 3, standing 1.7 cm. High-grade ductal carcinoma in situ with comedonecrosis. Invasive carcinoma in less than 0.1 cm of the superior medial margin focally. In situ carcinoma is less than 0.1 cm of superior medial margin and the posterior margin focally. No LVI. ER negative. PR negative. HER-2 negative. KI-67 90%.      09/28/2016 Cancer Staging    Invasive ductal carcinoma of breast [facility])   Staging form: Breast, AJCC 7th Edition   - Clinical stage from 09/28/2016: Stage IA (T1c, N0, M0) - Signed by Baird Cancer, PA-C on 10/22/2016      09/29/2016 Pathology Results    Invasive ductal carcinoma, grade 2/3. HER-2 negative. ER negative. PR negative. Ki-67 90%.      10/24/2016 Echocardiogram    - Left  ventricle: The cavity size was normal. Wall thickness was   normal. Systolic function was normal. The estimated ejection   fraction was in the range of 60% to 65%. Wall motion was normal;   there were no regional wall motion abnormalities.       10/26/2016 Procedure    Port placed placed by Dr. Arnoldo Morale      11/02/2016 -  Chemotherapy    The patient had DOXOrubicin (ADRIAMYCIN) chemo injection 106 mg, 60 mg/m2 = 106 mg, Intravenous,  Once, 4 of 4 cycles  palonosetron (ALOXI) injection 0.25 mg, 0.25 mg, Intravenous,  Once, 4 of 4 cycles  pegfilgrastim (NEULASTA ONPRO KIT) injection 6 mg, 6 mg, Subcutaneous, Once, 4 of 4 cycles  cyclophosphamide (CYTOXAN) 1,060 mg in sodium chloride 0.9 % 250 mL chemo infusion, 600 mg/m2 = 1,060 mg, Intravenous,  Once, 4 of 4 cycles  fosaprepitant (EMEND) 150 mg, dexamethasone (DECADRON) 12 mg in sodium chloride 0.9 % 145 mL IVPB, , Intravenous,  Once, 4 of 4 cycles  PACLitaxel (TAXOL) 138 mg in dextrose 5 % 250 mL chemo infusion ( for chemotherapy treatment.         She has tolerated her 4 cycles of AC well without any issues.  She denies any nausea or vomiting.  She reports a stable appetite.  She is here to start paclitaxel chemotherapy.  I have reviewed some of the more common and unique side  effects of this therapy.  She notes issues with itching and watery eyes.  I suspect she has dry eye issues.  I have recommended Similasan for this issue.  She also reports dry mucous membranes of the nares.  She still has her heat on in the house for her elderly mother who enjoys warm temperatures in the house.  [name] reports that she sleeps near the heater at home.  Review of Systems  Constitutional: Negative.  Negative for chills, fever and weight loss.  HENT: Negative.  Negative for congestion, sinus pain and sore throat.   Eyes: Positive for discharge.       Eye pruritis  Respiratory: Negative.  Negative for cough and sputum production.     Cardiovascular: Negative.  Negative for chest pain.  Gastrointestinal: Negative for constipation, diarrhea, nausea and vomiting.  Genitourinary: Negative.  Negative for dysuria.  Musculoskeletal: Negative for back pain.  Skin: Negative.  Negative for rash.  Neurological: Negative.  Negative for weakness.  Endo/Heme/Allergies: Negative.   Psychiatric/Behavioral: Negative.     Past Medical History:  Diagnosis Date  . Ankylosing spondylitis ([place])   . Arthritis   . Breast cancer ([place])   . Carpal tunnel syndrome   . Environmental allergies   . GERD (gastroesophageal reflux disease)   . Hyperlipemia   . Hypertension   . Invasive ductal carcinoma of breast ([place]) 10/22/2016    Past Surgical History:  Procedure Laterality Date  . ABDOMINAL HYSTERECTOMY    . CARPAL TUNNEL RELEASE Right 04/27/2013   Procedure: RIGHT CARPAL TUNNEL RELEASE;  Surgeon: Carole Civil, MD;  Location: [facility] ORS;  Service: Orthopedics;  Laterality: Right;  . COLONOSCOPY N/A 01/20/2013   Procedure: COLONOSCOPY;  Surgeon: Danie Binder, MD;  Location: [facility] ENDO SUITE;  Service: Endoscopy;  Laterality: N/A;  9:30 AM  . HEMATOMA EVACUATION Right 10/26/2016   Procedure: EVACUATION HEMATOMA;  Surgeon: Aviva Signs, MD;  Location: [facility] ORS;  Service: General;  Laterality: Right;  . PARTIAL MASTECTOMY WITH AXILLARY SENTINEL LYMPH NODE BIOPSY Right 09/26/2016   Procedure: PARTIAL MASTECTOMY WITH AXILLARY SENTINEL LYMPH NODE BIOPSY;  Surgeon: Aviva Signs, MD;  Location: [facility] ORS;  Service: General;  Laterality: Right;  . PORTACATH PLACEMENT Left 10/26/2016   Procedure: INSERTION PORT-A-CATH;  Surgeon: Aviva Signs, MD;  Location: [facility] ORS;  Service: General;  Laterality: Left;    Family History  Problem Relation Age of Onset  . Arthritis    . Hypertension Mother   . Hypertension Father   . Colon cancer Neg Hx     Social History   Social History  . Marital status: Single    Spouse name: N/A  . Number of children: N/A   . Years of education: N/A   Social History Main Topics  . Smoking status: Never Smoker  . Smokeless tobacco: Never Used  . Alcohol use No  . Drug use: No  . Sexual activity: Not Asked   Other Topics Concern  . None   Social History Narrative  . None     PHYSICAL EXAMINATION  ECOG PERFORMANCE STATUS: 1 - Symptomatic but completely ambulatory  Vitals:   12/28/16 1113  BP: 123/71  Pulse: 83  Resp: 16  Temp: 98.1 F (36.7 C)     GENERAL:alert, no distress, well nourished, well developed, comfortable, cooperative, smiling, accompanied by her mother. SKIN: skin color, texture, turgor are normal, no rashes.  HEAD: Normocephalic, No masses, lesions, tenderness or abnormalities EYES: normal, EOMI, Conjunctiva are pink.  Minimal erythema  of sclera bilaterally, clear watery discharge from medial aspect of eyes. EARS: External ears normal OROPHARYNX:lips, buccal mucosa, and tongue normal and mucous membranes are moist  NECK: supple, trachea midline LYMPH:  not examined BREAST:not examined LUNGS: clear to auscultation  HEART: regular rate & rhythm ABDOMEN:abdomen soft and normal bowel sounds BACK: Back symmetric, no curvature.   EXTREMITIES:less then 2 second capillary refill, no joint deformities, effusion, or inflammation, no skin discoloration, no cyanosis  NEURO: alert & oriented x 3 with fluent speech, no focal motor/sensory deficits, gait normal   LABORATORY DATA: CBC    Component Value Date/Time   WBC 15.9 (H) 12/28/2016 1128   RBC 3.44 (L) 12/28/2016 1128   HGB 10.4 (L) 12/28/2016 1128   HCT 31.5 (L) 12/28/2016 1128   PLT 216 12/28/2016 1128   MCV 91.6 12/28/2016 1128   MCH 30.2 12/28/2016 1128   MCHC 33.0 12/28/2016 1128   RDW 17.5 (H) 12/28/2016 1128   LYMPHSABS PENDING 12/28/2016 1128   MONOABS PENDING 12/28/2016 1128   EOSABS PENDING 12/28/2016 1128   BASOSABS PENDING 12/28/2016 1128      Chemistry      Component Value Date/Time   NA 140 12/28/2016  1128   K 3.1 (L) 12/28/2016 1128   CL 103 12/28/2016 1128   CO2 30 12/28/2016 1128   BUN 7 12/28/2016 1128   CREATININE 0.52 12/28/2016 1128      Component Value Date/Time   CALCIUM 9.4 12/28/2016 1128   ALKPHOS 101 12/28/2016 1128   AST 18 12/28/2016 1128   ALT 12 (L) 12/28/2016 1128   BILITOT 0.4 12/28/2016 1128        PENDING LABS:   RADIOGRAPHIC STUDIES:  No results found.   PATHOLOGY:    ASSESSMENT AND PLAN:  Invasive ductal carcinoma of breast ([place]) Stage IA (T1cN0M0) invasive ductal carcinoma of right breast, upper outer quadrant, HER2 NEGATIVE, ER/PR NEGATIVE.  S/P 4 cycles of AC systemic chemotherapy with Neulasta support and now on weekly T x 12.  Oncology history is updated.  Pre-treatment labs today: CBC diff, CMET.  I personally reviewed and went over laboratory results with the patient.  The results are noted within this dictation. Labs satisfy treatment parameters.  Hypokalemia is noted.  She picked up her Kdur refill yesterday.  I will give 60 mEq K+ today in the clinic.  I have recommended OTC eye drops for dry eyes, such as Similasan.  I have recommended humidifier at HS in bedroom to help with nasal dryness.  We discussed Taxol-specific side effects including myelosuppression with dose limiting neutropenia nadir at day 8-10 and recovery by day 15-21, hypersensitivity reaction occuring in 20-40% of patients, neurotoxicity mainly in the form of sensory neuropathy with numbness and paresthesias, transient asymptomatic bradycardia is the most commonly observed cardiotoxicity, alopecia occurs in nearly all patients with total body hair loss, mucositis and/or diarrhea in 30-40% of patients, transient elevations in serum transaminases, bilirubin, and alkaline phophatase, and onycholysis which is mainly observed in those receiving >6 courses on a weekly schedule and not typical in those receiving paclitaxel on an every 3 week schedule.  Return next week for  follow-up and cycle #2 of T.   ORDERS PLACED FOR THIS ENCOUNTER: No orders of the defined types were placed in this encounter.   MEDICATIONS PRESCRIBED THIS ENCOUNTER: Meds ordered this encounter  Medications  . lidocaine-prilocaine (EMLA) cream    Refill:  2  . ondansetron (ZOFRAN) 8 MG tablet    Refill:  0    THERAPY PLAN:  [place] of 12 weekly cycle of T, after completing 4 cycles of AC.  All questions were answered. The patient knows to call the clinic with any problems, questions or concerns. We can certainly see the patient much sooner if necessary.  Patient and plan discussed with Dr. Twana First and she is in agreement with the aforementioned.   This note is electronically signed by: Doy Mince 12/28/2016 12:17 PM

## 2016-12-31 ENCOUNTER — Telehealth (HOSPITAL_COMMUNITY): Payer: Self-pay

## 2016-12-31 NOTE — Telephone Encounter (Signed)
See telephone encounter note.

## 2017-01-01 ENCOUNTER — Encounter: Payer: Self-pay | Admitting: *Deleted

## 2017-01-01 NOTE — Progress Notes (Signed)
[facility] Clinical Social Work  Clinical Social Work was referred by need for CSW follow up.  Clinical Social Worker phoned patient at home to offer support and assess for needs.  Pt was approved for [employer] in [place] and was denied for [employer]. CSW reviewed additional resources for support. Pt plans to call Cancer care and CSW and pt to work on [employer] application when she returns to [facility] next week. Pt coping well overall.     Clinical Social Work interventions: Resource follow up  [employer], [place] Tuesdays   [phone]

## 2017-01-04 ENCOUNTER — Encounter (HOSPITAL_COMMUNITY): Payer: BLUE CROSS/BLUE SHIELD | Attending: Adult Health

## 2017-01-04 ENCOUNTER — Encounter (HOSPITAL_COMMUNITY): Payer: BLUE CROSS/BLUE SHIELD | Admitting: Adult Health

## 2017-01-04 ENCOUNTER — Other Ambulatory Visit (HOSPITAL_COMMUNITY): Payer: Self-pay | Admitting: Oncology

## 2017-01-04 VITALS — BP 118/61 | HR 81 | Temp 98.0°F | Resp 18 | Wt 148.0 lb

## 2017-01-04 DIAGNOSIS — Z5111 Encounter for antineoplastic chemotherapy: Secondary | ICD-10-CM

## 2017-01-04 DIAGNOSIS — C50411 Malignant neoplasm of upper-outer quadrant of right female breast: Secondary | ICD-10-CM | POA: Diagnosis not present

## 2017-01-04 DIAGNOSIS — C50911 Malignant neoplasm of unspecified site of right female breast: Secondary | ICD-10-CM | POA: Diagnosis present

## 2017-01-04 LAB — CBC WITH DIFFERENTIAL/PLATELET
Basophils Absolute: 0 10*3/uL (ref 0.0–0.1)
Basophils Relative: 1 %
Eosinophils Absolute: 0 10*3/uL (ref 0.0–0.7)
Eosinophils Relative: 0 %
HCT: 29.3 % — ABNORMAL LOW (ref 36.0–46.0)
Hemoglobin: 9.5 g/dL — ABNORMAL LOW (ref 12.0–15.0)
Lymphocytes Relative: 14 %
Lymphs Abs: 0.9 10*3/uL (ref 0.7–4.0)
MCH: 29.8 pg (ref 26.0–34.0)
MCHC: 32.4 g/dL (ref 30.0–36.0)
MCV: 91.8 fL (ref 78.0–100.0)
Monocytes Absolute: 0.8 10*3/uL (ref 0.1–1.0)
Monocytes Relative: 13 %
Neutro Abs: 4.7 10*3/uL (ref 1.7–7.7)
Neutrophils Relative %: 72 %
Platelets: 355 10*3/uL (ref 150–400)
RBC: 3.19 MIL/uL — ABNORMAL LOW (ref 3.87–5.11)
RDW: 17.7 % — ABNORMAL HIGH (ref 11.5–15.5)
WBC: 6.4 10*3/uL (ref 4.0–10.5)

## 2017-01-04 LAB — COMPREHENSIVE METABOLIC PANEL
ALT: 15 U/L (ref 14–54)
AST: 17 U/L (ref 15–41)
Albumin: 3.9 g/dL (ref 3.5–5.0)
Alkaline Phosphatase: 72 U/L (ref 38–126)
Anion gap: 8 (ref 5–15)
BUN: 11 mg/dL (ref 6–20)
CO2: 29 mmol/L (ref 22–32)
Calcium: 9.2 mg/dL (ref 8.9–10.3)
Chloride: 104 mmol/L (ref 101–111)
Creatinine, Ser: 0.53 mg/dL (ref 0.44–1.00)
GFR calc Af Amer: >60 mL/min (ref >60)
GFR calc non Af Amer: >60 mL/min (ref >60)
Glucose, Bld: 99 mg/dL (ref 65–99)
Potassium: 3.2 mmol/L — ABNORMAL LOW (ref 3.5–5.1)
Sodium: 141 mmol/L (ref 135–145)
Total Bilirubin: 0.5 mg/dL (ref 0.3–1.2)
Total Protein: 6.4 g/dL — ABNORMAL LOW (ref 6.5–8.1)

## 2017-01-04 MED ORDER — SODIUM CHLORIDE 0.9 % IV SOLN: Freq: Once | INTRAVENOUS | Status: DC

## 2017-01-04 MED ORDER — DIPHENHYDRAMINE HCL 50 MG/ML IJ SOLN
50.0000 mg | Freq: Once | INTRAMUSCULAR | Status: AC
Start: 2017-01-04 — End: 2017-01-04
  Administered 2017-01-04: 50 mg via INTRAVENOUS
  Filled 2017-01-04: qty 1

## 2017-01-04 MED ORDER — SODIUM CHLORIDE 0.9 % IV SOLN
20.0000 mg | Freq: Once | INTRAVENOUS | Status: AC
  Administered 2017-01-04: 20 mg via INTRAVENOUS
  Filled 2017-01-04: qty 2

## 2017-01-04 MED ORDER — ONDANSETRON 8 MG PO TBDP
8.0000 mg | ORAL_TABLET | Freq: Once | ORAL | Status: AC
  Administered 2017-01-04: 8 mg via ORAL
  Filled 2017-01-04: qty 1

## 2017-01-04 MED ORDER — SODIUM CHLORIDE 0.9% FLUSH
10.0000 mL | INTRAVENOUS | Status: DC | PRN
  Administered 2017-01-04: 10 mL
  Filled 2017-01-04: qty 10

## 2017-01-04 MED ORDER — HEPARIN SOD (PORK) LOCK FLUSH 100 UNIT/ML IV SOLN
INTRAVENOUS | Status: AC
  Filled 2017-01-04: qty 5

## 2017-01-04 MED ORDER — SODIUM CHLORIDE 0.9 % IV SOLN
Freq: Once | INTRAVENOUS | Status: AC
  Administered 2017-01-04: 12:00:00 via INTRAVENOUS

## 2017-01-04 MED ORDER — HEPARIN SOD (PORK) LOCK FLUSH 100 UNIT/ML IV SOLN
500.0000 [IU] | Freq: Once | INTRAVENOUS | Status: AC | PRN
  Administered 2017-01-04: 500 [IU]
  Filled 2017-01-04: qty 5

## 2017-01-04 MED ORDER — PACLITAXEL CHEMO INJECTION 300 MG/50ML
80.0000 mg/m2 | Freq: Once | INTRAVENOUS | Status: AC
  Administered 2017-01-04: 138 mg via INTRAVENOUS
  Filled 2017-01-04: qty 23

## 2017-01-04 MED ORDER — FAMOTIDINE IN NACL 20-0.9 MG/50ML-% IV SOLN
20.0000 mg | Freq: Once | INTRAVENOUS | Status: AC
  Administered 2017-01-04: 20 mg via INTRAVENOUS
  Filled 2017-01-04: qty 50

## 2017-01-04 NOTE — Progress Notes (Deleted)
[place], [place] [address]   CLINIC:  Medical Oncology/Hematology  PCP:  Rosita Fire, MD [place] [address] [phone]   REASON FOR VISIT:  Follow-up for Stage IA (T1cN0M0) invasive ductal carcinoma of right breast; ER-/PR-/HER2-  CURRENT THERAPY: Adjuvant chemotherapy; s/p Adriamycin/Cytoxan x 4; currently weekly Taxol  BRIEF ONCOLOGIC HISTORY:    Invasive ductal carcinoma of breast ([place])   08/13/2016 Mammogram    BI-RADS CATEGORY  0: Incomplete.      08/21/2016 Mammogram    BI-RADS CATEGORY  5: Highly suggestive of malignancy. Suspicious 1.2 cm mass 10 o'clock position right breast.      08/21/2016 Breast US    Targeted ultrasound is performed, showing a hypoechoic irregular mass with indistinct margins at 10 o'clock position 9 cm from the nipple measuring approximately 1.1 x 1.2 x 1.0 cm. No additional masses are seen in this region of the right breast on ultrasound.      08/28/2016 Procedure    Right needle core biopsy      09/26/2016 Procedure    Right upper outer quadrant lumpectomy by Dr. Arnoldo Morale      09/28/2016 Pathology Results    Invasive ductal carcinoma, grade 3, standing 1.7 cm. High-grade ductal carcinoma in situ with comedonecrosis. Invasive carcinoma in less than 0.1 cm of the superior medial margin focally. In situ carcinoma is less than 0.1 cm of superior medial margin and the posterior margin focally. No LVI. ER negative. PR negative. HER-2 negative. KI-67 90%.      09/28/2016 Cancer Staging    Invasive ductal carcinoma of breast [facility])   Staging form: Breast, AJCC 7th Edition   - Clinical stage from 09/28/2016: Stage IA (T1c, N0, M0) - Signed by Baird Cancer, PA-C on 10/22/2016      09/29/2016 Pathology Results    Invasive ductal carcinoma, grade 2/3. HER-2 negative. ER negative. PR negative. Ki-67 90%.      10/24/2016 Echocardiogram    - Left ventricle: The cavity size was  normal. Wall thickness was   normal. Systolic function was normal. The estimated ejection   fraction was in the range of 60% to 65%. Wall motion was normal;   there were no regional wall motion abnormalities.       10/26/2016 Procedure    Port placed placed by Dr. Arnoldo Morale      11/02/2016 -  Chemotherapy    The patient had DOXOrubicin (ADRIAMYCIN) chemo injection 106 mg, 60 mg/m2 = 106 mg, Intravenous,  Once, 4 of 4 cycles  palonosetron (ALOXI) injection 0.25 mg, 0.25 mg, Intravenous,  Once, 4 of 4 cycles  pegfilgrastim (NEULASTA ONPRO KIT) injection 6 mg, 6 mg, Subcutaneous, Once, 4 of 4 cycles  cyclophosphamide (CYTOXAN) 1,060 mg in sodium chloride 0.9 % 250 mL chemo infusion, 600 mg/m2 = 1,060 mg, Intravenous,  Once, 4 of 4 cycles  fosaprepitant (EMEND) 150 mg, dexamethasone (DECADRON) 12 mg in sodium chloride 0.9 % 145 mL IVPB, , Intravenous,  Once, 4 of 4 cycles  PACLitaxel (TAXOL) 138 mg in dextrose 5 % 250 mL chemo infusion ( for chemotherapy treatment.          INTERVAL HISTORY:  Ms. Baar returns for follow-up and consideration for cycle #2 adjuvant Taxol. ***    REVIEW OF SYSTEMS:  Review of Systems - Oncology   PAST MEDICAL/SURGICAL HISTORY:  Past Medical History:  Diagnosis Date  . Ankylosing spondylitis ([place])   . Arthritis   .  Breast cancer ([place])   . Carpal tunnel syndrome   . Environmental allergies   . GERD (gastroesophageal reflux disease)   . Hyperlipemia   . Hypertension   . Invasive ductal carcinoma of breast ([place]) 10/22/2016   Past Surgical History:  Procedure Laterality Date  . ABDOMINAL HYSTERECTOMY    . CARPAL TUNNEL RELEASE Right 04/27/2013   Procedure: RIGHT CARPAL TUNNEL RELEASE;  Surgeon: Carole Civil, MD;  Location: [facility] ORS;  Service: Orthopedics;  Laterality: Right;  . COLONOSCOPY N/A 01/20/2013   Procedure: COLONOSCOPY;  Surgeon: Danie Binder, MD;  Location: [facility] ENDO SUITE;  Service: Endoscopy;  Laterality: N/A;  9:30 AM  .  HEMATOMA EVACUATION Right 10/26/2016   Procedure: EVACUATION HEMATOMA;  Surgeon: Aviva Signs, MD;  Location: [facility] ORS;  Service: General;  Laterality: Right;  . PARTIAL MASTECTOMY WITH AXILLARY SENTINEL LYMPH NODE BIOPSY Right 09/26/2016   Procedure: PARTIAL MASTECTOMY WITH AXILLARY SENTINEL LYMPH NODE BIOPSY;  Surgeon: Aviva Signs, MD;  Location: [facility] ORS;  Service: General;  Laterality: Right;  . PORTACATH PLACEMENT Left 10/26/2016   Procedure: INSERTION PORT-A-CATH;  Surgeon: Aviva Signs, MD;  Location: [facility] ORS;  Service: General;  Laterality: Left;     SOCIAL HISTORY:  Social History   Social History  . Marital status: Single    Spouse name: N/A  . Number of children: N/A  . Years of education: N/A   Occupational History  . Not on file.   Social History Main Topics  . Smoking status: Never Smoker  . Smokeless tobacco: Never Used  . Alcohol use No  . Drug use: No  . Sexual activity: Not on file   Other Topics Concern  . Not on file   Social History Narrative  . No narrative on file    FAMILY HISTORY:  Family History  Problem Relation Age of Onset  . Arthritis    . Hypertension Mother   . Hypertension Father   . Colon cancer Neg Hx     CURRENT MEDICATIONS:  Outpatient Encounter Prescriptions as of 01/04/2017  Medication Sig  . acetaminophen (TYLENOL) 650 MG CR tablet Take 650 mg by mouth every 8 (eight) hours as needed for pain.  Marland Kitchen amLODipine (NORVASC) 5 MG tablet Take 5 mg by mouth every morning.  . Artificial Tear Solution (SOOTHE XP) SOLN Apply 2 drops to eye 3 (three) times daily as needed (for dry/irritated eyes).   . CYCLOPHOSPHAMIDE IV Inject into the vein. Every 2 weeks  . diphenhydramine-acetaminophen (TYLENOL PM) 25-500 MG TABS tablet Take 1 tablet by mouth at bedtime as needed (for pain.).   Marland Kitchen DOXOrubicin HCl (ADRIAMYCIN IV) Inject into the vein. Every 2 weeks  . lidocaine-prilocaine (EMLA) cream   . lisinopril-hydrochlorothiazide (PRINZIDE,ZESTORETIC)  20-12.5 MG tablet Take 1 tablet by mouth every morning.  Marland Kitchen omeprazole (PRILOSEC) 20 MG capsule Take 20 mg by mouth every morning.   . ondansetron (ZOFRAN) 8 MG tablet   . Pegfilgrastim (NEULASTA ONPRO [place]) Inject into the skin. Every 2 weeks  . potassium chloride SA (K-DUR,KLOR-CON) 20 MEQ tablet TAKE ONE TABLET BY MOUTH DAILY.  . sodium chloride (OCEAN) 0.65 % SOLN nasal spray Place 1 spray into both nostrils 4 (four) times daily as needed (for dry/congestion/allergies.).   No facility-administered encounter medications on file as of 01/04/2017.     ALLERGIES:  Allergies  Allergen Reactions  . Lipitor [Atorvastatin Calcium] Other (See Comments)    REACTION: Cramping in legs/lower extremties     PHYSICAL EXAM:  ECOG  Performance status: ***  There were no vitals filed for this visit. There were no vitals filed for this visit.  Physical Exam   LABORATORY DATA:  I have reviewed the labs as listed.  CBC    Component Value Date/Time   WBC 15.9 (H) 12/28/2016 1128   RBC 3.44 (L) 12/28/2016 1128   HGB 10.4 (L) 12/28/2016 1128   HCT 31.5 (L) 12/28/2016 1128   PLT 216 12/28/2016 1128   MCV 91.6 12/28/2016 1128   MCH 30.2 12/28/2016 1128   MCHC 33.0 12/28/2016 1128   RDW 17.5 (H) 12/28/2016 1128   LYMPHSABS 1.6 12/28/2016 1128   MONOABS 1.4 (H) 12/28/2016 1128   EOSABS 0.0 12/28/2016 1128   BASOSABS 0.2 (H) 12/28/2016 1128   CMP Latest Ref Rng & Units 12/28/2016 12/14/2016 11/30/2016  Glucose 65 - 99 mg/dL 93 97 104(H)  BUN 6 - 20 mg/dL _0 Creatinine 0.44 - 1.00 mg/dL 0.52 0.59 0.56  Sodium 135 - 145 mmol/L 140 139 142  Potassium 3.5 - 5.1 mmol/L 3.1(L) 3.2(L) 3.3(L)  Chloride 101 - 111 mmol/L 103 102 105  CO2 22 - 32 mmol/L _1 Calcium 8.9 - 10.3 mg/dL 9.4 9.1 9.2  Total Protein 6.5 - 8.1 g/dL 6.7 7.0 7.0  Total Bilirubin 0.3 - 1.2 mg/dL 0.4 0.3 0.5  Alkaline Phos 38 - 126 U/L 101 98 91  AST 15 - 41 U/L _2 ALT 14 - 54 U/L 12(L) 12(L) 14    PENDING LABS:      DIAGNOSTIC IMAGING:    PATHOLOGY:  Surgical path: 09/26/16      ASSESSMENT & PLAN:  Ms. Delgrande is a pleasant 61 y.o. female with Stage IA invasive ductal carcinoma of right breast, ER-/PR-/HER2-, diagnosed in 08/2016; s/p lumpectomy with SLNB with Dr. Arnoldo Morale. Now undergoing adjuvant chemotherapy-she has completed Adriamycin/Cytoxan x 4 cycles; currently on weekly Taxol.   Stage IA right breast cancer:  -Labs reviewed; proceed with cycle #2 Taxol today.  Plan will be to complete total of 12 cycles Taxol, then proceed to breast radiation therapy.  -She will be attending our [facility] next week to meet with several supportive care specialists (PT/OT, nutrition, social work, speech therapy, etc).  -      Dispo:  -Return to cancer center next week for consideration of cycle #3 Taxol.    All questions were answered to patient's stated satisfaction. Encouraged patient to call with any new concerns or questions before her next visit to the cancer center and we can certain see her sooner, if needed.       Mike Craze, NP [place] [phone]

## 2017-01-04 NOTE — Patient Instructions (Signed)
[facility] Discharge Instructions for Patients Receiving Chemotherapy   Beginning January 23rd 2017 lab work for the Cancer Center will be done in the  Main lab at [place] on 1st floor. If you have a lab appointment with the Cancer Center please come in thru the  Main Entrance and check in at the main information desk   Today you received the following chemotherapy agents Taxol. Follow-up as scheduled. Call clinic for any questions or concerns  To help prevent nausea and vomiting after your treatment, we encourage you to take your nausea medication   If you develop nausea and vomiting, or diarrhea that is not controlled by your medication, call the clinic.  The clinic phone number is [phone]. Office hours are Monday-Friday 8:30am-5:00pm.  BELOW ARE SYMPTOMS THAT SHOULD BE REPORTED IMMEDIATELY:  *FEVER GREATER THAN 101.0 F  *CHILLS WITH OR WITHOUT FEVER  NAUSEA AND VOMITING THAT IS NOT CONTROLLED WITH YOUR NAUSEA MEDICATION  *UNUSUAL SHORTNESS OF BREATH  *UNUSUAL BRUISING OR BLEEDING  TENDERNESS IN MOUTH AND THROAT WITH OR WITHOUT PRESENCE OF ULCERS  *URINARY PROBLEMS  *BOWEL PROBLEMS  UNUSUAL RASH Items with * indicate a potential emergency and should be followed up as soon as possible. If you have an emergency after office hours please contact your primary care physician or go to the nearest emergency department.  Please call the clinic during office hours if you have any questions or concerns.   You may also contact the Patient Navigator at [phone] should you have any questions or need assistance in obtaining follow up care.      Resources For Cancer Patients and their Caregivers ? American Cancer Society: Can assist with transportation, wigs, general needs, runs Look Good Feel Better.        1-888-227-6333 ? Cancer Care: Provides financial assistance, online support groups, medication/co-pay assistance.  1-800-813-HOPE  (4673) ? [facility] Assists [place] cancer patients and their families through emotional , educational and financial support.  336-427-4357 ? [place] DSS Where to apply for food stamps, Medicaid and utility assistance. 336-342-1394 ? [facility]: Transportation to medical appointments. 336-347-2287 ? Social Security Administration: May apply for disability if have a Stage IV cancer. 336-342-7796 1-800-772-1213 ? [place] Aging, Disability and Transit Services: Assists with nutrition, care and transit needs. 336-349-2343         

## 2017-01-04 NOTE — Progress Notes (Signed)
Kimberly Roman tolerated chemo tx well without complaints or incident. VSS upon discharge. Pt discharged self ambulatory in satisfactory condition

## 2017-01-07 ENCOUNTER — Telehealth (HOSPITAL_COMMUNITY): Payer: Self-pay | Admitting: Emergency Medicine

## 2017-01-07 NOTE — Telephone Encounter (Signed)
Called to remind pt of the [facility] clinic tomorrow at 9am and it will be on the 2nd floor.

## 2017-01-08 ENCOUNTER — Encounter: Payer: Self-pay | Admitting: Dietician

## 2017-01-08 ENCOUNTER — Encounter (HOSPITAL_BASED_OUTPATIENT_CLINIC_OR_DEPARTMENT_OTHER): Payer: BLUE CROSS/BLUE SHIELD

## 2017-01-08 ENCOUNTER — Encounter: Payer: Self-pay | Admitting: *Deleted

## 2017-01-08 ENCOUNTER — Ambulatory Visit (HOSPITAL_COMMUNITY): Payer: BLUE CROSS/BLUE SHIELD | Attending: Internal Medicine

## 2017-01-08 DIAGNOSIS — C50411 Malignant neoplasm of upper-outer quadrant of right female breast: Secondary | ICD-10-CM

## 2017-01-08 DIAGNOSIS — C50919 Malignant neoplasm of unspecified site of unspecified female breast: Secondary | ICD-10-CM

## 2017-01-08 DIAGNOSIS — Z171 Estrogen receptor negative status [ER-]: Secondary | ICD-10-CM | POA: Insufficient documentation

## 2017-01-08 NOTE — Progress Notes (Signed)
[facility] Psychosocial Distress Screening Clinical Social Work  Clinical Social Work met with pt at [facility] to review distress screening protocol and to reassess needs.  The patient scored a 5 on the Psychosocial Distress Thermometer which indicates moderate distress. Clinical Social Worker has continued to help pt with financial concerns and she was approved for assistance through [place] in [place]. She is in the process of applying for help through [place] and several others. Pt stated her biggest issue is not having any hair currently. Pt has received a wig and is adjusting, but looking forward to her hair regrowing. Pt is considering attending Support group and Support Programs for additional support. Pt has positive coping mechanisms and copes through reading, doing puzzles, and watching tv. CSW will continue to follow and assist pt with assistance applications.   ONCBCN DISTRESS SCREENING 01/08/2017  Screening Type Other (comment)  Distress experienced in past week (1-10) 5  Practical problem type   Emotional problem type Adjusting to appearance changes  Referral to clinical social work Yes  Referral to support programs Yes     Clinical Social Worker follow up needed: Yes.    If yes, follow up plan: See above Loren Racer, [place] Tuesdays   [phone]

## 2017-01-08 NOTE — Progress Notes (Signed)
Pt in [facility] clinic from 9-11 am.  Pt was seen by SW, RD, and OT.

## 2017-01-08 NOTE — Progress Notes (Signed)
Patient is seen as part of High Risk Clinic   Wt Readings from Last 10 Encounters:  01/04/17 148 lb (67.1 kg)  12/28/16 144 lb 11.2 oz (65.6 kg)  12/14/16 146 lb (66.2 kg)  11/30/16 146 lb 12.8 oz (66.6 kg)  11/16/16 148 lb (67.1 kg)  11/09/16 148 lb (67.1 kg)  11/02/16 148 lb 9.6 oz (67.4 kg)  10/26/16 150 lb (68 kg)  10/22/16 149 lb (67.6 kg)  10/11/16 151 lb 6.4 oz (68.7 kg)   Patient weight has been stable since treatment began late Fall 2017  Patient reports oral intake as good/unchanged and is suffering from symptoms including taste changes.  Patient says there are certain items that taste like "salty metal". She says she can no longer drink faucet water for this reason and now relies on bottles water. She also has noticed this taste with ketchup. Surprisingly, the taste changes have worked in her favor with eggs; she used to not like these at all, but now thinks they taste good.   RD went through a dietary recall. She is eating 3 meals a day and is eating a protein source at all meals. She sounds to be consuming adequate fluids.   She denies any n/v/c/d.   She had a couple dietary related questions, mostly pertaining to sodium.   Pt seems to be adequately nourished and eating an appropriate diet with the correct behaviors.   She is told that she can reach out to RD at any point with nutrition related questions.    Burtis Junes RD, LDN, [facility] Pager: [phone] 01/08/2017 12:01 PM

## 2017-01-08 NOTE — Therapy (Signed)
[place], [place], [address] Phone: [phone]   Fax:  [phone]  Patient Details  Name: Kimberly Roman MRN: [id number] Date of Birth: 1956-11-08 Referring Provider:  Rosita Fire, MD  Encounter Date: 01/08/2017 Patient was presented with insurance benefits prior to beginning OT evaluation for multidisciplinary cancer clinic. Patient declined OT services today and wished to not have her insurance billed. Therapist understood and provided patient with lymphedema handout. Briefly provided patient with ways to reduce risk of lymphedema. Patient was provided with therapist's name and number and encouraged to call or speak to her doctor if she had any additional questions.    Ailene Ravel, OTR/L,CBIS  [phone]  01/08/2017, 10:12 AM  [place], [place], [address] Phone: [phone]   Fax:  [phone]

## 2017-01-11 ENCOUNTER — Encounter (HOSPITAL_BASED_OUTPATIENT_CLINIC_OR_DEPARTMENT_OTHER): Payer: BLUE CROSS/BLUE SHIELD | Admitting: Oncology

## 2017-01-11 ENCOUNTER — Encounter (HOSPITAL_BASED_OUTPATIENT_CLINIC_OR_DEPARTMENT_OTHER): Payer: BLUE CROSS/BLUE SHIELD

## 2017-01-11 VITALS — BP 160/69 | HR 88 | Temp 98.0°F | Resp 20 | Wt 146.0 lb

## 2017-01-11 VITALS — BP 113/63 | HR 70 | Temp 97.8°F | Resp 16

## 2017-01-11 DIAGNOSIS — Z171 Estrogen receptor negative status [ER-]: Secondary | ICD-10-CM

## 2017-01-11 DIAGNOSIS — E876 Hypokalemia: Secondary | ICD-10-CM

## 2017-01-11 DIAGNOSIS — C50911 Malignant neoplasm of unspecified site of right female breast: Secondary | ICD-10-CM

## 2017-01-11 DIAGNOSIS — Z5111 Encounter for antineoplastic chemotherapy: Secondary | ICD-10-CM | POA: Diagnosis not present

## 2017-01-11 DIAGNOSIS — C50411 Malignant neoplasm of upper-outer quadrant of right female breast: Secondary | ICD-10-CM

## 2017-01-11 LAB — CBC WITH DIFFERENTIAL/PLATELET
Basophils Absolute: 0 10*3/uL (ref 0.0–0.1)
Basophils Relative: 1 %
Eosinophils Absolute: 0 10*3/uL (ref 0.0–0.7)
Eosinophils Relative: 1 %
HCT: 30.6 % — ABNORMAL LOW (ref 36.0–46.0)
Hemoglobin: 10.1 g/dL — ABNORMAL LOW (ref 12.0–15.0)
Lymphocytes Relative: 15 %
Lymphs Abs: 0.6 10*3/uL — ABNORMAL LOW (ref 0.7–4.0)
MCH: 30.3 pg (ref 26.0–34.0)
MCHC: 33 g/dL (ref 30.0–36.0)
MCV: 91.9 fL (ref 78.0–100.0)
Monocytes Absolute: 0.5 10*3/uL (ref 0.1–1.0)
Monocytes Relative: 13 %
Neutro Abs: 2.9 10*3/uL (ref 1.7–7.7)
Neutrophils Relative %: 70 %
Platelets: 351 10*3/uL (ref 150–400)
RBC: 3.33 MIL/uL — ABNORMAL LOW (ref 3.87–5.11)
RDW: 18.1 % — ABNORMAL HIGH (ref 11.5–15.5)
WBC: 4.2 10*3/uL (ref 4.0–10.5)

## 2017-01-11 LAB — COMPREHENSIVE METABOLIC PANEL
ALT: 20 U/L (ref 14–54)
AST: 19 U/L (ref 15–41)
Albumin: 4 g/dL (ref 3.5–5.0)
Alkaline Phosphatase: 73 U/L (ref 38–126)
Anion gap: 7 (ref 5–15)
BUN: 9 mg/dL (ref 6–20)
CO2: 29 mmol/L (ref 22–32)
Calcium: 9.5 mg/dL (ref 8.9–10.3)
Chloride: 103 mmol/L (ref 101–111)
Creatinine, Ser: 0.48 mg/dL (ref 0.44–1.00)
GFR calc Af Amer: >60 mL/min (ref >60)
GFR calc non Af Amer: >60 mL/min (ref >60)
Glucose, Bld: 94 mg/dL (ref 65–99)
Potassium: 3.5 mmol/L (ref 3.5–5.1)
Sodium: 139 mmol/L (ref 135–145)
Total Bilirubin: 0.5 mg/dL (ref 0.3–1.2)
Total Protein: 6.7 g/dL (ref 6.5–8.1)

## 2017-01-11 MED ORDER — DIPHENHYDRAMINE HCL 50 MG/ML IJ SOLN
INTRAMUSCULAR | Status: AC
  Filled 2017-01-11: qty 1

## 2017-01-11 MED ORDER — SODIUM CHLORIDE 0.9 % IV SOLN: Freq: Once | INTRAVENOUS | Status: DC

## 2017-01-11 MED ORDER — SODIUM CHLORIDE 0.9 % IV SOLN
Freq: Once | INTRAVENOUS | Status: AC
  Administered 2017-01-11: 11:00:00 via INTRAVENOUS

## 2017-01-11 MED ORDER — SODIUM CHLORIDE 0.9 % IV SOLN
20.0000 mg | Freq: Once | INTRAVENOUS | Status: AC
  Administered 2017-01-11: 20 mg via INTRAVENOUS
  Filled 2017-01-11: qty 2

## 2017-01-11 MED ORDER — DIPHENHYDRAMINE HCL 50 MG/ML IJ SOLN
50.0000 mg | Freq: Once | INTRAMUSCULAR | Status: AC
  Administered 2017-01-11: 50 mg via INTRAVENOUS

## 2017-01-11 MED ORDER — FAMOTIDINE IN NACL 20-0.9 MG/50ML-% IV SOLN
INTRAVENOUS | Status: AC
  Filled 2017-01-11: qty 50

## 2017-01-11 MED ORDER — DEXTROSE 5 % IV SOLN
80.0000 mg/m2 | Freq: Once | INTRAVENOUS | Status: AC
  Administered 2017-01-11: 138 mg via INTRAVENOUS
  Filled 2017-01-11: qty 23

## 2017-01-11 MED ORDER — ONDANSETRON 8 MG PO TBDP
8.0000 mg | ORAL_TABLET | Freq: Once | ORAL | Status: AC
  Administered 2017-01-11: 8 mg via ORAL
  Filled 2017-01-11: qty 1

## 2017-01-11 MED ORDER — FAMOTIDINE IN NACL 20-0.9 MG/50ML-% IV SOLN
20.0000 mg | Freq: Once | INTRAVENOUS | Status: AC
  Administered 2017-01-11: 20 mg via INTRAVENOUS

## 2017-01-11 MED ORDER — POTASSIUM CHLORIDE CRYS ER 20 MEQ PO TBCR: 40.0000 meq | EXTENDED_RELEASE_TABLET | Freq: Every day | ORAL | 0 refills | Status: DC

## 2017-01-11 MED ORDER — SODIUM CHLORIDE 0.9% FLUSH: 10.0000 mL | INTRAVENOUS | Status: DC | PRN

## 2017-01-11 MED ORDER — HEPARIN SOD (PORK) LOCK FLUSH 100 UNIT/ML IV SOLN
500.0000 [IU] | Freq: Once | INTRAVENOUS | Status: AC | PRN
  Administered 2017-01-11: 500 [IU]
  Filled 2017-01-11: qty 5

## 2017-01-11 NOTE — Patient Instructions (Addendum)
[facility] at [facility] Discharge Instructions  RECOMMENDATIONS MADE BY THE CONSULTANT AND ANY TEST RESULTS WILL BE SENT TO YOUR REFERRING PHYSICIAN.  Treatment today  Treatment next week  Treatment in 2 weeks & follow up with Tom/MD in 2 weeks   Thank you for choosing [place] at [facility] to provide your oncology and hematology care.  To afford each patient quality time with our provider, please arrive at least 15 minutes before your scheduled appointment time.    If you have a lab appointment with the [facility] please come in thru the  Main Entrance and check in at the main information desk  You need to re-schedule your appointment should you arrive 10 or more minutes late.  We strive to give you quality time with our providers, and arriving late affects you and other patients whose appointments are after yours.  Also, if you no show three or more times for appointments you may be dismissed from the clinic at the providers discretion.     Again, thank you for choosing [facility].  Our hope is that these requests will decrease the amount of time that you wait before being seen by our physicians.       _____________________________________________________________  Should you have questions after your visit to [facility], please contact our office at [phone] between the hours of 8:30 a.m. and 4:30 p.m.  Voicemails left after 4:30 p.m. will not be returned until the following business day.  For prescription refill requests, have your pharmacy contact our office.       Resources For Cancer Patients and their Caregivers ? American Cancer Society: Can assist with transportation, wigs, general needs, runs Look Good Feel Better.        567 426 4269 ? Cancer Care: Provides financial assistance, online support groups, medication/co-pay assistance.  1-800-813-HOPE 914 327 8178) ? [facility] Assists [place] cancer patients and their families through emotional , educational and financial support.  902-843-3794 ? [place] DSS Where to apply for food stamps, Medicaid and utility assistance. 339-089-4542 ? RCATS: Transportation to medical appointments. 838-476-9271 ? Social Security Administration: May apply for disability if have a Stage IV cancer. [phone] 619-830-3951 ? [employer], Disability and Transit Services: Assists with nutrition, care and transit needs. [place] Support Programs: @10RELATIVEDAYS @ > Cancer Support Group  2nd Tuesday of the month 1pm-2pm, Journey Room  > Creative Journey  3rd Tuesday of the month 1130am-1pm, Journey Room  > Look Good Feel Better  1st Wednesday of the month 10am-12 noon, Journey Room (Call [facility] to register [phone])

## 2017-01-11 NOTE — Assessment & Plan Note (Addendum)
Stage IA (T1cN0M0) invasive ductal carcinoma of right breast, upper outer quadrant, HER2 NEGATIVE, ER/PR NEGATIVE.  S/P 4 cycles of AC systemic chemotherapy with Neulasta support and now on weekly T x 12.  Oncology history is updated.  Pre-treatment labs today: CBC diff, CMET.  I personally reviewed and went over laboratory results with the patient.  The results are noted within this dictation. Labs satisfy treatment parameters.  I have refilled her potassium pills at home.  This is escribed.  Return as scheduled for weekly Paclitaxel and follow-up.

## 2017-01-11 NOTE — Patient Instructions (Signed)
[facility] Discharge Instructions for Patients Receiving Chemotherapy   Beginning January 23rd 2017 lab work for the Cancer Center will be done in the  Main lab at  on 1st floor. If you have a lab appointment with the Cancer Center please come in thru the  Main Entrance and check in at the main information desk   Today you received the following chemotherapy agent: Taxol.     If you develop nausea and vomiting, or diarrhea that is not controlled by your medication, call the clinic.  The clinic phone number is [phone]. Office hours are Monday-Friday 8:30am-5:00pm.  BELOW ARE SYMPTOMS THAT SHOULD BE REPORTED IMMEDIATELY:  *FEVER GREATER THAN 101.0 F  *CHILLS WITH OR WITHOUT FEVER  NAUSEA AND VOMITING THAT IS NOT CONTROLLED WITH YOUR NAUSEA MEDICATION  *UNUSUAL SHORTNESS OF BREATH  *UNUSUAL BRUISING OR BLEEDING  TENDERNESS IN MOUTH AND THROAT WITH OR WITHOUT PRESENCE OF ULCERS  *URINARY PROBLEMS  *BOWEL PROBLEMS  UNUSUAL RASH Items with * indicate a potential emergency and should be followed up as soon as possible. If you have an emergency after office hours please contact your primary care physician or go to the nearest emergency department.  Please call the clinic during office hours if you have any questions or concerns.   You may also contact the Patient Navigator at [phone] should you have any questions or need assistance in obtaining follow up care.      Resources For Cancer Patients and their Caregivers ? American Cancer Society: Can assist with transportation, wigs, general needs, runs Look Good Feel Better.        1-888-227-6333 ? Cancer Care: Provides financial assistance, online support groups, medication/co-pay assistance.  1-800-813-HOPE (4673) ? [facility] Assists [place] cancer patients and their families through emotional , educational and financial support.   336-427-4357 ? [place] DSS Where to apply for food stamps, Medicaid and utility assistance. 336-342-1394 ? RCATS: Transportation to medical appointments. 336-347-2287 ? Social Security Administration: May apply for disability if have a Stage IV cancer. [phone] 1-800-772-1213 ? [place] [facility], Disability and Transit Services: Assists with nutrition, care and transit needs. [phone]          

## 2017-01-11 NOTE — Progress Notes (Signed)
[name], [place] [address]  Infiltrating ductal carcinoma of right breast ([place])  Hypokalemia - Plan: potassium chloride SA (K-DUR,KLOR-CON) 20 MEQ tablet  CURRENT THERAPY: AC +T beginning on 11/02/2016  INTERVAL HISTORY: Kimberly Roman 61 y.o. female returns for followup of Stage IA (T1cN0M0) invasive ductal carcinoma of right breast, upper outer quadrant, HER2 NEGATIVE, ER/PR NEGATIVE.    Invasive ductal carcinoma of breast ([place])   08/13/2016 Mammogram    BI-RADS CATEGORY  0: Incomplete.      08/21/2016 Mammogram    BI-RADS CATEGORY  5: Highly suggestive of malignancy. Suspicious 1.2 cm mass 10 o'clock position right breast.      08/21/2016 Breast US    Targeted ultrasound is performed, showing a hypoechoic irregular mass with indistinct margins at 10 o'clock position 9 cm from the nipple measuring approximately 1.1 x 1.2 x 1.0 cm. No additional masses are seen in this region of the right breast on ultrasound.      08/28/2016 Procedure    Right needle core biopsy      09/26/2016 Procedure    Right upper outer quadrant lumpectomy by Dr. Arnoldo Morale      09/28/2016 Pathology Results    Invasive ductal carcinoma, grade 3, standing 1.7 cm. High-grade ductal carcinoma in situ with comedonecrosis. Invasive carcinoma in less than 0.1 cm of the superior medial margin focally. In situ carcinoma is less than 0.1 cm of superior medial margin and the posterior margin focally. No LVI. ER negative. PR negative. HER-2 negative. KI-67 90%.      09/28/2016 Cancer Staging    Invasive ductal carcinoma of breast [facility])   Staging form: Breast, AJCC 7th Edition   - Clinical stage from 09/28/2016: Stage IA (T1c, N0, M0) - Signed by B[facility] Cancer, PA-C on 10/22/2016      09/29/2016 Pathology Results    Invasive ductal carcinoma, grade 2/3. HER-2 negative. ER negative. PR negative. Ki-67 90%.      10/24/2016 Echocardiogram    - Left ventricle:  The cavity size was normal. Wall thickness was   normal. Systolic function was normal. The estimated ejection   fraction was in the range of 60% to 65%. Wall motion was normal;   there were no regional wall motion abnormalities.       10/26/2016 Procedure    Port placed placed by Dr. Arnoldo Morale      11/02/2016 -  Chemotherapy    The patient had DOXOrubicin (ADRIAMYCIN) chemo injection 106 mg, 60 mg/m2 = 106 mg, Intravenous,  Once, 4 of 4 cycles  palonosetron (ALOXI) injection 0.25 mg, 0.25 mg, Intravenous,  Once, 4 of 4 cycles  pegfilgrastim (NEULASTA ONPRO KIT) injection 6 mg, 6 mg, Subcutaneous, Once, 4 of 4 cycles  cyclophosphamide (CYTOXAN) 1,060 mg in sodium chloride 0.9 % 250 mL chemo infusion, 600 mg/m2 = 1,060 mg, Intravenous,  Once, 4 of 4 cycles  fosaprepitant (EMEND) 150 mg, dexamethasone (DECADRON) 12 mg in sodium chloride 0.9 % 145 mL IVPB, , Intravenous,  Once, 4 of 4 cycles  PACLitaxel (TAXOL) 138 mg in dextrose 5 % 250 mL chemo infusion ( ondansetron (ZOFRAN) 8 mg in sodium chloride 0.9 % 50 mL IVPB, , Intravenous,  Once, 2 of 12 cycles  for chemotherapy treatment.         She is doing well and tolerating treatment well.  She has noted darkening of her fingernails which she was educated about prior to paclitaxel chemotherapy.  She denies any pain in her fingertips.  She denies any nausea or vomiting.  Her appetite is fair.  Weight is very stable.  She notes a low back pain that radiates up her back in between the shoulder blades.  She notes that it comes and goes.  She notes that over-the-counter Tylenol resolves his pain.  She denies any issues with range of motion of her back.  Straight leg test bilaterally is negative.  She is looking forward to completing chemotherapy so her hair will begin to come back.  Review of Systems  Constitutional: Negative.  Negative for chills, fever and weight loss.  HENT: Negative.   Respiratory: Negative.  Negative for cough, sputum  production and shortness of breath.   Cardiovascular: Negative.  Negative for chest pain.  Gastrointestinal: Negative.  Negative for abdominal pain, constipation, diarrhea, nausea and vomiting.  Genitourinary: Negative.   Musculoskeletal: Positive for back pain (low back pain). Negative for falls, joint pain and myalgias.  Skin: Negative.  Negative for itching and rash.       Darkening of fingernails.  Neurological: Negative.  Negative for weakness.  Endo/Heme/Allergies: Negative.   Psychiatric/Behavioral: Negative.     Past Medical History:  Diagnosis Date  . Ankylosing spondylitis ([place])   . Arthritis   . Breast cancer ([place])   . Carpal tunnel syndrome   . Environmental allergies   . GERD (gastroesophageal reflux disease)   . Hyperlipemia   . Hypertension   . Invasive ductal carcinoma of breast ([place]) 10/22/2016    Past Surgical History:  Procedure Laterality Date  . ABDOMINAL HYSTERECTOMY    . CARPAL TUNNEL RELEASE Right 04/27/2013   Procedure: RIGHT CARPAL TUNNEL RELEASE;  Surgeon: Carole Civil, MD;  Location: [facility] ORS;  Service: Orthopedics;  Laterality: Right;  . COLONOSCOPY N/A 01/20/2013   Procedure: COLONOSCOPY;  Surgeon: Danie Binder, MD;  Location: [facility] ENDO SUITE;  Service: Endoscopy;  Laterality: N/A;  9:30 AM  . HEMATOMA EVACUATION Right 10/26/2016   Procedure: EVACUATION HEMATOMA;  Surgeon: Aviva Signs, MD;  Location: [facility] ORS;  Service: General;  Laterality: Right;  . PARTIAL MASTECTOMY WITH AXILLARY SENTINEL LYMPH NODE BIOPSY Right 09/26/2016   Procedure: PARTIAL MASTECTOMY WITH AXILLARY SENTINEL LYMPH NODE BIOPSY;  Surgeon: Aviva Signs, MD;  Location: [facility] ORS;  Service: General;  Laterality: Right;  . PORTACATH PLACEMENT Left 10/26/2016   Procedure: INSERTION PORT-A-CATH;  Surgeon: Aviva Signs, MD;  Location: [facility] ORS;  Service: General;  Laterality: Left;    Family History  Problem Relation Age of Onset  . Arthritis    . Hypertension Mother   . Hypertension  Father   . Colon cancer Neg Hx     Social History   Social History  . Marital status: Single    Spouse name: N/A  . Number of children: N/A  . Years of education: N/A   Social History Main Topics  . Smoking status: Never Smoker  . Smokeless tobacco: Never Used  . Alcohol use No  . Drug use: No  . Sexual activity: Not on file   Other Topics Concern  . Not on file   Social History Narrative  . No narrative on file     PHYSICAL EXAMINATION  ECOG PERFORMANCE STATUS: 1 - Symptomatic but completely ambulatory  Vitals:   01/11/17 1018  BP: (!) 160/69  Pulse: 88  Resp: 20  Temp: 98 F (36.7 C)     GENERAL:alert, no distress, well nourished, well developed, comfortable, cooperative, smiling,  accompanied by her mother. SKIN: skin color, texture, turgor are normal, no rashes.  HEAD: Normocephalic, No masses, lesions, tenderness or abnormalities EYES: normal, EOMI, Conjunctiva are pink.   EARS: External ears normal OROPHARYNX:lips, buccal mucosa, and tongue normal and mucous membranes are moist  NECK: supple, trachea midline LYMPH:  not examined BREAST:not examined LUNGS: clear to auscultation without wheezes, rales, or rhonchi  HEART: regular rate & rhythm without murmur, rub, or gallop. ABDOMEN:abdomen soft and normal bowel sounds BACK: Back symmetric, no curvature.   EXTREMITIES:less then 2 second capillary refill, no joint deformities, effusion, or inflammation, no skin discoloration, no cyanosis.  Darkening of fingernails is appreciated bilaterally. NEURO: alert & oriented x 3 with fluent speech, no focal motor/sensory deficits, gait normal   LABORATORY DATA: CBC    Component Value Date/Time   WBC 4.2 01/11/2017 1015   RBC 3.33 (L) 01/11/2017 1015   HGB 10.1 (L) 01/11/2017 1015   HCT 30.6 (L) 01/11/2017 1015   PLT 351 01/11/2017 1015   MCV 91.9 01/11/2017 1015   MCH 30.3 01/11/2017 1015   MCHC 33.0 01/11/2017 1015   RDW 18.1 (H) 01/11/2017 1015    LYMPHSABS 0.6 (L) 01/11/2017 1015   MONOABS 0.5 01/11/2017 1015   EOSABS 0.0 01/11/2017 1015   BASOSABS 0.0 01/11/2017 1015      Chemistry      Component Value Date/Time   NA 141 01/04/2017 1040   K 3.2 (L) 01/04/2017 1040   CL 104 01/04/2017 1040   CO2 29 01/04/2017 1040   BUN 11 01/04/2017 1040   CREATININE 0.53 01/04/2017 1040      Component Value Date/Time   CALCIUM 9.2 01/04/2017 1040   ALKPHOS 72 01/04/2017 1040   AST 17 01/04/2017 1040   ALT 15 01/04/2017 1040   BILITOT 0.5 01/04/2017 1040        PENDING LABS:   RADIOGRAPHIC STUDIES:  No results found.   PATHOLOGY:    ASSESSMENT AND PLAN:  Invasive ductal carcinoma of breast ([place]) Stage IA (T1cN0M0) invasive ductal carcinoma of right breast, upper outer quadrant, HER2 NEGATIVE, ER/PR NEGATIVE.  S/P 4 cycles of AC systemic chemotherapy with Neulasta support and now on weekly T x 12.  Oncology history is updated.  Pre-treatment labs today: CBC diff, CMET.  I personally reviewed and went over laboratory results with the patient.  The results are noted within this dictation. Labs satisfy treatment parameters.  I have refilled her potassium pills at home.  This is escribed.  Return as scheduled for weekly Paclitaxel and follow-up.   ORDERS PLACED FOR THIS ENCOUNTER: No orders of the defined types were placed in this encounter.   MEDICATIONS PRESCRIBED THIS ENCOUNTER: Meds ordered this encounter  Medications  . potassium chloride SA (K-DUR,KLOR-CON) 20 MEQ tablet    Sig: Take 2 tablets (40 mEq total) by mouth daily.    Dispense:  60 tablet    Refill:  0    Order Specific Question:   Supervising Provider    Answer:   Brunetta Genera [[id number]]    THERAPY PLAN:  12 weekly cycles of T, after completing 4 cycles of AC.  This will be followed by XRT.  Given her negative hormone status, anti-endocrine therapy following XRT is not indicated.  All questions were answered. The patient knows to call the  clinic with any problems, questions or concerns. We can certainly see the patient much sooner if necessary.  Patient and plan discussed with Dr. Twana First and she is  in agreement with the aforementioned.   This note is electronically signed by: Doy Mince 01/11/2017 10:51 AM

## 2017-01-11 NOTE — Progress Notes (Signed)
Patient tolerated infusion well.  VSS.  Patient ambulatory and stable upon discharge from clinic.   

## 2017-01-18 ENCOUNTER — Encounter (HOSPITAL_COMMUNITY): Payer: Self-pay | Admitting: Adult Health

## 2017-01-18 ENCOUNTER — Ambulatory Visit (HOSPITAL_COMMUNITY): Payer: BLUE CROSS/BLUE SHIELD

## 2017-01-18 ENCOUNTER — Encounter (HOSPITAL_BASED_OUTPATIENT_CLINIC_OR_DEPARTMENT_OTHER): Payer: BLUE CROSS/BLUE SHIELD | Admitting: Adult Health

## 2017-01-18 ENCOUNTER — Encounter (HOSPITAL_BASED_OUTPATIENT_CLINIC_OR_DEPARTMENT_OTHER): Payer: BLUE CROSS/BLUE SHIELD

## 2017-01-18 VITALS — BP 121/59 | HR 67 | Temp 98.1°F | Resp 18 | Wt 148.5 lb

## 2017-01-18 DIAGNOSIS — C50411 Malignant neoplasm of upper-outer quadrant of right female breast: Secondary | ICD-10-CM

## 2017-01-18 DIAGNOSIS — G620 Drug-induced polyneuropathy: Secondary | ICD-10-CM | POA: Diagnosis not present

## 2017-01-18 DIAGNOSIS — Z5111 Encounter for antineoplastic chemotherapy: Secondary | ICD-10-CM | POA: Diagnosis not present

## 2017-01-18 DIAGNOSIS — C50911 Malignant neoplasm of unspecified site of right female breast: Secondary | ICD-10-CM

## 2017-01-18 DIAGNOSIS — Z171 Estrogen receptor negative status [ER-]: Secondary | ICD-10-CM | POA: Diagnosis not present

## 2017-01-18 LAB — COMPREHENSIVE METABOLIC PANEL
ALT: 19 U/L (ref 14–54)
AST: 20 U/L (ref 15–41)
Albumin: 4.1 g/dL (ref 3.5–5.0)
Alkaline Phosphatase: 86 U/L (ref 38–126)
Anion gap: 6 (ref 5–15)
BUN: 7 mg/dL (ref 6–20)
CO2: 30 mmol/L (ref 22–32)
Calcium: 9.6 mg/dL (ref 8.9–10.3)
Chloride: 103 mmol/L (ref 101–111)
Creatinine, Ser: 0.54 mg/dL (ref 0.44–1.00)
GFR calc Af Amer: >60 mL/min (ref >60)
GFR calc non Af Amer: >60 mL/min (ref >60)
Glucose, Bld: 90 mg/dL (ref 65–99)
Potassium: 3.6 mmol/L (ref 3.5–5.1)
Sodium: 139 mmol/L (ref 135–145)
Total Bilirubin: 0.4 mg/dL (ref 0.3–1.2)
Total Protein: 6.8 g/dL (ref 6.5–8.1)

## 2017-01-18 LAB — CBC WITH DIFFERENTIAL/PLATELET
Basophils Absolute: 0.1 10*3/uL (ref 0.0–0.1)
Basophils Relative: 1 %
Eosinophils Absolute: 0.1 10*3/uL (ref 0.0–0.7)
Eosinophils Relative: 1 %
HCT: 31.4 % — ABNORMAL LOW (ref 36.0–46.0)
Hemoglobin: 10 g/dL — ABNORMAL LOW (ref 12.0–15.0)
Lymphocytes Relative: 14 %
Lymphs Abs: 0.8 10*3/uL (ref 0.7–4.0)
MCH: 30.1 pg (ref 26.0–34.0)
MCHC: 31.8 g/dL (ref 30.0–36.0)
MCV: 94.6 fL (ref 78.0–100.0)
Monocytes Absolute: 0.6 10*3/uL (ref 0.1–1.0)
Monocytes Relative: 10 %
Neutro Abs: 4.3 10*3/uL (ref 1.7–7.7)
Neutrophils Relative %: 74 %
Platelets: 269 10*3/uL (ref 150–400)
RBC: 3.32 MIL/uL — ABNORMAL LOW (ref 3.87–5.11)
RDW: 18.2 % — ABNORMAL HIGH (ref 11.5–15.5)
WBC: 5.8 10*3/uL (ref 4.0–10.5)

## 2017-01-18 MED ORDER — HEPARIN SOD (PORK) LOCK FLUSH 100 UNIT/ML IV SOLN
500.0000 [IU] | Freq: Once | INTRAVENOUS | Status: AC | PRN
Start: 2017-01-18 — End: 2017-01-18
  Administered 2017-01-18: 500 [IU]
  Filled 2017-01-18: qty 5

## 2017-01-18 MED ORDER — DIPHENHYDRAMINE HCL 50 MG/ML IJ SOLN
50.0000 mg | Freq: Once | INTRAMUSCULAR | Status: AC
  Administered 2017-01-18: 50 mg via INTRAVENOUS

## 2017-01-18 MED ORDER — PACLITAXEL CHEMO INJECTION 300 MG/50ML
80.0000 mg/m2 | Freq: Once | INTRAVENOUS | Status: AC
  Administered 2017-01-18: 138 mg via INTRAVENOUS
  Filled 2017-01-18: qty 23

## 2017-01-18 MED ORDER — SODIUM CHLORIDE 0.9% FLUSH
10.0000 mL | INTRAVENOUS | Status: DC | PRN
  Administered 2017-01-18: 10 mL
  Filled 2017-01-18: qty 10

## 2017-01-18 MED ORDER — DIPHENHYDRAMINE HCL 50 MG/ML IJ SOLN
INTRAMUSCULAR | Status: AC
  Filled 2017-01-18: qty 1

## 2017-01-18 MED ORDER — FAMOTIDINE IN NACL 20-0.9 MG/50ML-% IV SOLN
20.0000 mg | Freq: Once | INTRAVENOUS | Status: AC
  Administered 2017-01-18: 20 mg via INTRAVENOUS

## 2017-01-18 MED ORDER — SODIUM CHLORIDE 0.9 % IV SOLN
Freq: Once | INTRAVENOUS | Status: AC
  Administered 2017-01-18: 12:00:00 via INTRAVENOUS
  Filled 2017-01-18: qty 4

## 2017-01-18 MED ORDER — FAMOTIDINE IN NACL 20-0.9 MG/50ML-% IV SOLN
INTRAVENOUS | Status: AC
  Filled 2017-01-18: qty 50

## 2017-01-18 MED ORDER — SODIUM CHLORIDE 0.9 % IV SOLN
Freq: Once | INTRAVENOUS | Status: AC
  Administered 2017-01-18: 12:00:00 via INTRAVENOUS

## 2017-01-18 NOTE — Patient Instructions (Addendum)
[facility] at [facility] Discharge Instructions  RECOMMENDATIONS MADE BY THE CONSULTANT AND ANY TEST RESULTS WILL BE SENT TO YOUR REFERRING PHYSICIAN.  Exam with Mike Craze, NP. Labs and treatment today. Return to the clinic next week for treatment and for follow up with the provider. Please see Amy as you leave for appointments.    Thank you for choosing [place] at [facility] to provide your oncology and hematology care.  To afford each patient quality time with our provider, please arrive at least 15 minutes before your scheduled appointment time.    If you have a lab appointment with the [facility] please come in thru the  Main Entrance and check in at the main information desk  You need to re-schedule your appointment should you arrive 10 or more minutes late.  We strive to give you quality time with our providers, and arriving late affects you and other patients whose appointments are after yours.  Also, if you no show three or more times for appointments you may be dismissed from the clinic at the providers discretion.     Again, thank you for choosing [facility].  Our hope is that these requests will decrease the amount of time that you wait before being seen by our physicians.       _____________________________________________________________  Should you have questions after your visit to [facility], please contact our office at [phone] between the hours of 8:30 a.m. and 4:30 p.m.  Voicemails left after 4:30 p.m. will not be returned until the following business day.  For prescription refill requests, have your pharmacy contact our office.       Resources For Cancer Patients and their Caregivers ? American Cancer Society: Can assist with transportation, wigs, general needs, runs Look Good Feel Better.        ([phone] ? Cancer Care: Provides financial assistance, online support  groups, medication/co-pay assistance.  1-800-813-HOPE (934)211-6328) ? [facility] Assists [place] cancer patients and their families through emotional , educational and financial support.  [phone] ? [place] DSS Where to apply for food stamps, Medicaid and utility assistance. (262) [phone] ? [employer]: Transportation to medical appointments. 810-343-7853 ? Social Security Administration: May apply for disability if have a Stage IV cancer. 587-695-8874 628-734-2192 ? [employer], Disability and Transit Services: Assists with nutrition, care and transit needs. [place] Support Programs: @10RELATIVEDAYS @ > Cancer Support Group  2nd Tuesday of the month 1pm-2pm, Journey Room  > Creative Journey  3rd Tuesday of the month 1130am-1pm, Journey Room  > Look Good Feel Better  1st Wednesday of the month 10am-12 noon, Journey Room (Call [place] to register [phone])

## 2017-01-18 NOTE — Progress Notes (Signed)
[place], [place] [address]   CLINIC:  Medical Oncology/Hematology  PCP:  Rosita Fire, MD [place] [address] [phone]   REASON FOR VISIT:  Follow-up for Stage IA (T1cN0M0) invasive ductal carcinoma of right breast; ER-/PR-/HER2-  CURRENT THERAPY: Adjuvant chemotherapy s/p Adriamycin/Cytoxan x 4; now Taxol weekly    BRIEF ONCOLOGIC HISTORY:    Invasive ductal carcinoma of breast ([place])   08/13/2016 Mammogram    BI-RADS CATEGORY  0: Incomplete.      08/21/2016 Mammogram    BI-RADS CATEGORY  5: Highly suggestive of malignancy. Suspicious 1.2 cm mass 10 o'clock position right breast.      08/21/2016 Breast US    Targeted ultrasound is performed, showing a hypoechoic irregular mass with indistinct margins at 10 o'clock position 9 cm from the nipple measuring approximately 1.1 x 1.2 x 1.0 cm. No additional masses are seen in this region of the right breast on ultrasound.      08/28/2016 Procedure    Right needle core biopsy      09/26/2016 Procedure    Right upper outer quadrant lumpectomy by Dr. Arnoldo Morale      09/28/2016 Pathology Results    Invasive ductal carcinoma, grade 3, standing 1.7 cm. High-grade ductal carcinoma in situ with comedonecrosis. Invasive carcinoma in less than 0.1 cm of the superior medial margin focally. In situ carcinoma is less than 0.1 cm of superior medial margin and the posterior margin focally. No LVI. ER negative. PR negative. HER-2 negative. KI-67 90%.      09/28/2016 Cancer Staging    Invasive ductal carcinoma of breast [facility])   Staging form: Breast, AJCC 7th Edition   - Clinical stage from 09/28/2016: Stage IA (T1c, N0, M0) - Signed by Baird Cancer, PA-C on 10/22/2016      09/29/2016 Pathology Results    Invasive ductal carcinoma, grade 2/3. HER-2 negative. ER negative. PR negative. Ki-67 90%.      10/24/2016 Echocardiogram    - Left ventricle: The cavity size was  normal. Wall thickness was   normal. Systolic function was normal. The estimated ejection   fraction was in the range of 60% to 65%. Wall motion was normal;   there were no regional wall motion abnormalities.       10/26/2016 Procedure    Port placed placed by Dr. Arnoldo Morale      11/02/2016 -  Chemotherapy    The patient had DOXOrubicin (ADRIAMYCIN) chemo injection 106 mg, 60 mg/m2 = 106 mg, Intravenous,  Once, 4 of 4 cycles  palonosetron (ALOXI) injection 0.25 mg, 0.25 mg, Intravenous,  Once, 4 of 4 cycles  pegfilgrastim (NEULASTA ONPRO [place]) injection 6 mg, 6 mg, Subcutaneous, Once, 4 of 4 cycles  cyclophosphamide (CYTOXAN) 1,060 mg in sodium chloride 0.9 % 250 mL chemo infusion, 600 mg/m2 = 1,060 mg, Intravenous,  Once, 4 of 4 cycles  fosaprepitant (EMEND) 150 mg, dexamethasone (DECADRON) 12 mg in sodium chloride 0.9 % 145 mL IVPB, , Intravenous,  Once, 4 of 4 cycles  PACLitaxel (TAXOL) 138 mg in dextrose 5 % 250 mL chemo infusion ( ondansetron (ZOFRAN) 8 mg in sodium chloride 0.9 % 50 mL IVPB, , Intravenous,  Once, 2 of 12 cycles  for chemotherapy treatment.          INTERVAL HISTORY:  Kimberly Roman presents for routine follow-up and her next cycle of Taxol.    She is due for cycle #4 today.  She endorses  some peripheral neuropathy to her hands and feet. She is able to button her clothes, write with a pen, walk without falling, etc. The peripheral neuropathy is not affecting her ability to perform ADLs.    Her appetite is excellent. Energy levels are pretty good.  She has occasional back pain, which she attributes to arthritis; this pain is not new and pre-dates her cancer diagnosis.  Tylenol as needed helps alleviate the pain.  She reports occasional headaches as well.  She does not like that her fingernails and toenails are turning black from the chemotherapy, but otherwise is largely without complaints today.  She feels well and feels ready for her next cycle of chemotherapy.     REVIEW OF SYSTEMS:  Review of Systems  Constitutional: Negative for appetite change, chills and fever.  HENT:  Negative.  Negative for lump/mass and nosebleeds.   Eyes: Negative.   Respiratory: Negative.  Negative for cough and shortness of breath.   Cardiovascular: Negative.  Negative for chest pain and leg swelling.  Gastrointestinal: Negative.  Negative for abdominal pain, blood in stool, constipation, diarrhea, nausea and vomiting.  Endocrine: Negative.   Genitourinary: Negative.  Negative for dysuria and hematuria.   Musculoskeletal: Negative.  Negative for arthralgias.  Skin: Negative.  Negative for rash.       Nail darkening/changes since starting chemotherapy   Neurological: Positive for headaches and numbness (peripheral neuropathy to fingertips and toes ). Negative for dizziness.  Hematological: Negative.  Negative for adenopathy. Does not bruise/bleed easily.  Psychiatric/Behavioral: Negative.  Negative for depression and sleep disturbance. The patient is not nervous/anxious.      PAST MEDICAL/SURGICAL HISTORY:  Past Medical History:  Diagnosis Date  . Ankylosing spondylitis ([place])   . Arthritis   . Breast cancer ([place])   . Carpal tunnel syndrome   . Environmental allergies   . GERD (gastroesophageal reflux disease)   . Hyperlipemia   . Hypertension   . Invasive ductal carcinoma of breast ([place]) 10/22/2016   Past Surgical History:  Procedure Laterality Date  . ABDOMINAL HYSTERECTOMY    . CARPAL TUNNEL RELEASE Right 04/27/2013   Procedure: RIGHT CARPAL TUNNEL RELEASE;  Surgeon: Carole Civil, MD;  Location: [facility] ORS;  Service: Orthopedics;  Laterality: Right;  . COLONOSCOPY N/A 01/20/2013   Procedure: COLONOSCOPY;  Surgeon: Danie Binder, MD;  Location: [facility] ENDO SUITE;  Service: Endoscopy;  Laterality: N/A;  9:30 AM  . HEMATOMA EVACUATION Right 10/26/2016   Procedure: EVACUATION HEMATOMA;  Surgeon: Aviva Signs, MD;  Location: [facility] ORS;  Service: General;   Laterality: Right;  . PARTIAL MASTECTOMY WITH AXILLARY SENTINEL LYMPH NODE BIOPSY Right 09/26/2016   Procedure: PARTIAL MASTECTOMY WITH AXILLARY SENTINEL LYMPH NODE BIOPSY;  Surgeon: Aviva Signs, MD;  Location: [facility] ORS;  Service: General;  Laterality: Right;  . PORTACATH PLACEMENT Left 10/26/2016   Procedure: INSERTION PORT-A-CATH;  Surgeon: Aviva Signs, MD;  Location: [facility] ORS;  Service: General;  Laterality: Left;     SOCIAL HISTORY:  Social History   Social History  . Marital status: Single    Spouse name: N/A  . Number of children: N/A  . Years of education: N/A   Occupational History  . Not on file.   Social History Main Topics  . Smoking status: Never Smoker  . Smokeless tobacco: Never Used  . Alcohol use No  . Drug use: No  . Sexual activity: Not on file   Other Topics Concern  . Not on file   Social History  Narrative  . No narrative on file    FAMILY HISTORY:  Family History  Problem Relation Age of Onset  . Arthritis    . Hypertension Mother   . Hypertension Father   . Colon cancer Neg Hx     CURRENT MEDICATIONS:  Outpatient Encounter Prescriptions as of 01/18/2017  Medication Sig  . acetaminophen (TYLENOL) 650 MG CR tablet Take 650 mg by mouth every 8 (eight) hours as needed for pain.  Marland Kitchen amLODipine (NORVASC) 5 MG tablet Take 5 mg by mouth every morning.  . Artificial Tear Solution (SOOTHE XP) SOLN Apply 2 drops to eye 3 (three) times daily as needed (for dry/irritated eyes).   . CYCLOPHOSPHAMIDE IV Inject into the vein. Every 2 weeks  . diphenhydramine-acetaminophen (TYLENOL PM) 25-500 MG TABS tablet Take 1 tablet by mouth at bedtime as needed (for pain.).   Marland Kitchen DOXOrubicin HCl (ADRIAMYCIN IV) Inject into the vein. Every 2 weeks  . lidocaine-prilocaine (EMLA) cream   . lisinopril-hydrochlorothiazide (PRINZIDE,ZESTORETIC) 20-12.5 MG tablet Take 1 tablet by mouth every morning.  Marland Kitchen omeprazole (PRILOSEC) 20 MG capsule Take 20 mg by mouth every morning.   .  ondansetron (ZOFRAN) 8 MG tablet   . Pegfilgrastim (NEULASTA ONPRO [place]) Inject into the skin. Every 2 weeks  . potassium chloride SA (K-DUR,KLOR-CON) 20 MEQ tablet Take 2 tablets (40 mEq total) by mouth daily.  . sodium chloride (OCEAN) 0.65 % SOLN nasal spray Place 1 spray into both nostrils 4 (four) times daily as needed (for dry/congestion/allergies.).   No facility-administered encounter medications on file as of 01/18/2017.     ALLERGIES:  Allergies  Allergen Reactions  . Lipitor [Atorvastatin Calcium] Other (See Comments)    REACTION: Cramping in legs/lower extremties     PHYSICAL EXAM:  ECOG Performance status: 1 - Symptomatic, but independent.      Physical Exam  Constitutional: She is oriented to person, place, and time and well-developed, well-nourished, and in no distress.  HENT:  Head: Normocephalic.  Mouth/Throat: Oropharynx is clear and moist. No oropharyngeal exudate.  Eyes: Conjunctivae are normal. Pupils are equal, round, and reactive to light. No scleral icterus.  Neck: Normal range of motion. Neck supple.  Cardiovascular: Normal rate, regular rhythm and normal heart sounds.   Pulmonary/Chest: Effort normal and breath sounds normal. No respiratory distress.  Abdominal: Soft. Bowel sounds are normal. There is no tenderness.  Musculoskeletal: Normal range of motion. She exhibits no edema.  Lymphadenopathy:    She has no cervical adenopathy.       Right: No supraclavicular adenopathy present.       Left: No supraclavicular adenopathy present.  Neurological: She is alert and oriented to person, place, and time. No cranial nerve deficit. Gait normal.  Skin: Skin is warm and dry. No rash noted.  Nail darkening (expected finding with chemotherapy)   Psychiatric: Mood, memory, affect and judgment normal.  Nursing note and vitals reviewed.    LABORATORY DATA:  I have reviewed the labs as listed.  CBC    Component Value Date/Time   WBC 5.8 01/18/2017 1027    RBC 3.32 (L) 01/18/2017 1027   HGB 10.0 (L) 01/18/2017 1027   HCT 31.4 (L) 01/18/2017 1027   PLT 269 01/18/2017 1027   MCV 94.6 01/18/2017 1027   MCH 30.1 01/18/2017 1027   MCHC 31.8 01/18/2017 1027   RDW 18.2 (H) 01/18/2017 1027   LYMPHSABS 0.8 01/18/2017 1027   MONOABS 0.6 01/18/2017 1027   EOSABS 0.1 01/18/2017 1027  BASOSABS 0.1 01/18/2017 1027   CMP Latest Ref Rng & Units 01/18/2017 01/11/2017 01/04/2017  Glucose 65 - 99 mg/dL 90 94 99  BUN 6 - 20 mg/dL 7 9 11   Creatinine 0.44 - 1.00 mg/dL 0.54 0.48 0.53  Sodium 135 - 145 mmol/L 139 139 141  Potassium 3.5 - 5.1 mmol/L 3.6 3.5 3.2(L)  Chloride 101 - 111 mmol/L 103 103 104  CO2 22 - 32 mmol/L 30 29 29   Calcium 8.9 - 10.3 mg/dL 9.6 9.5 9.2  Total Protein 6.5 - 8.1 g/dL 6.8 6.7 6.4(L)  Total Bilirubin 0.3 - 1.2 mg/dL 0.4 0.5 0.5  Alkaline Phos 38 - 126 U/L 86 73 72  AST 15 - 41 U/L 20 19 17   ALT 14 - 54 U/L 19 20 15     PENDING LABS:    DIAGNOSTIC IMAGING:    PATHOLOGY:  Right breast lumpectomy surgical path: 09/26/16      ASSESSMENT & PLAN:   Stage IA (T1cN0M0) invasive ductal carcinoma of right breast; ER-/PR-/HER2-: -s/p lumpectomy and adjuvant chemotherapy with Adriamycin/Cytoxan x 4 cycles. Adjuvant chemo continues with Taxol weekly x 12 planned cycles.  -Labs reviewed and are adequate for cycle #4 Taxol today.  -She will return next week for her next cycle of chemotherapy.   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 She describes the neuropathy as "not too bad."  We discussed starting gabapentin to help manage her symptoms, but she does want to start any new medications at this time, which is reasonable.  We will certainly continue to monitor her neuropathy symptoms going forward with Taxol therapy.   Nail changes secondary to chemo:  -Provided support and reassurance that the nail darkening/changes are normal and expected. She is self-conscious that when  she is out shopping that people are staring at her hands wondering what is wrong with her.  I encouraged her to consider painting her nails fun colors to help disguise the nail darkening. She knows not to get professional manicure while undergoing chemotherapy d/t risk for infection, but she could have fun with different nail polish colors.    Dispo:  -Return to cancer center next week for next cycle Taxol.    All questions were answered to patient's stated satisfaction. Encouraged patient to call with any new concerns or questions before her next visit to the cancer center and we can certain see her sooner, if needed.      Orders placed this encounter:  No orders of the defined types were placed in this encounter.     Mike Craze, NP [place] [phone]

## 2017-01-18 NOTE — Patient Instructions (Signed)
[facility] Discharge Instructions for Patients Receiving Chemotherapy   Beginning January 23rd 2017 lab work for the Cancer Center will be done in the  Main lab at [facility] on 1st floor. If you have a lab appointment with the Cancer Center please come in thru the  Main Entrance and check in at the main information desk   Today you received the following chemotherapy agents Taxol. Follow-up as scheduled. Call clinic for any questions or concerns  To help prevent nausea and vomiting after your treatment, we encourage you to take your nausea medication   If you develop nausea and vomiting, or diarrhea that is not controlled by your medication, call the clinic.  The clinic phone number is [phone]. Office hours are Monday-Friday 8:30am-5:00pm.  BELOW ARE SYMPTOMS THAT SHOULD BE REPORTED IMMEDIATELY:  *FEVER GREATER THAN 101.0 F  *CHILLS WITH OR WITHOUT FEVER  NAUSEA AND VOMITING THAT IS NOT CONTROLLED WITH YOUR NAUSEA MEDICATION  *UNUSUAL SHORTNESS OF BREATH  *UNUSUAL BRUISING OR BLEEDING  TENDERNESS IN MOUTH AND THROAT WITH OR WITHOUT PRESENCE OF ULCERS  *URINARY PROBLEMS  *BOWEL PROBLEMS  UNUSUAL RASH Items with * indicate a potential emergency and should be followed up as soon as possible. If you have an emergency after office hours please contact your primary care physician or go to the nearest emergency department.  Please call the clinic during office hours if you have any questions or concerns.   You may also contact the Patient Navigator at [phone] should you have any questions or need assistance in obtaining follow up care.      Resources For Cancer Patients and their Caregivers ? American Cancer Society: Can assist with transportation, wigs, general needs, runs Look Good Feel Better.        1-888-227-6333 ? Cancer Care: Provides financial assistance, online support groups, medication/co-pay assistance.  1-800-813-HOPE  (4673) ? [facility] Assists [place] cancer patients and their families through emotional , educational and financial support.  336-427-4357 ? [place] DSS Where to apply for food stamps, Medicaid and utility assistance. 336-342-1394 ? RCATS: Transportation to medical appointments. 336-347-2287 ? Social Security Administration: May apply for disability if have a Stage IV cancer. 336-342-7796 1-800-772-1213 ? [place] Aging, Disability and Transit Services: Assists with nutrition, care and transit needs. 336-349-2343         

## 2017-01-18 NOTE — Progress Notes (Signed)
Kimberly Roman tolerated chemo tx well without complaints or incident. Labs reviewed prior to administering chemotherapy. VSS upon discharge. Pt discharged self ambulatory in satisfactory condition accompanied by her mother

## 2017-01-25 ENCOUNTER — Encounter (HOSPITAL_COMMUNITY): Payer: BLUE CROSS/BLUE SHIELD | Attending: Oncology

## 2017-01-25 ENCOUNTER — Encounter (HOSPITAL_COMMUNITY): Payer: Self-pay | Admitting: Oncology

## 2017-01-25 ENCOUNTER — Encounter (HOSPITAL_COMMUNITY): Payer: BLUE CROSS/BLUE SHIELD | Attending: Oncology | Admitting: Oncology

## 2017-01-25 VITALS — BP 125/58 | HR 70 | Temp 98.3°F | Resp 18

## 2017-01-25 DIAGNOSIS — C50411 Malignant neoplasm of upper-outer quadrant of right female breast: Secondary | ICD-10-CM | POA: Diagnosis not present

## 2017-01-25 DIAGNOSIS — G620 Drug-induced polyneuropathy: Secondary | ICD-10-CM

## 2017-01-25 DIAGNOSIS — Z171 Estrogen receptor negative status [ER-]: Secondary | ICD-10-CM | POA: Diagnosis not present

## 2017-01-25 DIAGNOSIS — C50911 Malignant neoplasm of unspecified site of right female breast: Secondary | ICD-10-CM | POA: Diagnosis present

## 2017-01-25 DIAGNOSIS — Z5111 Encounter for antineoplastic chemotherapy: Secondary | ICD-10-CM | POA: Diagnosis not present

## 2017-01-25 LAB — CBC WITH DIFFERENTIAL/PLATELET
Basophils Absolute: 0 10*3/uL (ref 0.0–0.1)
Basophils Relative: 1 %
Eosinophils Absolute: 0.1 10*3/uL (ref 0.0–0.7)
Eosinophils Relative: 2 %
HCT: 31.2 % — ABNORMAL LOW (ref 36.0–46.0)
Hemoglobin: 10.4 g/dL — ABNORMAL LOW (ref 12.0–15.0)
Lymphocytes Relative: 17 %
Lymphs Abs: 0.8 10*3/uL (ref 0.7–4.0)
MCH: 31 pg (ref 26.0–34.0)
MCHC: 33.3 g/dL (ref 30.0–36.0)
MCV: 92.9 fL (ref 78.0–100.0)
Monocytes Absolute: 0.3 10*3/uL (ref 0.1–1.0)
Monocytes Relative: 7 %
Neutro Abs: 3.2 10*3/uL (ref 1.7–7.7)
Neutrophils Relative %: 73 %
Platelets: 261 10*3/uL (ref 150–400)
RBC: 3.36 MIL/uL — ABNORMAL LOW (ref 3.87–5.11)
RDW: 18.2 % — ABNORMAL HIGH (ref 11.5–15.5)
WBC: 4.4 10*3/uL (ref 4.0–10.5)

## 2017-01-25 LAB — COMPREHENSIVE METABOLIC PANEL
ALT: 19 U/L (ref 14–54)
AST: 19 U/L (ref 15–41)
Albumin: 4 g/dL (ref 3.5–5.0)
Alkaline Phosphatase: 74 U/L (ref 38–126)
Anion gap: 6 (ref 5–15)
BUN: 9 mg/dL (ref 6–20)
CO2: 29 mmol/L (ref 22–32)
Calcium: 9.4 mg/dL (ref 8.9–10.3)
Chloride: 104 mmol/L (ref 101–111)
Creatinine, Ser: 0.49 mg/dL (ref 0.44–1.00)
GFR calc Af Amer: >60 mL/min (ref >60)
GFR calc non Af Amer: >60 mL/min (ref >60)
Glucose, Bld: 98 mg/dL (ref 65–99)
Potassium: 3.6 mmol/L (ref 3.5–5.1)
Sodium: 139 mmol/L (ref 135–145)
Total Bilirubin: 0.4 mg/dL (ref 0.3–1.2)
Total Protein: 6.9 g/dL (ref 6.5–8.1)

## 2017-01-25 MED ORDER — SODIUM CHLORIDE 0.9% FLUSH: 10.0000 mL | INTRAVENOUS | Status: DC | PRN

## 2017-01-25 MED ORDER — DIPHENHYDRAMINE HCL 50 MG/ML IJ SOLN
50.0000 mg | Freq: Once | INTRAMUSCULAR | Status: AC
  Administered 2017-01-25: 50 mg via INTRAVENOUS

## 2017-01-25 MED ORDER — PACLITAXEL CHEMO INJECTION 300 MG/50ML
80.0000 mg/m2 | Freq: Once | INTRAVENOUS | Status: AC
  Administered 2017-01-25: 138 mg via INTRAVENOUS
  Filled 2017-01-25: qty 23

## 2017-01-25 MED ORDER — FAMOTIDINE IN NACL 20-0.9 MG/50ML-% IV SOLN
INTRAVENOUS | Status: AC
  Filled 2017-01-25: qty 50

## 2017-01-25 MED ORDER — SODIUM CHLORIDE 0.9 % IV SOLN
Freq: Once | INTRAVENOUS | Status: AC
  Administered 2017-01-25: 11:00:00 via INTRAVENOUS

## 2017-01-25 MED ORDER — DIPHENHYDRAMINE HCL 50 MG/ML IJ SOLN
INTRAMUSCULAR | Status: AC
  Filled 2017-01-25: qty 1

## 2017-01-25 MED ORDER — HEPARIN SOD (PORK) LOCK FLUSH 100 UNIT/ML IV SOLN
500.0000 [IU] | Freq: Once | INTRAVENOUS | Status: AC | PRN
  Administered 2017-01-25: 500 [IU]

## 2017-01-25 MED ORDER — SODIUM CHLORIDE 0.9 % IV SOLN
Freq: Once | INTRAVENOUS | Status: AC
  Administered 2017-01-25: 12:00:00 via INTRAVENOUS
  Filled 2017-01-25: qty 4

## 2017-01-25 MED ORDER — FAMOTIDINE IN NACL 20-0.9 MG/50ML-% IV SOLN
20.0000 mg | Freq: Once | INTRAVENOUS | Status: AC
  Administered 2017-01-25: 20 mg via INTRAVENOUS

## 2017-01-25 NOTE — Patient Instructions (Signed)
[place] at [facility] Discharge Instructions  RECOMMENDATIONS MADE BY THE CONSULTANT AND ANY TEST RESULTS WILL BE SENT TO YOUR REFERRING PHYSICIAN.  You were seen today by [name] PA-C. Treatment today. Continue taking tylenol for back pain and head ache. Can try taking over the counter allergy medicine for nasal drainage. Return as scheduled for treatment and follow up.   Thank you for choosing [place] at [facility] to provide your oncology and hematology care.  To afford each patient quality time with our provider, please arrive at least 15 minutes before your scheduled appointment time.    If you have a lab appointment with the [facility] please come in thru the  Main Entrance and check in at the main information desk  You need to re-schedule your appointment should you arrive 10 or more minutes late.  We strive to give you quality time with our providers, and arriving late affects you and other patients whose appointments are after yours.  Also, if you no show three or more times for appointments you may be dismissed from the clinic at the providers discretion.     Again, thank you for choosing [facility].  Our hope is that these requests will decrease the amount of time that you wait before being seen by our physicians.       _____________________________________________________________  Should you have questions after your visit to [facility], please contact our office at [phone] between the hours of 8:30 a.m. and 4:30 p.m.  Voicemails left after 4:30 p.m. will not be returned until the following business day.  For prescription refill requests, have your pharmacy contact our office.       Resources For Cancer Patients and their Caregivers ? American Cancer Society: Can assist with transportation, wigs, general needs, runs Look Good Feel Better.        534-403-8068 ? Cancer  Care: Provides financial assistance, online support groups, medication/co-pay assistance.  1-800-813-HOPE 787-571-8159) ? [facility] Assists [place] cancer patients and their families through emotional , educational and financial support.  (902) 881-3694 ? [place] DSS Where to apply for food stamps, Medicaid and utility assistance. (862)603-3562 ? RCATS: Transportation to medical appointments. 308 250 9893 ? Social Security Administration: May apply for disability if have a Stage IV cancer. 708 798 4644 213-473-6003 ? [employer], Disability and Transit Services: Assists with nutrition, care and transit needs. [place] Support Programs: @10RELATIVEDAYS @ > Cancer Support Group  2nd Tuesday of the month 1pm-2pm, Journey Room  > Creative Journey  3rd Tuesday of the month 1130am-1pm, Journey Room  > Look Good Feel Better  1st Wednesday of the month 10am-12 noon, Journey Room (Call [facility] to register [phone])

## 2017-01-25 NOTE — Progress Notes (Signed)
Patient tolerated infusion well.  VSS.  Patient ambulatory and stable upon discharge from clinic.   

## 2017-01-25 NOTE — Assessment & Plan Note (Addendum)
Stage IA (T1cN0M0) invasive ductal carcinoma of right breast, upper outer quadrant, HER2 NEGATIVE, ER/PR NEGATIVE.  S/P 4 cycles of AC systemic chemotherapy with Neulasta support and now on weekly T x 12.  Oncology history is updated.  Pre-treatment labs today: CBC diff, CMET.  I personally reviewed and went over laboratory results with the patient.  The results are noted within this dictation. Labs satisfy treatment parameters.  She will continue with Tylenol for her back pain and headaches.  For her vasomotor nasal discharge, this is likely secondary to paclitaxel chemotherapy.  There may be a component of seasonal allergies as involved as well.  He can try over-the-counter allergy medicine such as Allegra, Claritin, Allavert, Zyrtec, etc. She is advised that if she tries Benadryl, she should take this at bedtime.  She is given some education regarding paclitaxel induced nail changes.  Her paclitaxel induced peripheral neuropathy resolves prior to each treatment.  Return as scheduled for weekly Paclitaxel and follow-up.

## 2017-01-25 NOTE — Patient Instructions (Signed)
[facility] Discharge Instructions for Patients Receiving Chemotherapy   Beginning January 23rd 2017 lab work for the Cancer Center will be done in the  Main lab at [facility] on 1st floor. If you have a lab appointment with the Cancer Center please come in thru the  Main Entrance and check in at the main information desk   Today you received the following chemotherapy agent: Taxol.     If you develop nausea and vomiting, or diarrhea that is not controlled by your medication, call the clinic.  The clinic phone number is [phone]. Office hours are Monday-Friday 8:30am-5:00pm.  BELOW ARE SYMPTOMS THAT SHOULD BE REPORTED IMMEDIATELY:  *FEVER GREATER THAN 101.0 F  *CHILLS WITH OR WITHOUT FEVER  NAUSEA AND VOMITING THAT IS NOT CONTROLLED WITH YOUR NAUSEA MEDICATION  *UNUSUAL SHORTNESS OF BREATH  *UNUSUAL BRUISING OR BLEEDING  TENDERNESS IN MOUTH AND THROAT WITH OR WITHOUT PRESENCE OF ULCERS  *URINARY PROBLEMS  *BOWEL PROBLEMS  UNUSUAL RASH Items with * indicate a potential emergency and should be followed up as soon as possible. If you have an emergency after office hours please contact your primary care physician or go to the nearest emergency department.  Please call the clinic during office hours if you have any questions or concerns.   You may also contact the Patient Navigator at [phone] should you have any questions or need assistance in obtaining follow up care.      Resources For Cancer Patients and their Caregivers ? American Cancer Society: Can assist with transportation, wigs, general needs, runs Look Good Feel Better.        1-888-227-6333 ? Cancer Care: Provides financial assistance, online support groups, medication/co-pay assistance.  1-800-813-HOPE (4673) ? [facility] Assists [place] cancer patients and their families through emotional , educational and financial support.   336-427-4357 ? [place] DSS Where to apply for food stamps, Medicaid and utility assistance. 336-342-1394 ? RCATS: Transportation to medical appointments. 336-347-2287 ? Social Security Administration: May apply for disability if have a Stage IV cancer. 336-342-7796 1-800-772-1213 ? [place] Aging, Disability and Transit Services: Assists with nutrition, care and transit needs. 336-349-2343          

## 2017-01-25 NOTE — Progress Notes (Signed)
[facility], MD [address] [place] [address]  Infiltrating ductal carcinoma of right breast [facility])  CURRENT THERAPY: AC +T beginning on 11/02/2016  INTERVAL HISTORY: Kimberly Roman 61 y.o. female returns for followup of Stage IA (T1cN0M0) invasive ductal carcinoma of right breast, upper outer quadrant, HER2 NEGATIVE, ER/PR NEGATIVE.    Invasive ductal carcinoma of breast ([place])   08/13/2016 Mammogram    BI-RADS CATEGORY  0: Incomplete.      08/21/2016 Mammogram    BI-RADS CATEGORY  5: Highly suggestive of malignancy. Suspicious 1.2 cm mass 10 o'clock position right breast.      08/21/2016 Breast US    Targeted ultrasound is performed, showing a hypoechoic irregular mass with indistinct margins at 10 o'clock position 9 cm from the nipple measuring approximately 1.1 x 1.2 x 1.0 cm. No additional masses are seen in this region of the right breast on ultrasound.      08/28/2016 Procedure    Right needle core biopsy      09/26/2016 Procedure    Right upper outer quadrant lumpectomy by Dr. Arnoldo Morale      09/28/2016 Pathology Results    Invasive ductal carcinoma, grade 3, standing 1.7 cm. High-grade ductal carcinoma in situ with comedonecrosis. Invasive carcinoma in less than 0.1 cm of the superior medial margin focally. In situ carcinoma is less than 0.1 cm of superior medial margin and the posterior margin focally. No LVI. ER negative. PR negative. HER-2 negative. KI-67 90%.      09/28/2016 Cancer Staging    Invasive ductal carcinoma of breast [facility])   Staging form: Breast, AJCC 7th Edition   - Clinical stage from 09/28/2016: Stage IA (T1c, N0, M0) - Signed by Baird Cancer, PA-C on 10/22/2016      09/29/2016 Pathology Results    Invasive ductal carcinoma, grade 2/3. HER-2 negative. ER negative. PR negative. Ki-67 90%.      10/24/2016 Echocardiogram    - Left ventricle: The cavity size was normal. Wall thickness was   normal. Systolic function  was normal. The estimated ejection   fraction was in the range of 60% to 65%. Wall motion was normal;   there were no regional wall motion abnormalities.       10/26/2016 Procedure    Port placed placed by Dr. Arnoldo Morale      11/02/2016 -  Chemotherapy    The patient had DOXOrubicin (ADRIAMYCIN) chemo injection 106 mg, 60 mg/m2 = 106 mg, Intravenous,  Once, 4 of 4 cycles  palonosetron (ALOXI) injection 0.25 mg, 0.25 mg, Intravenous,  Once, 4 of 4 cycles  pegfilgrastim (NEULASTA ONPRO KIT) injection 6 mg, 6 mg, Subcutaneous, Once, 4 of 4 cycles  cyclophosphamide (CYTOXAN) 1,060 mg in sodium chloride 0.9 % 250 mL chemo infusion, 600 mg/m2 = 1,060 mg, Intravenous,  Once, 4 of 4 cycles  fosaprepitant (EMEND) 150 mg, dexamethasone (DECADRON) 12 mg in sodium chloride 0.9 % 145 mL IVPB, , Intravenous,  Once, 4 of 4 cycles  PACLitaxel (TAXOL) 138 mg in dextrose 5 % 250 mL chemo infusion ( ondansetron (ZOFRAN) 8 mg in sodium chloride 0.9 % 50 mL IVPB, , Intravenous,  Once, 4 of 12 cycles  for chemotherapy treatment.         Realistically, she is tolerating chemotherapy well.  She denies any nausea or vomiting.  Her appetite is stable.  Her weight is stable.  She is experiencing darkening/hyperpigmentation of her nail beds proximally.  She notes  intermittent pain/discomfort associated with this as well.  She denies any discharge from under the nail.  She notes intermittent peripheral neuropathy.  This is resolved today and limited only to her fingertips.  She reports intermittent back pain.  She notes a pattern of this occurring following chemotherapy administration lasting for just a few days.  She notes it is resolved as of today.  She notes that it is well controlled with 1, maybe 2 Tylenol per day and rest.  She also notes intermittent headaches.  These 2 are controlled with Tylenol.  At this time, she does not need anything more potent.  She notes vasomotor nasal discharge.  She notes that  it is clear.  Review of Systems  Constitutional: Negative.  Negative for chills, fever and weight loss.  HENT: Negative.   Respiratory: Negative.  Negative for cough, sputum production and shortness of breath.   Cardiovascular: Negative.  Negative for chest pain.  Gastrointestinal: Negative.  Negative for abdominal pain, constipation, diarrhea, nausea and vomiting.  Genitourinary: Negative.   Musculoskeletal: Positive for back pain (low back pain). Negative for falls, joint pain and myalgias.  Skin: Negative.  Negative for itching and rash.       Darkening of fingernails.  Neurological: Positive for headaches (intermittent and resolves with tylenol). Negative for weakness.  Endo/Heme/Allergies: Negative.   Psychiatric/Behavioral: Negative.     Past Medical History:  Diagnosis Date  . Ankylosing spondylitis ([place])   . Arthritis   . Breast cancer ([place])   . Carpal tunnel syndrome   . Environmental allergies   . GERD (gastroesophageal reflux disease)   . Hyperlipemia   . Hypertension   . Invasive ductal carcinoma of breast ([place]) 10/22/2016    Past Surgical History:  Procedure Laterality Date  . ABDOMINAL HYSTERECTOMY    . CARPAL TUNNEL RELEASE Right 04/27/2013   Procedure: RIGHT CARPAL TUNNEL RELEASE;  Surgeon: Carole Civil, MD;  Location: [facility] ORS;  Service: Orthopedics;  Laterality: Right;  . COLONOSCOPY N/A 01/20/2013   Procedure: COLONOSCOPY;  Surgeon: Danie Binder, MD;  Location: [facility] ENDO SUITE;  Service: Endoscopy;  Laterality: N/A;  9:30 AM  . HEMATOMA EVACUATION Right 10/26/2016   Procedure: EVACUATION HEMATOMA;  Surgeon: Aviva Signs, MD;  Location: [facility] ORS;  Service: General;  Laterality: Right;  . PARTIAL MASTECTOMY WITH AXILLARY SENTINEL LYMPH NODE BIOPSY Right 09/26/2016   Procedure: PARTIAL MASTECTOMY WITH AXILLARY SENTINEL LYMPH NODE BIOPSY;  Surgeon: Aviva Signs, MD;  Location: [facility] ORS;  Service: General;  Laterality: Right;  . PORTACATH PLACEMENT Left 10/26/2016    Procedure: INSERTION PORT-A-CATH;  Surgeon: Aviva Signs, MD;  Location: [facility] ORS;  Service: General;  Laterality: Left;    Family History  Problem Relation Age of Onset  . Arthritis    . Hypertension Mother   . Hypertension Father   . Colon cancer Neg Hx     Social History   Social History  . Marital status: Single    Spouse name: N/A  . Number of children: N/A  . Years of education: N/A   Social History Main Topics  . Smoking status: Never Smoker  . Smokeless tobacco: Never Used  . Alcohol use No  . Drug use: No  . Sexual activity: Not Asked   Other Topics Concern  . None   Social History Narrative  . None     PHYSICAL EXAMINATION  ECOG PERFORMANCE STATUS: 1 - Symptomatic but completely ambulatory  Vitals:   01/25/17 1037  BP: 130/72  Pulse: 86  Resp: 18  Temp: 98.3 F (36.8 C)     GENERAL:alert, no distress, well nourished, well developed, comfortable, cooperative, smiling, accompanied by her mother. SKIN: skin color, texture, turgor are normal, no rashes.  HEAD: Normocephalic, No masses, lesions, tenderness or abnormalities EYES: normal, EOMI, Conjunctiva are pink.   EARS: External ears normal OROPHARYNX:lips, buccal mucosa, and tongue normal and mucous membranes are moist  NECK: supple, trachea midline LYMPH:  not examined BREAST:not examined LUNGS: clear to auscultation without wheezes, rales, or rhonchi  HEART: regular rate & rhythm without murmur, rub, or gallop. ABDOMEN:abdomen soft and normal bowel sounds BACK: Back symmetric, no curvature.   EXTREMITIES:less then 2 second capillary refill, no joint deformities, effusion, or inflammation, no skin discoloration, no cyanosis.  Darkening of fingernails is appreciated bilaterally. NEURO: alert & oriented x 3 with fluent speech, no focal motor/sensory deficits, gait normal   LABORATORY DATA: CBC    Component Value Date/Time   WBC 4.4 01/25/2017 1038   RBC 3.36 (L) 01/25/2017 1038   HGB 10.4 (L)  01/25/2017 1038   HCT 31.2 (L) 01/25/2017 1038   PLT 261 01/25/2017 1038   MCV 92.9 01/25/2017 1038   MCH 31.0 01/25/2017 1038   MCHC 33.3 01/25/2017 1038   RDW 18.2 (H) 01/25/2017 1038   LYMPHSABS 0.8 01/25/2017 1038   MONOABS 0.3 01/25/2017 1038   EOSABS 0.1 01/25/2017 1038   BASOSABS 0.0 01/25/2017 1038      Chemistry      Component Value Date/Time   NA 139 01/25/2017 1038   K 3.6 01/25/2017 1038   CL 104 01/25/2017 1038   CO2 29 01/25/2017 1038   BUN 9 01/25/2017 1038   CREATININE 0.49 01/25/2017 1038      Component Value Date/Time   CALCIUM 9.4 01/25/2017 1038   ALKPHOS 74 01/25/2017 1038   AST 19 01/25/2017 1038   ALT 19 01/25/2017 1038   BILITOT 0.4 01/25/2017 1038        PENDING LABS:   RADIOGRAPHIC STUDIES:  No results found.   PATHOLOGY:    ASSESSMENT AND PLAN:  Invasive ductal carcinoma of breast ([place]) Stage IA (T1cN0M0) invasive ductal carcinoma of right breast, upper outer quadrant, HER2 NEGATIVE, ER/PR NEGATIVE.  S/P 4 cycles of AC systemic chemotherapy with Neulasta support and now on weekly T x 12.  Oncology history is updated.  Pre-treatment labs today: CBC diff, CMET.  I personally reviewed and went over laboratory results with the patient.  The results are noted within this dictation. Labs satisfy treatment parameters.  She will continue with Tylenol for her back pain and headaches.  For her vasomotor nasal discharge, this is likely secondary to paclitaxel chemotherapy.  There may be a component of seasonal allergies as involved as well.  He can try over-the-counter allergy medicine such as Allegra, Claritin, Allavert, Zyrtec, etc. She is advised that if she tries Benadryl, she should take this at bedtime.  She is given some education regarding paclitaxel induced nail changes.  Her paclitaxel induced peripheral neuropathy resolves prior to each treatment.  Return as scheduled for weekly Paclitaxel and follow-up.   ORDERS PLACED FOR  THIS ENCOUNTER: No orders of the defined types were placed in this encounter.   MEDICATIONS PRESCRIBED THIS ENCOUNTER: No orders of the defined types were placed in this encounter.   THERAPY PLAN:  12 weekly cycles of T, after completing 4 cycles of AC.  This will be followed by XRT.  Given her negative hormone status,  anti-endocrine therapy following XRT is not indicated.  All questions were answered. The patient knows to call the clinic with any problems, questions or concerns. We can certainly see the patient much sooner if necessary.  Patient and plan discussed with Dr. Twana First and she is in agreement with the aforementioned.   This note is electronically signed by: Doy Mince 01/25/2017 4:25 PM

## 2017-02-01 ENCOUNTER — Encounter (HOSPITAL_COMMUNITY): Payer: BLUE CROSS/BLUE SHIELD | Attending: Oncology

## 2017-02-01 ENCOUNTER — Encounter (HOSPITAL_COMMUNITY): Payer: Self-pay

## 2017-02-01 ENCOUNTER — Encounter (HOSPITAL_COMMUNITY): Payer: BLUE CROSS/BLUE SHIELD | Attending: Oncology | Admitting: Oncology

## 2017-02-01 VITALS — BP 116/58 | HR 78 | Temp 98.2°F | Resp 16

## 2017-02-01 VITALS — BP 128/65 | HR 89 | Temp 98.3°F | Resp 18 | Wt 150.2 lb

## 2017-02-01 DIAGNOSIS — Z171 Estrogen receptor negative status [ER-]: Secondary | ICD-10-CM

## 2017-02-01 DIAGNOSIS — C50411 Malignant neoplasm of upper-outer quadrant of right female breast: Secondary | ICD-10-CM | POA: Diagnosis not present

## 2017-02-01 DIAGNOSIS — Z5111 Encounter for antineoplastic chemotherapy: Secondary | ICD-10-CM | POA: Diagnosis not present

## 2017-02-01 DIAGNOSIS — C50911 Malignant neoplasm of unspecified site of right female breast: Secondary | ICD-10-CM | POA: Diagnosis not present

## 2017-02-01 DIAGNOSIS — E876 Hypokalemia: Secondary | ICD-10-CM

## 2017-02-01 LAB — COMPREHENSIVE METABOLIC PANEL
ALT: 19 U/L (ref 14–54)
AST: 18 U/L (ref 15–41)
Albumin: 4 g/dL (ref 3.5–5.0)
Alkaline Phosphatase: 77 U/L (ref 38–126)
Anion gap: 5 (ref 5–15)
BUN: 10 mg/dL (ref 6–20)
CO2: 29 mmol/L (ref 22–32)
Calcium: 9.4 mg/dL (ref 8.9–10.3)
Chloride: 105 mmol/L (ref 101–111)
Creatinine, Ser: 0.47 mg/dL (ref 0.44–1.00)
GFR calc Af Amer: >60 mL/min (ref >60)
GFR calc non Af Amer: >60 mL/min (ref >60)
Glucose, Bld: 98 mg/dL (ref 65–99)
Potassium: 3.6 mmol/L (ref 3.5–5.1)
Sodium: 139 mmol/L (ref 135–145)
Total Bilirubin: 0.4 mg/dL (ref 0.3–1.2)
Total Protein: 6.7 g/dL (ref 6.5–8.1)

## 2017-02-01 LAB — CBC WITH DIFFERENTIAL/PLATELET
Basophils Absolute: 0 10*3/uL (ref 0.0–0.1)
Basophils Relative: 1 %
Eosinophils Absolute: 0.1 10*3/uL (ref 0.0–0.7)
Eosinophils Relative: 2 %
HCT: 31.3 % — ABNORMAL LOW (ref 36.0–46.0)
Hemoglobin: 10.3 g/dL — ABNORMAL LOW (ref 12.0–15.0)
Lymphocytes Relative: 15 %
Lymphs Abs: 0.8 10*3/uL (ref 0.7–4.0)
MCH: 30.8 pg (ref 26.0–34.0)
MCHC: 32.9 g/dL (ref 30.0–36.0)
MCV: 93.7 fL (ref 78.0–100.0)
Monocytes Absolute: 0.4 10*3/uL (ref 0.1–1.0)
Monocytes Relative: 8 %
Neutro Abs: 3.9 10*3/uL (ref 1.7–7.7)
Neutrophils Relative %: 74 %
Platelets: 259 10*3/uL (ref 150–400)
RBC: 3.34 MIL/uL — ABNORMAL LOW (ref 3.87–5.11)
RDW: 18.2 % — ABNORMAL HIGH (ref 11.5–15.5)
WBC: 5.2 10*3/uL (ref 4.0–10.5)

## 2017-02-01 MED ORDER — SODIUM CHLORIDE 0.9% FLUSH
10.0000 mL | INTRAVENOUS | Status: DC | PRN
  Administered 2017-02-01: 10 mL
  Filled 2017-02-01: qty 10

## 2017-02-01 MED ORDER — HEPARIN SOD (PORK) LOCK FLUSH 100 UNIT/ML IV SOLN
500.0000 [IU] | Freq: Once | INTRAVENOUS | Status: AC | PRN
  Administered 2017-02-01: 500 [IU]
  Filled 2017-02-01: qty 5

## 2017-02-01 MED ORDER — DEXTROSE 5 % IV SOLN
80.0000 mg/m2 | Freq: Once | INTRAVENOUS | Status: AC
  Administered 2017-02-01: 138 mg via INTRAVENOUS
  Filled 2017-02-01: qty 23

## 2017-02-01 MED ORDER — FAMOTIDINE IN NACL 20-0.9 MG/50ML-% IV SOLN
20.0000 mg | Freq: Once | INTRAVENOUS | Status: AC
  Administered 2017-02-01: 20 mg via INTRAVENOUS
  Filled 2017-02-01: qty 50

## 2017-02-01 MED ORDER — SODIUM CHLORIDE 0.9 % IV SOLN
Freq: Once | INTRAVENOUS | Status: AC
  Administered 2017-02-01: 12:00:00 via INTRAVENOUS
  Filled 2017-02-01: qty 4

## 2017-02-01 MED ORDER — POTASSIUM CHLORIDE CRYS ER 20 MEQ PO TBCR: 40.0000 meq | EXTENDED_RELEASE_TABLET | Freq: Every day | ORAL | 0 refills | Status: DC

## 2017-02-01 MED ORDER — DIPHENHYDRAMINE HCL 50 MG/ML IJ SOLN
50.0000 mg | Freq: Once | INTRAMUSCULAR | Status: AC
  Administered 2017-02-01: 50 mg via INTRAVENOUS
  Filled 2017-02-01: qty 1

## 2017-02-01 MED ORDER — SODIUM CHLORIDE 0.9 % IV SOLN
Freq: Once | INTRAVENOUS | Status: AC
  Administered 2017-02-01: 12:00:00 via INTRAVENOUS

## 2017-02-01 NOTE — Patient Instructions (Addendum)
[facility] at [facility] Discharge Instructions  RECOMMENDATIONS MADE BY THE CONSULTANT AND ANY TEST RESULTS WILL BE SENT TO YOUR REFERRING PHYSICIAN.  You were seen today by Dr. Louise Zhou Follow up in 3 weeks See Amy up front for appointments '  Thank you for choosing [facility] at [facility] to provide your oncology and hematology care.  To afford each patient quality time with our provider, please arrive at least 15 minutes before your scheduled appointment time.    If you have a lab appointment with the Cancer Center please come in thru the  Main Entrance and check in at the main information desk  You need to re-schedule your appointment should you arrive 10 or more minutes late.  We strive to give you quality time with our providers, and arriving late affects you and other patients whose appointments are after yours.  Also, if you no show three or more times for appointments you may be dismissed from the clinic at the providers discretion.     Again, thank you for choosing [facility].  Our hope is that these requests will decrease the amount of time that you wait before being seen by our physicians.       _____________________________________________________________  Should you have questions after your visit to [facility], please contact our office at [phone] between the hours of 8:30 a.m. and 4:30 p.m.  Voicemails left after 4:30 p.m. will not be returned until the following business day.  For prescription refill requests, have your pharmacy contact our office.       Resources For Cancer Patients and their Caregivers ? American Cancer Society: Can assist with transportation, wigs, general needs, runs Look Good Feel Better.        1-888-227-6333 ? Cancer Care: Provides financial assistance, online support groups, medication/co-pay assistance.  1-800-813-HOPE (4673) ? [facility] Assists [place] cancer patients and their families through emotional , educational and financial support.  336-427-4357 ? [place] DSS Where to apply for food stamps, Medicaid and utility assistance. 336-342-1394 ? [facility]: Transportation to medical appointments. 336-347-2287 ? Social Security Administration: May apply for disability if have a Stage IV cancer. 336-342-7796 1-800-772-1213 ? [place] Aging, Disability and Transit Services: Assists with nutrition, care and transit needs. 336-349-2343  Cancer Center Support Programs: @[email or web address]@ > Cancer Support Group  2nd Tuesday of the month 1pm-2pm, Journey Room  > Creative Journey  3rd Tuesday of the month 1130am-1pm, Journey Room  > Look Good Feel Better  1st Wednesday of the month 10am-12 noon, Journey Room (Call American Cancer Society to register 1-800-395-5775)    

## 2017-02-01 NOTE — Progress Notes (Signed)
Chemotherapy given today per orders. Patient tolerated it well, no problems. Vitals stable and discharged home from clinic ambulatory . Follow up as scheduled.

## 2017-02-01 NOTE — Patient Instructions (Signed)
[facility] Discharge Instructions for Patients Receiving Chemotherapy   Beginning January 23rd 2017 lab work for the Cancer Center will be done in the  Main lab at  on 1st floor. If you have a lab appointment with the Cancer Center please come in thru the  Main Entrance and check in at the main information desk   Today you received the following chemotherapy agents   To help prevent nausea and vomiting after your treatment, we encourage you to take your nausea medication     If you develop nausea and vomiting, or diarrhea that is not controlled by your medication, call the clinic.  The clinic phone number is [phone]. Office hours are Monday-Friday 8:30am-5:00pm.  BELOW ARE SYMPTOMS THAT SHOULD BE REPORTED IMMEDIATELY:  *FEVER GREATER THAN 101.0 F  *CHILLS WITH OR WITHOUT FEVER  NAUSEA AND VOMITING THAT IS NOT CONTROLLED WITH YOUR NAUSEA MEDICATION  *UNUSUAL SHORTNESS OF BREATH  *UNUSUAL BRUISING OR BLEEDING  TENDERNESS IN MOUTH AND THROAT WITH OR WITHOUT PRESENCE OF ULCERS  *URINARY PROBLEMS  *BOWEL PROBLEMS  UNUSUAL RASH Items with * indicate a potential emergency and should be followed up as soon as possible. If you have an emergency after office hours please contact your primary care physician or go to the nearest emergency department.  Please call the clinic during office hours if you have any questions or concerns.   You may also contact the Patient Navigator at [phone] should you have any questions or need assistance in obtaining follow up care.      Resources For Cancer Patients and their Caregivers ? American Cancer Society: Can assist with transportation, wigs, general needs, runs Look Good Feel Better.        1-888-227-6333 ? Cancer Care: Provides financial assistance, online support groups, medication/co-pay assistance.  1-800-813-HOPE (4673) ? [facility] Assists [place] cancer  patients and their families through emotional , educational and financial support.  336-427-4357 ? [place] DSS Where to apply for food stamps, Medicaid and utility assistance. 336-342-1394 ? [facility]: Transportation to medical appointments. 336-347-2287 ? Social Security Administration: May apply for disability if have a Stage IV cancer. 336-342-7796 1-800-772-1213 ? [place] Aging, Disability and Transit Services: Assists with nutrition, care and transit needs. 336-349-2343         

## 2017-02-01 NOTE — Progress Notes (Signed)
[facility]  PROGRESS NOTE  Patient Care Team: Rosita Fire, MD as PCP - General (Internal Medicine)  CHIEF COMPLAINTS/PURPOSE OF CONSULTATION:  Invasive breast cancer   Invasive ductal carcinoma of breast ([place])   08/13/2016 Mammogram    BI-RADS CATEGORY  0: Incomplete.      08/21/2016 Mammogram    BI-RADS CATEGORY  5: Highly suggestive of malignancy. Suspicious 1.2 cm mass 10 o'clock position right breast.      08/21/2016 Breast US    Targeted ultrasound is performed, showing a hypoechoic irregular mass with indistinct margins at 10 o'clock position 9 cm from the nipple measuring approximately 1.1 x 1.2 x 1.0 cm. No additional masses are seen in this region of the right breast on ultrasound.      08/28/2016 Procedure    Right needle core biopsy      09/26/2016 Procedure    Right upper outer quadrant lumpectomy by Dr. Arnoldo Morale      09/28/2016 Pathology Results    Invasive ductal carcinoma, grade 3, standing 1.7 cm. High-grade ductal carcinoma in situ with comedonecrosis. Invasive carcinoma in less than 0.1 cm of the superior medial margin focally. In situ carcinoma is less than 0.1 cm of superior medial margin and the posterior margin focally. No LVI. ER negative. PR negative. HER-2 negative. KI-67 90%.      09/28/2016 Cancer Staging    Invasive ductal carcinoma of breast [facility])   Staging form: Breast, AJCC 7th Edition   - Clinical stage from 09/28/2016: Stage IA (T1c, N0, M0) - Signed by Baird Cancer, PA-C on 10/22/2016      09/29/2016 Pathology Results    Invasive ductal carcinoma, grade 2/3. HER-2 negative. ER negative. PR negative. Ki-67 90%.      10/24/2016 Echocardiogram    - Left ventricle: The cavity size was normal. Wall thickness was   normal. Systolic function was normal. The estimated ejection   fraction was in the range of 60% to 65%. Wall motion was normal;   there were no regional wall motion abnormalities.       10/26/2016 Procedure   Port placed placed by Dr. Arnoldo Morale      11/02/2016 -  Chemotherapy    The patient had DOXOrubicin (ADRIAMYCIN) chemo injection 106 mg, 60 mg/m2 = 106 mg, Intravenous,  Once, 4 of 4 cycles  palonosetron (ALOXI) injection 0.25 mg, 0.25 mg, Intravenous,  Once, 4 of 4 cycles  pegfilgrastim (NEULASTA ONPRO KIT) injection 6 mg, 6 mg, Subcutaneous, Once, 4 of 4 cycles  cyclophosphamide (CYTOXAN) 1,060 mg in sodium chloride 0.9 % 250 mL chemo infusion, 600 mg/m2 = 1,060 mg, Intravenous,  Once, 4 of 4 cycles  fosaprepitant (EMEND) 150 mg, dexamethasone (DECADRON) 12 mg in sodium chloride 0.9 % 145 mL IVPB, , Intravenous,  Once, 4 of 4 cycles  PACLitaxel (TAXOL) 138 mg in dextrose 5 % 250 mL chemo infusion ( ondansetron (ZOFRAN) 8 mg in sodium chloride 0.9 % 50 mL IVPB, , Intravenous,  Once, 4 of 12 cycles  for chemotherapy treatment.         HISTORY OF PRESENTING ILLNESS:  Kimberly Roman 61 y.o. female is here for follow-up of stage I triple negative breast cancer.  She is doing well today. Her back still aches and the black color under her nails is still there. She has some numbness and tingling on her fingers and toes. Denies chest pain, SOB, or any other concerns.   MEDICAL HISTORY:  Past Medical History:  Diagnosis Date  . Ankylosing spondylitis ([place])   . Arthritis   . Breast cancer ([place])   . Carpal tunnel syndrome   . Environmental allergies   . GERD (gastroesophageal reflux disease)   . Hyperlipemia   . Hypertension   . Invasive ductal carcinoma of breast ([place]) 10/22/2016    SURGICAL HISTORY: Past Surgical History:  Procedure Laterality Date  . ABDOMINAL HYSTERECTOMY    . CARPAL TUNNEL RELEASE Right 04/27/2013   Procedure: RIGHT CARPAL TUNNEL RELEASE;  Surgeon: Carole Civil, MD;  Location: [facility];  Service: Orthopedics;  Laterality: Right;  . COLONOSCOPY N/A 01/20/2013   Procedure: COLONOSCOPY;  Surgeon: Danie Binder, MD;  Location: [facility] END[facility] SUITE;  Service:  Endoscopy;  Laterality: N/A;  9:30 AM  . HEMATOMA EVACUATION Right 10/26/2016   Procedure: EVACUATION HEMATOMA;  Surgeon: Aviva Signs, MD;  Location: [facility] ORS;  Service: General;  Laterality: Right;  . PARTIAL MASTECTOMY WITH AXILLARY SENTINEL LYMPH NODE BIOPSY Right 09/26/2016   Procedure: PARTIAL MASTECTOMY WITH AXILLARY SENTINEL LYMPH NODE BIOPSY;  Surgeon: Aviva Signs, MD;  Location: [facility] ORS;  Service: General;  Laterality: Right;  . PORTACATH PLACEMENT Left 10/26/2016   Procedure: INSERTION PORT-A-CATH;  Surgeon: Aviva Signs, MD;  Location: [facility];  Service: General;  Laterality: Left;    SOCIAL HISTORY: Social History   Social History  . Marital status: Single    Spouse name: N/A  . Number of children: N/A  . Years of education: N/A   Occupational History  . Not on file.   Social History Main Topics  . Smoking status: Never Smoker  . Smokeless tobacco: Never Used  . Alcohol use No  . Drug use: No  . Sexual activity: Not on file   Other Topics Concern  . Not on file   Social History Narrative  . No narrative on file  2 brothers.  Father passed at 34 Not married, no children She is a counseling agent. She enjoys crochet and reading Nonsmoker, no EtOH Born in [place]: Family History  Problem Relation Age of Onset  . Arthritis    . Hypertension Mother   . Hypertension Father   . Colon cancer Neg Hx     ALLERGIES:  is allergic to lipitor [atorvastatin calcium].  MEDICATIONS:  Current Outpatient Prescriptions  Medication Sig Dispense Refill  . acetaminophen (TYLENOL) 650 MG CR tablet Take 650 mg by mouth every 8 (eight) hours as needed for pain.    Marland Kitchen amLODipine (NORVASC) 5 MG tablet Take 5 mg by mouth every morning.    . Artificial Tear Solution (SOOTHE XP) SOLN Apply 2 drops to eye 3 (three) times daily as needed (for dry/irritated eyes).     . diphenhydramine-acetaminophen (TYLENOL PM) 25-500 MG TABS tablet Take 1 tablet by mouth at  bedtime as needed (for pain.).     Marland Kitchen lidocaine-prilocaine (EMLA) cream   2  . lisinopril-hydrochlorothiazide (PRINZIDE,ZESTORETIC) 20-12.5 MG tablet Take 1 tablet by mouth every morning.  2  . omeprazole (PRILOSEC) 20 MG capsule Take 20 mg by mouth every morning.   0  . ondansetron (ZOFRAN) 8 MG tablet   0  . potassium chloride SA (K-DUR,KLOR-CON) 20 MEQ tablet Take 2 tablets (40 mEq total) by mouth daily. 60 tablet 0  . sodium chloride (OCEAN) 0.65 % SOLN nasal spray Place 1 spray into both nostrils 4 (four) times daily as needed (for dry/congestion/allergies.).     No current facility-administered medications for this visit.  Review of Systems  Constitutional: Negative.   HENT: Negative.   Eyes: Negative.   Respiratory: Negative.  Negative for shortness of breath.   Cardiovascular: Negative.  Negative for chest pain.  Gastrointestinal: Negative.   Genitourinary: Negative.   Musculoskeletal: Positive for back pain (aching).  Skin:       Black color under finger nails  Neurological: Positive for tingling (fingers and toes).  Endo/Heme/Allergies: Negative.   Psychiatric/Behavioral: Negative.   All other systems reviewed and are negative. 14 point ROS was done and is otherwise as detailed above or in HPI  PHYSICAL EXAMINATION: ECOG PERFORMANCE STATUS: 0 - Asymptomatic  Vitals:   02/01/17 1023  BP: 128/65  Pulse: 89  Resp: 18  Temp: 98.3 F (36.8 C)   Filed Weights   02/01/17 1023  Weight: 150 lb 3.2 oz (68.1 kg)   Physical Exam  Constitutional: She is oriented to person, place, and time and well-developed, well-nourished, and in no distress.  Alopecia   HENT:  Head: Normocephalic and atraumatic.  Mouth/Throat: Oropharynx is clear and moist.  Eyes: Conjunctivae and EOM are normal. Pupils are equal, round, and reactive to light. No scleral icterus.  Neck: Normal range of motion. Neck supple.  Cardiovascular: Normal rate, regular rhythm and normal heart sounds.     Pulmonary/Chest: Effort normal and breath sounds normal.  Abdominal: Soft. Bowel sounds are normal. She exhibits no distension and no mass. There is no tenderness. There is no rebound and no guarding.  Musculoskeletal: Normal range of motion.  Lymphadenopathy:    She has no cervical adenopathy.  Neurological: She is alert and oriented to person, place, and time. Gait normal.  Skin: Skin is warm and dry.  Psychiatric: Mood, memory, affect and judgment normal.  Nursing note and vitals reviewed.  LABORATORY DATA:  I have reviewed the data as listed CBC Latest Ref Rng & Units 01/25/2017 01/18/2017 01/11/2017  WBC 4.0 - 10.5 K/uL 4.4 5.8 4.2  Hemoglobin 12.0 - 15.0 g/dL 10.4(L) 10.0(L) 10.1(L)  Hematocrit 36.0 - 46.0 % 31.2(L) 31.4(L) 30.6(L)  Platelets 150 - 400 K/uL 261 269 351   CMP Latest Ref Rng & Units 01/25/2017 01/18/2017 01/11/2017  Glucose 65 - 99 mg/dL 98 90 94  BUN 6 - 20 mg/dL 9 7 9   Creatinine 0.44 - 1.00 mg/dL 0.49 0.54 0.48  Sodium 135 - 145 mmol/L 139 139 139  Potassium 3.5 - 5.1 mmol/L 3.6 3.6 3.5  Chloride 101 - 111 mmol/L 104 103 103  CO2 22 - 32 mmol/L 29 30 29   Calcium 8.9 - 10.3 mg/dL 9.4 9.6 9.5  Total Protein 6.5 - 8.1 g/dL 6.9 6.8 6.7  Total Bilirubin 0.3 - 1.2 mg/dL 0.4 0.4 0.5  Alkaline Phos 38 - 126 U/L 74 86 73  AST 15 - 41 U/L 19 20 19   ALT 14 - 54 U/L 19 19 20    PATHOLOGY:    RADIOGRAPHIC STUDIES: I have personally reviewed the radiological images as listed and agreed with the findings in the report.  DG Chest 2 View 09/24/2016 IMPRESSION: Mild hyperinflation consistent with COPD or reactive airway disease. No acute cardiopulmonary abnormality.  ASSESSMENT & PLAN:  Cancer Staging Invasive ductal carcinoma of breast ([place]) Staging form: Breast, AJCC 7th Edition - Clinical stage from 09/28/2016: Stage IA (T1c, N0, M0) - Signed by Baird Cancer, PA-C on 10/22/2016 Triple negative carcinoma of R breast Stage 1 Ankylosing  spondylitis GERD  Continue Taxol cycle 6 today. Tolerating well.   I offered  gapapentin for her neuropathy, but she declined. She will let me know if the neuropathy gets worse.   She will return for follow up in 3 weeks.   All questions were answered. The patient knows to call the clinic with any problems, questions or concerns.  This document serves as a record of services personally performed by Twana First, MD. It was created on her behalf by Martinique Casey, a trained medical scribe. The creation of this record is based on the scribe's personal observations and the provider's statements to them. This document has been checked and approved by the attending provider.  I have reviewed the above documentation for accuracy and completeness and I agree with the above.  This note was electronically signed.    Twana First, MD 02/01/2017 10:28 AM

## 2017-02-05 ENCOUNTER — Encounter: Payer: Self-pay | Admitting: *Deleted

## 2017-02-05 NOTE — Progress Notes (Signed)
[facility] Clinical Social Work  Clinical Social Work contacted pt for assessment of psychosocial needs due to pt being approved by [employer] for assistance. Clinical Social Worker contacted patient via phone to offer support and assess for needs. Pt was excited to learn she was approved for assistance and CSW contacted Mike Gip to accept assistance on her behalf. Pt denied other needs currently and felt she was doing well currently. CSW to continue to follow and assist accordingly.     Clinical Social Work interventions: Resource assistance  [employer], [place] [facility] Tuesdays   [phone]

## 2017-02-08 ENCOUNTER — Encounter (HOSPITAL_COMMUNITY): Payer: Self-pay

## 2017-02-08 ENCOUNTER — Encounter (HOSPITAL_BASED_OUTPATIENT_CLINIC_OR_DEPARTMENT_OTHER): Payer: BLUE CROSS/BLUE SHIELD

## 2017-02-08 VITALS — BP 124/64 | HR 80 | Temp 98.1°F | Resp 16 | Wt 150.4 lb

## 2017-02-08 DIAGNOSIS — C50911 Malignant neoplasm of unspecified site of right female breast: Secondary | ICD-10-CM

## 2017-02-08 DIAGNOSIS — Z5111 Encounter for antineoplastic chemotherapy: Secondary | ICD-10-CM

## 2017-02-08 DIAGNOSIS — C50411 Malignant neoplasm of upper-outer quadrant of right female breast: Secondary | ICD-10-CM

## 2017-02-08 LAB — COMPREHENSIVE METABOLIC PANEL
ALT: 19 U/L (ref 14–54)
AST: 20 U/L (ref 15–41)
Albumin: 3.9 g/dL (ref 3.5–5.0)
Alkaline Phosphatase: 74 U/L (ref 38–126)
Anion gap: 7 (ref 5–15)
BUN: 7 mg/dL (ref 6–20)
CO2: 28 mmol/L (ref 22–32)
Calcium: 9.2 mg/dL (ref 8.9–10.3)
Chloride: 104 mmol/L (ref 101–111)
Creatinine, Ser: 0.59 mg/dL (ref 0.44–1.00)
GFR calc Af Amer: >60 mL/min (ref >60)
GFR calc non Af Amer: >60 mL/min (ref >60)
Glucose, Bld: 100 mg/dL — ABNORMAL HIGH (ref 65–99)
Potassium: 3.5 mmol/L (ref 3.5–5.1)
Sodium: 139 mmol/L (ref 135–145)
Total Bilirubin: 0.6 mg/dL (ref 0.3–1.2)
Total Protein: 6.5 g/dL (ref 6.5–8.1)

## 2017-02-08 LAB — CBC WITH DIFFERENTIAL/PLATELET
Basophils Absolute: 0 10*3/uL (ref 0.0–0.1)
Basophils Relative: 1 %
Eosinophils Absolute: 0.1 10*3/uL (ref 0.0–0.7)
Eosinophils Relative: 1 %
HCT: 30.2 % — ABNORMAL LOW (ref 36.0–46.0)
Hemoglobin: 10 g/dL — ABNORMAL LOW (ref 12.0–15.0)
Lymphocytes Relative: 15 %
Lymphs Abs: 0.8 10*3/uL (ref 0.7–4.0)
MCH: 31.3 pg (ref 26.0–34.0)
MCHC: 33.1 g/dL (ref 30.0–36.0)
MCV: 94.4 fL (ref 78.0–100.0)
Monocytes Absolute: 0.3 10*3/uL (ref 0.1–1.0)
Monocytes Relative: 6 %
Neutro Abs: 3.9 10*3/uL (ref 1.7–7.7)
Neutrophils Relative %: 77 %
Platelets: 277 10*3/uL (ref 150–400)
RBC: 3.2 MIL/uL — ABNORMAL LOW (ref 3.87–5.11)
RDW: 17.2 % — ABNORMAL HIGH (ref 11.5–15.5)
WBC: 5.1 10*3/uL (ref 4.0–10.5)

## 2017-02-08 MED ORDER — HEPARIN SOD (PORK) LOCK FLUSH 100 UNIT/ML IV SOLN
500.0000 [IU] | Freq: Once | INTRAVENOUS | Status: AC | PRN
  Administered 2017-02-08: 500 [IU]
  Filled 2017-02-08: qty 5

## 2017-02-08 MED ORDER — PACLITAXEL CHEMO INJECTION 300 MG/50ML
80.0000 mg/m2 | Freq: Once | INTRAVENOUS | Status: AC
  Administered 2017-02-08: 138 mg via INTRAVENOUS
  Filled 2017-02-08: qty 23

## 2017-02-08 MED ORDER — DIPHENHYDRAMINE HCL 50 MG/ML IJ SOLN
50.0000 mg | Freq: Once | INTRAMUSCULAR | Status: AC
  Administered 2017-02-08: 50 mg via INTRAVENOUS

## 2017-02-08 MED ORDER — FAMOTIDINE IN NACL 20-0.9 MG/50ML-% IV SOLN
20.0000 mg | Freq: Once | INTRAVENOUS | Status: AC
  Administered 2017-02-08: 20 mg via INTRAVENOUS

## 2017-02-08 MED ORDER — DEXAMETHASONE SODIUM PHOSPHATE 100 MG/10ML IJ SOLN
Freq: Once | INTRAMUSCULAR | Status: AC
  Administered 2017-02-08: 13:00:00 via INTRAVENOUS
  Filled 2017-02-08: qty 4

## 2017-02-08 MED ORDER — FAMOTIDINE IN NACL 20-0.9 MG/50ML-% IV SOLN
INTRAVENOUS | Status: AC
Start: 2017-02-08 — End: 2017-02-08
  Filled 2017-02-08: qty 50

## 2017-02-08 MED ORDER — DIPHENHYDRAMINE HCL 50 MG/ML IJ SOLN
INTRAMUSCULAR | Status: AC
  Filled 2017-02-08: qty 1

## 2017-02-08 MED ORDER — SODIUM CHLORIDE 0.9% FLUSH: 10.0000 mL | INTRAVENOUS | Status: DC | PRN

## 2017-02-08 MED ORDER — SODIUM CHLORIDE 0.9 % IV SOLN
Freq: Once | INTRAVENOUS | Status: AC
  Administered 2017-02-08: 12:00:00 via INTRAVENOUS

## 2017-02-08 NOTE — Progress Notes (Signed)
Patient tolerated infusion well.  VSS.  Patient ambulatory and stable upon discharge from clinic.   

## 2017-02-08 NOTE — Patient Instructions (Signed)
[facility] Discharge Instructions for Patients Receiving Chemotherapy   Beginning January 23rd 2017 lab work for the [facility] will be done in the  Main lab at [facility] on 1st floor. If you have a lab appointment with the [facility] please come in thru the  Main Entrance and check in at the main information desk   Today you received the following chemotherapy agent: Taxol.     If you develop nausea and vomiting, or diarrhea that is not controlled by your medication, call the clinic.  The clinic phone number is [phone]. Office hours are Monday-Friday 8:30am-5:00pm.  BELOW ARE SYMPTOMS THAT SHOULD BE REPORTED IMMEDIATELY:  *FEVER GREATER THAN 101.0 F  *CHILLS WITH OR WITHOUT FEVER  NAUSEA AND VOMITING THAT IS NOT CONTROLLED WITH YOUR NAUSEA MEDICATION  *UNUSUAL SHORTNESS OF BREATH  *UNUSUAL BRUISING OR BLEEDING  TENDERNESS IN MOUTH AND THROAT WITH OR WITHOUT PRESENCE OF ULCERS  *URINARY PROBLEMS  *BOWEL PROBLEMS  UNUSUAL RASH Items with * indicate a potential emergency and should be followed up as soon as possible. If you have an emergency after office hours please contact your primary care physician or go to the nearest emergency department.  Please call the clinic during office hours if you have any questions or concerns.   You may also contact the Patient Navigator at [phone] should you have any questions or need assistance in obtaining follow up care.      Resources For Cancer Patients and their Caregivers ? American Cancer Society: Can assist with transportation, wigs, general needs, runs Look Good Feel Better.        727-608-3389 ? Cancer Care: Provides financial assistance, online support groups, medication/co-pay assistance.  1-800-813-HOPE 479-883-5355) ? [facility] Assists [place] cancer patients and their families through emotional , educational and financial support.   (204) 727-0867 ? [place] DSS Where to apply for food stamps, Medicaid and utility assistance. 276-795-9235 ? [facility]: Transportation to medical appointments. 385-111-6566 ? Social Security Administration: May apply for disability if have a Stage IV cancer. 5048213813 (435)404-5476 ? [employer], Disability and Transit Services: Assists with nutrition, care and transit needs. 709-559-1278

## 2017-02-15 ENCOUNTER — Telehealth (HOSPITAL_COMMUNITY): Payer: Self-pay | Admitting: *Deleted

## 2017-02-15 ENCOUNTER — Encounter (HOSPITAL_COMMUNITY): Payer: BLUE CROSS/BLUE SHIELD | Attending: Adult Health | Admitting: Adult Health

## 2017-02-15 ENCOUNTER — Encounter (HOSPITAL_BASED_OUTPATIENT_CLINIC_OR_DEPARTMENT_OTHER): Payer: BLUE CROSS/BLUE SHIELD

## 2017-02-15 ENCOUNTER — Ambulatory Visit (HOSPITAL_COMMUNITY): Payer: BLUE CROSS/BLUE SHIELD

## 2017-02-15 ENCOUNTER — Encounter (HOSPITAL_COMMUNITY): Payer: Self-pay | Admitting: Adult Health

## 2017-02-15 VITALS — BP 127/65 | HR 84 | Temp 97.9°F | Resp 16

## 2017-02-15 VITALS — BP 136/72 | HR 89 | Temp 98.4°F | Resp 16 | Wt 152.8 lb

## 2017-02-15 DIAGNOSIS — C50411 Malignant neoplasm of upper-outer quadrant of right female breast: Secondary | ICD-10-CM

## 2017-02-15 DIAGNOSIS — G4700 Insomnia, unspecified: Secondary | ICD-10-CM | POA: Diagnosis not present

## 2017-02-15 DIAGNOSIS — Z5111 Encounter for antineoplastic chemotherapy: Secondary | ICD-10-CM

## 2017-02-15 DIAGNOSIS — C50911 Malignant neoplasm of unspecified site of right female breast: Secondary | ICD-10-CM | POA: Diagnosis not present

## 2017-02-15 DIAGNOSIS — H04209 Unspecified epiphora, unspecified lacrimal gland: Secondary | ICD-10-CM | POA: Diagnosis not present

## 2017-02-15 DIAGNOSIS — G629 Polyneuropathy, unspecified: Secondary | ICD-10-CM | POA: Diagnosis not present

## 2017-02-15 DIAGNOSIS — Z171 Estrogen receptor negative status [ER-]: Secondary | ICD-10-CM | POA: Diagnosis not present

## 2017-02-15 DIAGNOSIS — C50919 Malignant neoplasm of unspecified site of unspecified female breast: Secondary | ICD-10-CM

## 2017-02-15 LAB — COMPREHENSIVE METABOLIC PANEL
ALT: 21 U/L (ref 14–54)
AST: 21 U/L (ref 15–41)
Albumin: 3.9 g/dL (ref 3.5–5.0)
Alkaline Phosphatase: 76 U/L (ref 38–126)
Anion gap: 6 (ref 5–15)
BUN: 8 mg/dL (ref 6–20)
CO2: 30 mmol/L (ref 22–32)
Calcium: 9.4 mg/dL (ref 8.9–10.3)
Chloride: 106 mmol/L (ref 101–111)
Creatinine, Ser: 0.52 mg/dL (ref 0.44–1.00)
GFR calc Af Amer: >60 mL/min (ref >60)
GFR calc non Af Amer: >60 mL/min (ref >60)
Glucose, Bld: 96 mg/dL (ref 65–99)
Potassium: 3.5 mmol/L (ref 3.5–5.1)
Sodium: 142 mmol/L (ref 135–145)
Total Bilirubin: 0.3 mg/dL (ref 0.3–1.2)
Total Protein: 6.6 g/dL (ref 6.5–8.1)

## 2017-02-15 LAB — CBC WITH DIFFERENTIAL/PLATELET
Basophils Absolute: 0 10*3/uL (ref 0.0–0.1)
Basophils Relative: 1 %
Eosinophils Absolute: 0.1 10*3/uL (ref 0.0–0.7)
Eosinophils Relative: 1 %
HCT: 31.4 % — ABNORMAL LOW (ref 36.0–46.0)
Hemoglobin: 10.3 g/dL — ABNORMAL LOW (ref 12.0–15.0)
Lymphocytes Relative: 18 %
Lymphs Abs: 0.9 10*3/uL (ref 0.7–4.0)
MCH: 31.3 pg (ref 26.0–34.0)
MCHC: 32.8 g/dL (ref 30.0–36.0)
MCV: 95.4 fL (ref 78.0–100.0)
Monocytes Absolute: 0.4 10*3/uL (ref 0.1–1.0)
Monocytes Relative: 8 %
Neutro Abs: 3.8 10*3/uL (ref 1.7–7.7)
Neutrophils Relative %: 72 %
Platelets: 322 10*3/uL (ref 150–400)
RBC: 3.29 MIL/uL — ABNORMAL LOW (ref 3.87–5.11)
RDW: 16.9 % — ABNORMAL HIGH (ref 11.5–15.5)
WBC: 5.2 10*3/uL (ref 4.0–10.5)

## 2017-02-15 MED ORDER — FAMOTIDINE IN NACL 20-0.9 MG/50ML-% IV SOLN
20.0000 mg | Freq: Once | INTRAVENOUS | Status: AC
  Administered 2017-02-15: 20 mg via INTRAVENOUS

## 2017-02-15 MED ORDER — HEPARIN SOD (PORK) LOCK FLUSH 100 UNIT/ML IV SOLN
500.0000 [IU] | Freq: Once | INTRAVENOUS | Status: AC | PRN
  Administered 2017-02-15: 500 [IU]
  Filled 2017-02-15: qty 5

## 2017-02-15 MED ORDER — SODIUM CHLORIDE 0.9 % IV SOLN
Freq: Once | INTRAVENOUS | Status: AC
  Administered 2017-02-15: 12:00:00 via INTRAVENOUS

## 2017-02-15 MED ORDER — DIPHENHYDRAMINE HCL 50 MG/ML IJ SOLN
INTRAMUSCULAR | Status: AC
  Filled 2017-02-15: qty 1

## 2017-02-15 MED ORDER — FAMOTIDINE IN NACL 20-0.9 MG/50ML-% IV SOLN
INTRAVENOUS | Status: AC
  Filled 2017-02-15: qty 50

## 2017-02-15 MED ORDER — SODIUM CHLORIDE 0.9 % IV SOLN
Freq: Once | INTRAVENOUS | Status: AC
  Administered 2017-02-15: 12:00:00 via INTRAVENOUS
  Filled 2017-02-15: qty 4

## 2017-02-15 MED ORDER — PACLITAXEL CHEMO INJECTION 300 MG/50ML
80.0000 mg/m2 | Freq: Once | INTRAVENOUS | Status: AC
Start: 2017-02-15 — End: 2017-02-15
  Administered 2017-02-15: 138 mg via INTRAVENOUS
  Filled 2017-02-15: qty 23

## 2017-02-15 MED ORDER — DIPHENHYDRAMINE HCL 50 MG/ML IJ SOLN
50.0000 mg | Freq: Once | INTRAMUSCULAR | Status: AC
  Administered 2017-02-15: 50 mg via INTRAVENOUS

## 2017-02-15 NOTE — Progress Notes (Signed)
[place] [place], [place] [address]   CLINIC:  Medical Oncology/Hematology  PCP:  Rosita Fire, MD [place] [address] [phone]   REASON FOR VISIT:  Follow-up for Stage IA (T1cN0M0) invasive ductal carcinoma of right breast; ER-/PR-/HER2-  CURRENT THERAPY: Adjuvant chemotherapy s/p Adriamycin/Cytoxan x 4; now Taxol weekly    BRIEF ONCOLOGIC HISTORY:    Invasive ductal carcinoma of breast ([place])   08/13/2016 Mammogram    BI-RADS CATEGORY  0: Incomplete.      08/21/2016 Mammogram    BI-RADS CATEGORY  5: Highly suggestive of malignancy. Suspicious 1.2 cm mass 10 o'clock position right breast.      08/21/2016 Breast US    Targeted ultrasound is performed, showing a hypoechoic irregular mass with indistinct margins at 10 o'clock position 9 cm from the nipple measuring approximately 1.1 x 1.2 x 1.0 cm. No additional masses are seen in this region of the right breast on ultrasound.      08/28/2016 Procedure    Right needle core biopsy      09/26/2016 Procedure    Right upper outer quadrant lumpectomy by Dr. Arnoldo Morale      09/28/2016 Pathology Results    Invasive ductal carcinoma, grade 3, standing 1.7 cm. High-grade ductal carcinoma in situ with comedonecrosis. Invasive carcinoma in less than 0.1 cm of the superior medial margin focally. In situ carcinoma is less than 0.1 cm of superior medial margin and the posterior margin focally. No LVI. ER negative. PR negative. HER-2 negative. KI-67 90%.      09/28/2016 Cancer Staging    Invasive ductal carcinoma of breast [facility])   Staging form: Breast, AJCC 7th Edition   - Clinical stage from 09/28/2016: Stage IA (T1c, N0, M0) - Signed by Baird Cancer, PA-C on 10/22/2016      09/29/2016 Pathology Results    Invasive ductal carcinoma, grade 2/3. HER-2 negative. ER negative. PR negative. Ki-67 90%.      10/24/2016 Echocardiogram    - Left ventricle: The cavity size was  normal. Wall thickness was   normal. Systolic function was normal. The estimated ejection   fraction was in the range of 60% to 65%. Wall motion was normal;   there were no regional wall motion abnormalities.       10/26/2016 Procedure    Port placed placed by Dr. Arnoldo Morale      11/02/2016 -  Chemotherapy    The patient had DOXOrubicin (ADRIAMYCIN) chemo injection 106 mg, 60 mg/m2 = 106 mg, Intravenous,  Once, 4 of 4 cycles  palonosetron (ALOXI) injection 0.25 mg, 0.25 mg, Intravenous,  Once, 4 of 4 cycles  pegfilgrastim (NEULASTA ONPRO KIT) injection 6 mg, 6 mg, Subcutaneous, Once, 4 of 4 cycles  cyclophosphamide (CYTOXAN) 1,060 mg in sodium chloride 0.9 % 250 mL chemo infusion, 600 mg/m2 = 1,060 mg, Intravenous,  Once, 4 of 4 cycles  fosaprepitant (EMEND) 150 mg, dexamethasone (DECADRON) 12 mg in sodium chloride 0.9 % 145 mL IVPB, , Intravenous,  Once, 4 of 4 cycles  PACLitaxel (TAXOL) 138 mg in dextrose 5 % 250 mL chemo infusion ( ondansetron (ZOFRAN) 8 mg in sodium chloride 0.9 % 50 mL IVPB, , Intravenous,  Once, 4 of 12 cycles  for chemotherapy treatment.          INTERVAL HISTORY:  Kimberly Roman presents for routine follow-up and her next cycle of Taxol.    She is due for cycle #8 today.  Overall, she  feels pretty well.  Her appetite is good; her energy levels are low at times, "but I just rest and it gets better."  Continues to report occasional peripheral neuropathy to her fingertips; she sometimes has trouble with buttoning her shirt or writing her name. Her right hand is worse than the left, which she attributes to her history of carpal tunnel in the right wrist as well.  Overall, she does not feel that her neuropathy is worse; "it's about the same."   Reports epiphora and occasional bilat cheek redness/pruritis. She takes Claritin daily, which helps. Denies any headaches. Continues to have intermittent back pain, which has been chronic and has not worsened. She uses Icy Hot  cream, which is helpful. She has occasional insomnia; Claritin helps her sleep as well.  She tells me she was told not to take Benadryl and Claritin together, so she does not take Claritin on the nights before chemotherapy and sometimes has trouble sleeping.     REVIEW OF SYSTEMS:  Review of Systems  Constitutional: Negative for appetite change, chills and fever.  HENT:  Negative.  Negative for lump/mass and nosebleeds.   Eyes:       + Epiphora   Respiratory: Negative.  Negative for cough and shortness of breath.   Cardiovascular: Negative.  Negative for chest pain and leg swelling.  Gastrointestinal: Negative.  Negative for abdominal pain, blood in stool, constipation, diarrhea, nausea and vomiting.  Endocrine: Negative.   Genitourinary: Negative.  Negative for dysuria and hematuria.   Musculoskeletal: Positive for back pain. Negative for arthralgias.  Skin: Negative.  Negative for rash.       Nail darkening/changes since starting chemotherapy   Neurological: Positive for numbness (peripheral neuropathy to fingertips and toes ). Negative for dizziness and headaches.  Hematological: Negative.  Negative for adenopathy. Does not bruise/bleed easily.  Psychiatric/Behavioral: Positive for sleep disturbance. Negative for depression. The patient is not nervous/anxious.      PAST MEDICAL/SURGICAL HISTORY:  Past Medical History:  Diagnosis Date  . Ankylosing spondylitis ([place])   . Arthritis   . Breast cancer ([place])   . Carpal tunnel syndrome   . Environmental allergies   . GERD (gastroesophageal reflux disease)   . Hyperlipemia   . Hypertension   . Invasive ductal carcinoma of breast ([place]) 10/22/2016   Past Surgical History:  Procedure Laterality Date  . ABDOMINAL HYSTERECTOMY    . CARPAL TUNNEL RELEASE Right 04/27/2013   Procedure: RIGHT CARPAL TUNNEL RELEASE;  Surgeon: Carole Civil, MD;  Location: [facility];  Service: Orthopedics;  Laterality: Right;  . COLONOSCOPY N/A 01/20/2013    Procedure: COLONOSCOPY;  Surgeon: Danie Binder, MD;  Location: [facility] END[facility] SUITE;  Service: Endoscopy;  Laterality: N/A;  9:30 AM  . HEMATOMA EVACUATION Right 10/26/2016   Procedure: EVACUATION HEMATOMA;  Surgeon: Aviva Signs, MD;  Location: [facility];  Service: General;  Laterality: Right;  . PARTIAL MASTECTOMY WITH AXILLARY SENTINEL LYMPH NODE BIOPSY Right 09/26/2016   Procedure: PARTIAL MASTECTOMY WITH AXILLARY SENTINEL LYMPH NODE BIOPSY;  Surgeon: Aviva Signs, MD;  Location: [facility];  Service: General;  Laterality: Right;  . PORTACATH PLACEMENT Left 10/26/2016   Procedure: INSERTION PORT-A-CATH;  Surgeon: Aviva Signs, MD;  Location: [facility];  Service: General;  Laterality: Left;     SOCIAL HISTORY:  Social History   Social History  . Marital status: Single    Spouse name: N/A  . Number of children: N/A  . Years of education: N/A   Occupational History  .  Not on file.   Social History Main Topics  . Smoking status: Never Smoker  . Smokeless tobacco: Never Used  . Alcohol use No  . Drug use: No  . Sexual activity: Not on file   Other Topics Concern  . Not on file   Social History Narrative  . No narrative on file    FAMILY HISTORY:  Family History  Problem Relation Age of Onset  . Arthritis    . Hypertension Mother   . Hypertension Father   . Colon cancer Neg Hx     CURRENT MEDICATIONS:  Outpatient Encounter Prescriptions as of 02/15/2017  Medication Sig  . acetaminophen (TYLENOL) 650 MG CR tablet Take 650 mg by mouth every 8 (eight) hours as needed for pain.  Marland Kitchen amLODipine (NORVASC) 5 MG tablet Take 5 mg by mouth every morning.  . Artificial Tear Solution (SOOTHE XP) SOLN Apply 2 drops to eye 3 (three) times daily as needed (for dry/irritated eyes).   . diphenhydramine-acetaminophen (TYLENOL PM) 25-500 MG TABS tablet Take 1 tablet by mouth at bedtime as needed (for pain.).   Marland Kitchen lidocaine-prilocaine (EMLA) cream   . lisinopril-hydrochlorothiazide (PRINZIDE,ZESTORETIC)  20-12.5 MG tablet Take 1 tablet by mouth every morning.  Marland Kitchen omeprazole (PRILOSEC) 20 MG capsule Take 20 mg by mouth every morning.   . ondansetron (ZOFRAN) 8 MG tablet   . potassium chloride SA (K-DUR,KLOR-CON) 20 MEQ tablet Take 2 tablets (40 mEq total) by mouth daily.  . sodium chloride (OCEAN) 0.65 % SOLN nasal spray Place 1 spray into both nostrils 4 (four) times daily as needed (for dry/congestion/allergies.).   No facility-administered encounter medications on file as of 02/15/2017.     ALLERGIES:  Allergies  Allergen Reactions  . Lipitor [Atorvastatin Calcium] Other (See Comments)    REACTION: Cramping in legs/lower extremties     PHYSICAL EXAM:  ECOG Performance status: 1 - Symptomatic, but independent.   Vitals:   02/15/17 0919  BP: 136/72  Pulse: 89  Resp: 16  Temp: 98.4 F (36.9 C)   Filed Weights   02/15/17 0919  Weight: 152 lb 12.8 oz (69.3 kg)    Physical Exam  Constitutional: She is oriented to person, place, and time and well-developed, well-nourished, and in no distress.  HENT:  Head: Normocephalic.  Mouth/Throat: Oropharynx is clear and moist. No oropharyngeal exudate.  Eyes: Conjunctivae are normal. Pupils are equal, round, and reactive to light. No scleral icterus.  + Epiphora noted (right worse than left)   Neck: Normal range of motion. Neck supple.  Cardiovascular: Normal rate, regular rhythm and normal heart sounds.   Pulmonary/Chest: Effort normal and breath sounds normal. No respiratory distress.  Abdominal: Soft. Bowel sounds are normal. There is no tenderness.  Musculoskeletal: Normal range of motion. She exhibits no edema.  Lymphadenopathy:    She has no cervical adenopathy.       Right: No supraclavicular adenopathy present.       Left: No supraclavicular adenopathy present.  Neurological: She is alert and oriented to person, place, and time. No cranial nerve deficit. Gait normal.  Skin: Skin is warm and dry. No rash noted.  Nail darkening  (expected finding with chemotherapy)  Bilateral cheeks with mild pink areas; no current pruritis per patient.   Psychiatric: Mood, memory, affect and judgment normal.  Nursing note and vitals reviewed.    LABORATORY DATA:  I have reviewed the labs as listed.  CBC    Component Value Date/Time   WBC 5.1 02/08/2017 1047  RBC 3.20 (L) 02/08/2017 1047   HGB 10.0 (L) 02/08/2017 1047   HCT 30.2 (L) 02/08/2017 1047   PLT 277 02/08/2017 1047   MCV 94.4 02/08/2017 1047   MCH 31.3 02/08/2017 1047   MCHC 33.1 02/08/2017 1047   RDW 17.2 (H) 02/08/2017 1047   LYMPHSABS 0.8 02/08/2017 1047   MONOABS 0.3 02/08/2017 1047   EOSABS 0.1 02/08/2017 1047   BASOSABS 0.0 02/08/2017 1047   CMP Latest Ref Rng & Units 02/08/2017 02/01/2017 01/25/2017  Glucose 65 - 99 mg/dL 100(H) 98 98  BUN 6 - 20 mg/dL 7 10 9   Creatinine 0.44 - 1.00 mg/dL 0.59 0.47 0.49  Sodium 135 - 145 mmol/L 139 139 139  Potassium 3.5 - 5.1 mmol/L 3.5 3.6 3.6  Chloride 101 - 111 mmol/L 104 105 104  CO2 22 - 32 mmol/L 28 29 29   Calcium 8.9 - 10.3 mg/dL 9.2 9.4 9.4  Total Protein 6.5 - 8.1 g/dL 6.5 6.7 6.9  Total Bilirubin 0.3 - 1.2 mg/dL 0.6 0.4 0.4  Alkaline Phos 38 - 126 U/L 74 77 74  AST 15 - 41 U/L 20 18 19   ALT 14 - 54 U/L 19 19 19     PENDING LABS:    DIAGNOSTIC IMAGING:    PATHOLOGY:  Right breast lumpectomy surgical path: 09/26/16      ASSESSMENT & PLAN:   Stage IA (T1cN0M0) invasive ductal carcinoma of right breast; ER-/PR-/HER2-: -s/p lumpectomy and adjuvant chemotherapy with Adriamycin/Cytoxan x 4 cycles. Adjuvant chemo continues with Taxol weekly x 12 planned cycles. Discussed that she will likely need adjuvant radiation therapy after completing chemotherapy.  We discussed that she has 2 options for radiation therapy which include: in [place] where she could remain in the [facility], or in [place] where she would be treated by [facility] for radiation.  She elects to proceed with radiation at [facility]  because it will be closer to her home.  Discussed that she would not need anti-estrogen therapy after completing radiation therapy since her breast cancer is ER-. Briefly reviewed surveillance guidelines including frequent H&P with breast exams and annual mammogram.   -Will place referral to [facility] for her to be seen sometime within the 2-4 weeks or so.  -Labs reviewed and are adequate for cycle #8 Taxol today.  -Continue weekly Taxol. Return for follow-up visit and chemo in 2 weeks.   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 She describes the neuropathy as "about the same."  She continues to not want to try any medications for neuropathy at this time, as her symptoms are not worse.  We will certainly continue to monitor her neuropathy symptoms going forward with Taxol therapy.   Epiphora:  -Could be secondary to Taxol or seasonal allergies.  -Continue Claritin and OTC eye drops, which are helpful for her.    Insomnia:  -Claritin or other OTC anti-histamine therapy effective in helping initiate sleep for her.  -Provided reassurance that it is okay for her to take her Claritin at bedtime on the nights before chemotherapy when she will receive Benadryl as a pre-med before chemo.  Explained that these drugs are anti-histamines and can cause drowsiness, so avoiding taking them at the same time is reasonable. However, taking Claritin at bedtime and getting Benadryl here at cancer center would be safe.      Dispo:  -Return to cancer center next week for next cycle Taxol.  -Follow-up visit in 2 weeks.  All questions were answered to patient's stated satisfaction. Encouraged patient to call with any new concerns or questions before her next visit to the cancer center and we can certain see her sooner, if needed.      Orders placed this encounter:  No orders of the defined types were placed in this encounter.     Mike Craze, NP [place] [phone]

## 2017-02-15 NOTE — Telephone Encounter (Signed)
Pt did not want to make a copay today 

## 2017-02-15 NOTE — Patient Instructions (Signed)
[facility] Discharge Instructions for Patients Receiving Chemotherapy   Beginning January 23rd 2017 lab work for the [facility] will be done in the  Main lab at [facility] on 1st floor. If you have a lab appointment with the [facility] please come in thru the  Main Entrance and check in at the main information desk   Today you received the following chemotherapy agents taxol.  To help prevent nausea and vomiting after your treatment, we encourage you to take your nausea medication as instructed.   If you develop nausea and vomiting, or diarrhea that is not controlled by your medication, call the clinic.  The clinic phone number is [phone]. Office hours are Monday-Friday 8:30am-5:00pm.  BELOW ARE SYMPTOMS THAT SHOULD BE REPORTED IMMEDIATELY:  *FEVER GREATER THAN 101.0 F  *CHILLS WITH OR WITHOUT FEVER  NAUSEA AND VOMITING THAT IS NOT CONTROLLED WITH YOUR NAUSEA MEDICATION  *UNUSUAL SHORTNESS OF BREATH  *UNUSUAL BRUISING OR BLEEDING  TENDERNESS IN MOUTH AND THROAT WITH OR WITHOUT PRESENCE OF ULCERS  *URINARY PROBLEMS  *BOWEL PROBLEMS  UNUSUAL RASH Items with * indicate a potential emergency and should be followed up as soon as possible. If you have an emergency after office hours please contact your primary care physician or go to the nearest emergency department.  Please call the clinic during office hours if you have any questions or concerns.   You may also contact the Patient Navigator at [phone] should you have any questions or need assistance in obtaining follow up care.      Resources For Cancer Patients and their Caregivers ? American Cancer Society: Can assist with transportation, wigs, general needs, runs Look Good Feel Better.        (703)650-2866 ? Cancer Care: Provides financial assistance, online support groups, medication/co-pay assistance.  1-800-813-HOPE (626) 523-5118) ? [facility] Assists  [place] cancer patients and their families through emotional , educational and financial support.  (737) 426-5881 ? [place] DSS Where to apply for food stamps, Medicaid and utility assistance. 913-491-1312 ? RCATS: Transportation to medical appointments. (985) 291-8737 ? Social Security Administration: May apply for disability if have a Stage IV cancer. [phone] 903-359-0693 ? [employer], Disability and Transit Services: Assists with nutrition, care and transit needs. (323)640-5313

## 2017-02-15 NOTE — Patient Instructions (Signed)
[facility] at [facility] Discharge Instructions  RECOMMENDATIONS MADE BY THE CONSULTANT AND ANY TEST RESULTS WILL BE SENT TO YOUR REFERRING PHYSICIAN.  You saw Gretchen Dawson, NP, today See Amy at checkout for appointments.   Thank you for choosing [facility] at [facility] to provide your oncology and hematology care.  To afford each patient quality time with our provider, please arrive at least 15 minutes before your scheduled appointment time.    If you have a lab appointment with the Cancer Center please come in thru the  Main Entrance and check in at the main information desk  You need to re-schedule your appointment should you arrive 10 or more minutes late.  We strive to give you quality time with our providers, and arriving late affects you and other patients whose appointments are after yours.  Also, if you no show three or more times for appointments you may be dismissed from the clinic at the providers discretion.     Again, thank you for choosing [facility].  Our hope is that these requests will decrease the amount of time that you wait before being seen by our physicians.       _____________________________________________________________  Should you have questions after your visit to [facility], please contact our office at [phone] between the hours of 8:30 a.m. and 4:30 p.m.  Voicemails left after 4:30 p.m. will not be returned until the following business day.  For prescription refill requests, have your pharmacy contact our office.       Resources For Cancer Patients and their Caregivers ? American Cancer Society: Can assist with transportation, wigs, general needs, runs Look Good Feel Better.        1-888-227-6333 ? Cancer Care: Provides financial assistance, online support groups, medication/co-pay assistance.  1-800-813-HOPE (4673) ? [facility] Assists  [place] cancer patients and their families through emotional , educational and financial support.  336-427-4357 ? [place] DSS Where to apply for food stamps, Medicaid and utility assistance. 336-342-1394 ? RCATS: Transportation to medical appointments. 336-347-2287 ? Social Security Administration: May apply for disability if have a Stage IV cancer. 336-342-7796 1-800-772-1213 ? [place] Aging, Disability and Transit Services: Assists with nutrition, care and transit needs. 336-349-2343  Cancer Center Support Programs: @[email or web address]@ > Cancer Support Group  2nd Tuesday of the month 1pm-2pm, Journey Room  > Creative Journey  3rd Tuesday of the month 1130am-1pm, Journey Room  > Look Good Feel Better  1st Wednesday of the month 10am-12 noon, Journey Room (Call American Cancer Society to register 1-800-395-5775)    

## 2017-02-15 NOTE — Progress Notes (Signed)
Tolerated chemo well. Stable and ambulatory on discharge home to self. 

## 2017-02-21 ENCOUNTER — Encounter (HOSPITAL_COMMUNITY): Payer: Self-pay

## 2017-02-21 ENCOUNTER — Encounter (HOSPITAL_BASED_OUTPATIENT_CLINIC_OR_DEPARTMENT_OTHER): Payer: BLUE CROSS/BLUE SHIELD

## 2017-02-21 VITALS — BP 126/72 | HR 80 | Temp 98.4°F | Resp 18 | Wt 153.0 lb

## 2017-02-21 DIAGNOSIS — C50911 Malignant neoplasm of unspecified site of right female breast: Secondary | ICD-10-CM

## 2017-02-21 DIAGNOSIS — Z5111 Encounter for antineoplastic chemotherapy: Secondary | ICD-10-CM | POA: Diagnosis not present

## 2017-02-21 DIAGNOSIS — C50411 Malignant neoplasm of upper-outer quadrant of right female breast: Secondary | ICD-10-CM | POA: Diagnosis not present

## 2017-02-21 LAB — CBC WITH DIFFERENTIAL/PLATELET
Basophils Absolute: 0 10*3/uL (ref 0.0–0.1)
Basophils Relative: 1 %
Eosinophils Absolute: 0 10*3/uL (ref 0.0–0.7)
Eosinophils Relative: 1 %
HCT: 31.2 % — ABNORMAL LOW (ref 36.0–46.0)
Hemoglobin: 10.1 g/dL — ABNORMAL LOW (ref 12.0–15.0)
Lymphocytes Relative: 18 %
Lymphs Abs: 0.7 10*3/uL (ref 0.7–4.0)
MCH: 30.9 pg (ref 26.0–34.0)
MCHC: 32.4 g/dL (ref 30.0–36.0)
MCV: 95.4 fL (ref 78.0–100.0)
Monocytes Absolute: 0.2 10*3/uL (ref 0.1–1.0)
Monocytes Relative: 5 %
Neutro Abs: 3.1 10*3/uL (ref 1.7–7.7)
Neutrophils Relative %: 75 %
Platelets: 313 10*3/uL (ref 150–400)
RBC: 3.27 MIL/uL — ABNORMAL LOW (ref 3.87–5.11)
RDW: 16 % — ABNORMAL HIGH (ref 11.5–15.5)
WBC: 4.1 10*3/uL (ref 4.0–10.5)

## 2017-02-21 LAB — COMPREHENSIVE METABOLIC PANEL
ALT: 19 U/L (ref 14–54)
AST: 18 U/L (ref 15–41)
Albumin: 4 g/dL (ref 3.5–5.0)
Alkaline Phosphatase: 76 U/L (ref 38–126)
Anion gap: 6 (ref 5–15)
BUN: 8 mg/dL (ref 6–20)
CO2: 30 mmol/L (ref 22–32)
Calcium: 9.3 mg/dL (ref 8.9–10.3)
Chloride: 104 mmol/L (ref 101–111)
Creatinine, Ser: 0.62 mg/dL (ref 0.44–1.00)
GFR calc Af Amer: >60 mL/min (ref >60)
GFR calc non Af Amer: >60 mL/min (ref >60)
Glucose, Bld: 96 mg/dL (ref 65–99)
Potassium: 3.5 mmol/L (ref 3.5–5.1)
Sodium: 140 mmol/L (ref 135–145)
Total Bilirubin: 0.6 mg/dL (ref 0.3–1.2)
Total Protein: 6.5 g/dL (ref 6.5–8.1)

## 2017-02-21 MED ORDER — DIPHENHYDRAMINE HCL 50 MG/ML IJ SOLN
50.0000 mg | Freq: Once | INTRAMUSCULAR | Status: AC
  Administered 2017-02-21: 50 mg via INTRAVENOUS
  Filled 2017-02-21: qty 1

## 2017-02-21 MED ORDER — FAMOTIDINE IN NACL 20-0.9 MG/50ML-% IV SOLN
20.0000 mg | Freq: Once | INTRAVENOUS | Status: AC
  Administered 2017-02-21: 20 mg via INTRAVENOUS
  Filled 2017-02-21: qty 50

## 2017-02-21 MED ORDER — DIPHENHYDRAMINE HCL 50 MG/ML IJ SOLN
INTRAMUSCULAR | Status: AC
  Filled 2017-02-21: qty 1

## 2017-02-21 MED ORDER — PACLITAXEL CHEMO INJECTION 300 MG/50ML
80.0000 mg/m2 | Freq: Once | INTRAVENOUS | Status: AC
  Administered 2017-02-21: 138 mg via INTRAVENOUS
  Filled 2017-02-21: qty 23

## 2017-02-21 MED ORDER — SODIUM CHLORIDE 0.9 % IV SOLN
Freq: Once | INTRAVENOUS | Status: AC
  Administered 2017-02-21: 12:00:00 via INTRAVENOUS

## 2017-02-21 MED ORDER — SODIUM CHLORIDE 0.9 % IV SOLN
Freq: Once | INTRAVENOUS | Status: AC
  Administered 2017-02-21: 13:00:00 via INTRAVENOUS
  Filled 2017-02-21: qty 4

## 2017-02-21 MED ORDER — HEPARIN SOD (PORK) LOCK FLUSH 100 UNIT/ML IV SOLN
500.0000 [IU] | Freq: Once | INTRAVENOUS | Status: AC | PRN
  Administered 2017-02-21: 500 [IU]

## 2017-02-21 MED ORDER — SODIUM CHLORIDE 0.9% FLUSH
10.0000 mL | INTRAVENOUS | Status: DC | PRN
  Administered 2017-02-21: 10 mL
  Filled 2017-02-21: qty 10

## 2017-02-21 NOTE — Patient Instructions (Signed)
[facility] Discharge Instructions for Patients Receiving Chemotherapy   Beginning January 23rd 2017 lab work for the Cancer Center will be done in the  Main lab at [facility] on 1st floor. If you have a lab appointment with the Cancer Center please come in thru the  Main Entrance and check in at the main information desk   Today you received the following chemotherapy agents Taxol. Follow-up as scheduled. Call clinic for any questions or concerns  To help prevent nausea and vomiting after your treatment, we encourage you to take your nausea medication   If you develop nausea and vomiting, or diarrhea that is not controlled by your medication, call the clinic.  The clinic phone number is [phone]. Office hours are Monday-Friday 8:30am-5:00pm.  BELOW ARE SYMPTOMS THAT SHOULD BE REPORTED IMMEDIATELY:  *FEVER GREATER THAN 101.0 F  *CHILLS WITH OR WITHOUT FEVER  NAUSEA AND VOMITING THAT IS NOT CONTROLLED WITH YOUR NAUSEA MEDICATION  *UNUSUAL SHORTNESS OF BREATH  *UNUSUAL BRUISING OR BLEEDING  TENDERNESS IN MOUTH AND THROAT WITH OR WITHOUT PRESENCE OF ULCERS  *URINARY PROBLEMS  *BOWEL PROBLEMS  UNUSUAL RASH Items with * indicate a potential emergency and should be followed up as soon as possible. If you have an emergency after office hours please contact your primary care physician or go to the nearest emergency department.  Please call the clinic during office hours if you have any questions or concerns.   You may also contact the Patient Navigator at [phone] should you have any questions or need assistance in obtaining follow up care.      Resources For Cancer Patients and their Caregivers ? American Cancer Society: Can assist with transportation, wigs, general needs, runs Look Good Feel Better.        1-888-227-6333 ? Cancer Care: Provides financial assistance, online support groups, medication/co-pay assistance.  1-800-813-HOPE  (4673) ? [facility] Assists [place] cancer patients and their families through emotional , educational and financial support.  336-427-4357 ? [place] DSS Where to apply for food stamps, Medicaid and utility assistance. 336-342-1394 ? RCATS: Transportation to medical appointments. 336-347-2287 ? Social Security Administration: May apply for disability if have a Stage IV cancer. 336-342-7796 1-800-772-1213 ? [place] Aging, Disability and Transit Services: Assists with nutrition, care and transit needs. 336-349-2343         

## 2017-02-21 NOTE — Progress Notes (Signed)
Taiwan Millon Tasso tolerated chemo tx well without complaints or incident. Labs and pt's side effects reviewed with Dr Talbert Cage prior to administering chemotherapy. VSS upon discharge. Pt discharged self ambulatory in satisfactory condition accompanied by her mother

## 2017-03-01 ENCOUNTER — Encounter (HOSPITAL_BASED_OUTPATIENT_CLINIC_OR_DEPARTMENT_OTHER): Payer: BLUE CROSS/BLUE SHIELD

## 2017-03-01 ENCOUNTER — Encounter (HOSPITAL_COMMUNITY): Payer: Self-pay | Admitting: Adult Health

## 2017-03-01 ENCOUNTER — Encounter (HOSPITAL_COMMUNITY): Payer: BLUE CROSS/BLUE SHIELD | Attending: Hematology & Oncology | Admitting: Adult Health

## 2017-03-01 DIAGNOSIS — G629 Polyneuropathy, unspecified: Secondary | ICD-10-CM

## 2017-03-01 DIAGNOSIS — G620 Drug-induced polyneuropathy: Secondary | ICD-10-CM

## 2017-03-01 DIAGNOSIS — Z171 Estrogen receptor negative status [ER-]: Secondary | ICD-10-CM

## 2017-03-01 DIAGNOSIS — C50911 Malignant neoplasm of unspecified site of right female breast: Secondary | ICD-10-CM

## 2017-03-01 DIAGNOSIS — C50411 Malignant neoplasm of upper-outer quadrant of right female breast: Secondary | ICD-10-CM

## 2017-03-01 DIAGNOSIS — C50919 Malignant neoplasm of unspecified site of unspecified female breast: Secondary | ICD-10-CM

## 2017-03-01 DIAGNOSIS — H04209 Unspecified epiphora, unspecified lacrimal gland: Secondary | ICD-10-CM | POA: Diagnosis not present

## 2017-03-01 DIAGNOSIS — E876 Hypokalemia: Secondary | ICD-10-CM | POA: Diagnosis not present

## 2017-03-01 DIAGNOSIS — D72825 Bandemia: Secondary | ICD-10-CM | POA: Insufficient documentation

## 2017-03-01 DIAGNOSIS — Z452 Encounter for adjustment and management of vascular access device: Secondary | ICD-10-CM

## 2017-03-01 DIAGNOSIS — T451X5A Adverse effect of antineoplastic and immunosuppressive drugs, initial encounter: Secondary | ICD-10-CM

## 2017-03-01 LAB — COMPREHENSIVE METABOLIC PANEL
ALT: 20 U/L (ref 14–54)
AST: 21 U/L (ref 15–41)
Albumin: 4 g/dL (ref 3.5–5.0)
Alkaline Phosphatase: 76 U/L (ref 38–126)
Anion gap: 5 (ref 5–15)
BUN: 8 mg/dL (ref 6–20)
CO2: 29 mmol/L (ref 22–32)
Calcium: 9.3 mg/dL (ref 8.9–10.3)
Chloride: 105 mmol/L (ref 101–111)
Creatinine, Ser: 0.59 mg/dL (ref 0.44–1.00)
GFR calc Af Amer: >60 mL/min (ref >60)
GFR calc non Af Amer: >60 mL/min (ref >60)
Glucose, Bld: 101 mg/dL — ABNORMAL HIGH (ref 65–99)
Potassium: 3.3 mmol/L — ABNORMAL LOW (ref 3.5–5.1)
Sodium: 139 mmol/L (ref 135–145)
Total Bilirubin: 0.2 mg/dL — ABNORMAL LOW (ref 0.3–1.2)
Total Protein: 6.6 g/dL (ref 6.5–8.1)

## 2017-03-01 LAB — CBC WITH DIFFERENTIAL/PLATELET
Basophils Absolute: 0.1 10*3/uL (ref 0.0–0.1)
Basophils Relative: 1 %
Eosinophils Absolute: 0.1 10*3/uL (ref 0.0–0.7)
Eosinophils Relative: 1 %
HCT: 31.3 % — ABNORMAL LOW (ref 36.0–46.0)
Hemoglobin: 10.2 g/dL — ABNORMAL LOW (ref 12.0–15.0)
Lymphocytes Relative: 15 %
Lymphs Abs: 1 10*3/uL (ref 0.7–4.0)
MCH: 31.3 pg (ref 26.0–34.0)
MCHC: 32.6 g/dL (ref 30.0–36.0)
MCV: 96 fL (ref 78.0–100.0)
Monocytes Absolute: 0.6 10*3/uL (ref 0.1–1.0)
Monocytes Relative: 9 %
Neutro Abs: 4.8 10*3/uL (ref 1.7–7.7)
Neutrophils Relative %: 74 %
Platelets: 337 10*3/uL (ref 150–400)
RBC: 3.26 MIL/uL — ABNORMAL LOW (ref 3.87–5.11)
RDW: 15.8 % — ABNORMAL HIGH (ref 11.5–15.5)
WBC: 6.5 10*3/uL (ref 4.0–10.5)

## 2017-03-01 MED ORDER — HEPARIN SOD (PORK) LOCK FLUSH 100 UNIT/ML IV SOLN
500.0000 [IU] | Freq: Once | INTRAVENOUS | Status: AC
  Administered 2017-03-01: 500 [IU] via INTRAVENOUS

## 2017-03-01 MED ORDER — HEPARIN SOD (PORK) LOCK FLUSH 100 UNIT/ML IV SOLN
INTRAVENOUS | Status: AC
  Filled 2017-03-01: qty 5

## 2017-03-01 MED ORDER — GABAPENTIN 300 MG PO CAPS: 300.0000 mg | ORAL_CAPSULE | Freq: Every day | ORAL | 4 refills | Status: DC

## 2017-03-01 NOTE — Patient Instructions (Addendum)
Kimberly Roman at [facility] Discharge Instructions  RECOMMENDATIONS MADE BY THE CONSULTANT AND ANY TEST RESULTS WILL BE SENT TO YOUR REFERRING PHYSICIAN.  You were seen today by Mike Craze NP. Hold treatment today. Make sure you are taking your potassium 3 times a day. Rx for Gabapentin was given today, take one capsule daily at bedtime. Return next week for next treatment.    Thank you for choosing [place] at [facility] to provide your oncology and hematology care.  To afford each patient quality time with our provider, please arrive at least 15 minutes before your scheduled appointment time.    If you have a lab appointment with the [place] please come in thru the  Main Entrance and check in at the main information desk  You need to re-schedule your appointment should you arrive 10 or more minutes late.  We strive to give you quality time with our providers, and arriving late affects you and other patients whose appointments are after yours.  Also, if you no show three or more times for appointments you may be dismissed from the clinic at the providers discretion.     Again, thank you for choosing [facility].  Our hope is that these requests will decrease the amount of time that you wait before being seen by our physicians.       _____________________________________________________________  Should you have questions after your visit to [facility], please contact our office at [phone] between the hours of 8:30 a.m. and 4:30 p.m.  Voicemails left after 4:30 p.m. will not be returned until the following business day.  For prescription refill requests, have your pharmacy contact our office.       Resources For Cancer Patients and their Caregivers ? American Cancer Society: Can assist with transportation, wigs, general needs, runs Look Good Feel Better.        775-862-5550 ? Cancer  Care: Provides financial assistance, online support groups, medication/co-pay assistance.  1-800-813-HOPE 2363172479) ? [place] Assists [place] cancer patients and their families through emotional , educational and financial support.  [phone] ? [place] DSS Where to apply for food stamps, Medicaid and utility assistance. 216-392-6798 ? RCATS: Transportation to medical appointments. 4636280265 ? Social Security Administration: May apply for disability if have a Stage IV cancer. 907-500-3927 (463)307-6717 ? [employer], Disability and Transit Services: Assists with nutrition, care and transit needs. [place] Support Programs: @10RELATIVEDAYS @ > Cancer Support Group  2nd Tuesday of the month 1pm-2pm, Journey Room  > Creative Journey  3rd Tuesday of the month 1130am-1pm, Journey Room  > Look Good Feel Better  1st Wednesday of the month 10am-12 noon, Journey Room (Call [place] to register [phone])

## 2017-03-01 NOTE — Progress Notes (Signed)
Chemotherapy held for one week per MD. Follow up as scheduled.

## 2017-03-01 NOTE — Progress Notes (Signed)
[place], [place] [address]   CLINIC:  Medical Oncology/Hematology  PCP:  Rosita Fire, MD [place] [id number] [phone]   REASON FOR VISIT:  Follow-up for Stage IA (T1cN0M0) invasive ductal carcinoma of right breast; ER-/PR-/HER2-  CURRENT THERAPY: Adjuvant chemotherapy s/p Adriamycin/Cytoxan x 4; now Taxol weekly    BRIEF ONCOLOGIC HISTORY:    Invasive ductal carcinoma of breast ([place])   08/13/2016 Mammogram    BI-RADS CATEGORY  0: Incomplete.      08/21/2016 Mammogram    BI-RADS CATEGORY  5: Highly suggestive of malignancy. Suspicious 1.2 cm mass 10 o'clock position right breast.      08/21/2016 Breast US    Targeted ultrasound is performed, showing a hypoechoic irregular mass with indistinct margins at 10 o'clock position 9 cm from the nipple measuring approximately 1.1 x 1.2 x 1.0 cm. No additional masses are seen in this region of the right breast on ultrasound.      08/28/2016 Procedure    Right needle core biopsy      09/26/2016 Procedure    Right upper outer quadrant lumpectomy by Dr. Arnoldo Morale      09/28/2016 Pathology Results    Invasive ductal carcinoma, grade 3, standing 1.7 cm. High-grade ductal carcinoma in situ with comedonecrosis. Invasive carcinoma in less than 0.1 cm of the superior medial margin focally. In situ carcinoma is less than 0.1 cm of superior medial margin and the posterior margin focally. No LVI. ER negative. PR negative. HER-2 negative. KI-67 90%.      09/28/2016 Cancer Staging    Invasive ductal carcinoma of breast [facility])   Staging form: Breast, AJCC 7th Edition   - Clinical stage from 09/28/2016: Stage IA (T1c, N0, M0) - Signed by Baird Cancer, PA-C on 10/22/2016      09/29/2016 Pathology Results    Invasive ductal carcinoma, grade 2/3. HER-2 negative. ER negative. PR negative. Ki-67 90%.      10/24/2016 Echocardiogram    - Left ventricle: The cavity size was  normal. Wall thickness was   normal. Systolic function was normal. The estimated ejection   fraction was in the range of 60% to 65%. Wall motion was normal;   there were no regional wall motion abnormalities.       10/26/2016 Procedure    Port placed placed by Dr. Arnoldo Morale      11/02/2016 -  Chemotherapy    The patient had DOXOrubicin (ADRIAMYCIN) chemo injection 106 mg, 60 mg/m2 = 106 mg, Intravenous,  Once, 4 of 4 cycles  palonosetron (ALOXI) injection 0.25 mg, 0.25 mg, Intravenous,  Once, 4 of 4 cycles  pegfilgrastim (NEULASTA ONPRO KIT) injection 6 mg, 6 mg, Subcutaneous, Once, 4 of 4 cycles  cyclophosphamide (CYTOXAN) 1,060 mg in sodium chloride 0.9 % 250 mL chemo infusion, 600 mg/m2 = 1,060 mg, Intravenous,  Once, 4 of 4 cycles  fosaprepitant (EMEND) 150 mg, dexamethasone (DECADRON) 12 mg in sodium chloride 0.9 % 145 mL IVPB, , Intravenous,  Once, 4 of 4 cycles  PACLitaxel (TAXOL) 138 mg in dextrose 5 % 250 mL chemo infusion ( ondansetron (ZOFRAN) 8 mg in sodium chloride 0.9 % 50 mL IVPB, , Intravenous,  Once, 4 of 12 cycles  for chemotherapy treatment.          INTERVAL HISTORY:  Ms. Matkins presents for routine follow-up and her next cycle of Taxol.    She is due for cycle #10 today.  She tells  me she has felt really tired lately; she feels like her fatigue is getting worse each week. Also, her peripheral neuropathy has gotten significantly worse. "I've just given up on putting on my necklace every day because I can't get my fingers to do the clasp." She feels like bugs are crawling on her hands, feet, and her cheeks; this sensation has worsened in recent weeks as well.  When asked if she could rate her peripheral neuropathy symptoms on a 0-10 scale (0 being no symptoms at all; 10 being symptoms so severe I can't do any of my normal activities without numbness/tingling), she rated her symptoms 6-7/10.  The itching/"crawling feeling" is worse in her feet/legs at bedtime.   She  has not seen the radiation oncology team in [place] yet; she is not aware of any upcoming appt there for consultation.   Continues to have epiphora; remains on Claritin at bedtime and uses eye drops as well.   Otherwise, she is largely without complaints today.     REVIEW OF SYSTEMS:  Review of Systems  Constitutional: Negative for appetite change, chills and fever.  HENT:  Negative.  Negative for lump/mass and nosebleeds.   Eyes:       + Epiphora   Respiratory: Negative.  Negative for cough and shortness of breath.   Cardiovascular: Negative.  Negative for chest pain and leg swelling.  Gastrointestinal: Negative.  Negative for abdominal pain, blood in stool, constipation, diarrhea, nausea and vomiting.  Endocrine: Negative.   Genitourinary: Negative.  Negative for dysuria and hematuria.   Musculoskeletal: Positive for back pain. Negative for arthralgias.  Skin: Negative.  Negative for rash.       Nail darkening/changes since starting chemotherapy; endorses some nail pain as well, particularly to her right thumbnail.   Neurological: Positive for numbness (worsening peripheral neuropathy to fingertips and toes ). Negative for dizziness and headaches.  Hematological: Negative.  Negative for adenopathy. Does not bruise/bleed easily.  Psychiatric/Behavioral: Negative for depression and sleep disturbance. The patient is not nervous/anxious.      PAST MEDICAL/SURGICAL HISTORY:  Past Medical History:  Diagnosis Date  . Ankylosing spondylitis ([place])   . Arthritis   . Breast cancer ([place])   . Carpal tunnel syndrome   . Environmental allergies   . GERD (gastroesophageal reflux disease)   . Hyperlipemia   . Hypertension   . Invasive ductal carcinoma of breast ([place]) 10/22/2016   Past Surgical History:  Procedure Laterality Date  . ABDOMINAL HYSTERECTOMY    . CARPAL TUNNEL RELEASE Right 04/27/2013   Procedure: RIGHT CARPAL TUNNEL RELEASE;  Surgeon: Carole Civil, MD;  Location: [facility] ORS;   Service: Orthopedics;  Laterality: Right;  . COLONOSCOPY N/A 01/20/2013   Procedure: COLONOSCOPY;  Surgeon: Danie Binder, MD;  Location: [facility] ENDO SUITE;  Service: Endoscopy;  Laterality: N/A;  9:30 AM  . HEMATOMA EVACUATION Right 10/26/2016   Procedure: EVACUATION HEMATOMA;  Surgeon: Aviva Signs, MD;  Location: [facility] ORS;  Service: General;  Laterality: Right;  . PARTIAL MASTECTOMY WITH AXILLARY SENTINEL LYMPH NODE BIOPSY Right 09/26/2016   Procedure: PARTIAL MASTECTOMY WITH AXILLARY SENTINEL LYMPH NODE BIOPSY;  Surgeon: Aviva Signs, MD;  Location: [facility] ORS;  Service: General;  Laterality: Right;  . PORTACATH PLACEMENT Left 10/26/2016   Procedure: INSERTION PORT-A-CATH;  Surgeon: Aviva Signs, MD;  Location: [facility] ORS;  Service: General;  Laterality: Left;     SOCIAL HISTORY:  Social History   Social History  . Marital status: Single    Spouse  name: N/A  . Number of children: N/A  . Years of education: N/A   Occupational History  . Not on file.   Social History Main Topics  . Smoking status: Never Smoker  . Smokeless tobacco: Never Used  . Alcohol use No  . Drug use: No  . Sexual activity: Not on file   Other Topics Concern  . Not on file   Social History Narrative  . No narrative on file    FAMILY HISTORY:  Family History  Problem Relation Age of Onset  . Arthritis    . Hypertension Mother   . Hypertension Father   . Colon cancer Neg Hx     CURRENT MEDICATIONS:  Outpatient Encounter Prescriptions as of 03/01/2017  Medication Sig  . acetaminophen (TYLENOL) 650 MG CR tablet Take 650 mg by mouth every 8 (eight) hours as needed for pain.  Marland Kitchen amLODipine (NORVASC) 5 MG tablet Take 5 mg by mouth every morning.  . Artificial Tear Solution (SOOTHE XP) SOLN Apply 2 drops to eye 3 (three) times daily as needed (for dry/irritated eyes).   . diphenhydramine-acetaminophen (TYLENOL PM) 25-500 MG TABS tablet Take 1 tablet by mouth at bedtime as needed (for pain.).   Marland Kitchen lidocaine-prilocaine  (EMLA) cream   . lisinopril-hydrochlorothiazide (PRINZIDE,ZESTORETIC) 20-12.5 MG tablet Take 1 tablet by mouth every morning.  Marland Kitchen omeprazole (PRILOSEC) 20 MG capsule Take 20 mg by mouth every morning.   . ondansetron (ZOFRAN) 8 MG tablet   . potassium chloride SA (K-DUR,KLOR-CON) 20 MEQ tablet Take 2 tablets (40 mEq total) by mouth daily.  . sodium chloride (OCEAN) 0.65 % SOLN nasal spray Place 1 spray into both nostrils 4 (four) times daily as needed (for dry/congestion/allergies.).  Marland Kitchen gabapentin (NEURONTIN) 300 MG capsule Take 1 capsule (300 mg total) by mouth at bedtime.   No facility-administered encounter medications on file as of 03/01/2017.     ALLERGIES:  Allergies  Allergen Reactions  . Lipitor [Atorvastatin Calcium] Other (See Comments)    REACTION: Cramping in legs/lower extremties     PHYSICAL EXAM:  ECOG Performance status: 1 - Symptomatic, but independent.      Physical Exam  Constitutional: She is oriented to person, place, and time and well-developed, well-nourished, and in no distress.  HENT:  Head: Normocephalic.  Mouth/Throat: Oropharynx is clear and moist. No oropharyngeal exudate.  Eyes: Conjunctivae are normal. No scleral icterus.  + Epiphora noted (right worse than left)   Neck: Normal range of motion. Neck supple.  Cardiovascular: Normal rate, regular rhythm and normal heart sounds.   Pulmonary/Chest: Effort normal and breath sounds normal. No respiratory distress. She has no wheezes.  Abdominal: Soft. Bowel sounds are normal. There is no tenderness.  Musculoskeletal: Normal range of motion. She exhibits no edema.  Lymphadenopathy:    She has no cervical adenopathy.       Right: No supraclavicular adenopathy present.       Left: No supraclavicular adenopathy present.  Neurological: She is alert and oriented to person, place, and time. No cranial nerve deficit. Gait normal.  Skin: Skin is warm and dry. No rash noted.  -Nail darkening (expected finding  with chemotherapy); some nails with significant darkening/pain/raised area concerning for the nail falling off.   -Bilateral cheeks with mild pink areas.  Psychiatric: Mood, memory, affect and judgment normal.  Nursing note and vitals reviewed.    LABORATORY DATA:  I have reviewed the labs as listed.  CBC    Component Value Date/Time  WBC 6.5 03/01/2017 1047   RBC 3.26 (L) 03/01/2017 1047   HGB 10.2 (L) 03/01/2017 1047   HCT 31.3 (L) 03/01/2017 1047   PLT 337 03/01/2017 1047   MCV 96.0 03/01/2017 1047   MCH 31.3 03/01/2017 1047   MCHC 32.6 03/01/2017 1047   RDW 15.8 (H) 03/01/2017 1047   LYMPHSABS 1.0 03/01/2017 1047   MONOABS 0.6 03/01/2017 1047   EOSABS 0.1 03/01/2017 1047   BASOSABS 0.1 03/01/2017 1047   CMP Latest Ref Rng & Units 03/01/2017 02/21/2017 02/15/2017  Glucose 65 - 99 mg/dL 101(H) 96 96  BUN 6 - 20 mg/dL 8 8 8   Creatinine 0.44 - 1.00 mg/dL 0.59 0.62 0.52  Sodium 135 - 145 mmol/L 139 140 142  Potassium 3.5 - 5.1 mmol/L 3.3(L) 3.5 3.5  Chloride 101 - 111 mmol/L 105 104 106  CO2 22 - 32 mmol/L 29 30 30   Calcium 8.9 - 10.3 mg/dL 9.3 9.3 9.4  Total Protein 6.5 - 8.1 g/dL 6.6 6.5 6.6  Total Bilirubin 0.3 - 1.2 mg/dL 0.2(L) 0.6 0.3  Alkaline Phos 38 - 126 U/L 76 76 76  AST 15 - 41 U/L 21 18 21   ALT 14 - 54 U/L 20 19 21     PENDING LABS:    DIAGNOSTIC IMAGING:    PATHOLOGY:  Right breast lumpectomy surgical path: 09/26/16      ASSESSMENT & PLAN:   Stage IA (T1cN0M0) invasive ductal carcinoma of right breast; ER-/PR-/HER2-: -s/p lumpectomy and adjuvant chemotherapy with Adriamycin/Cytoxan x 4 cycles. Adjuvant chemo continues with Taxol weekly x 12 planned cycles. Discussed that she will likely need adjuvant radiation therapy after completing chemotherapy.  Referral placed for [facility] in [place] for radiation per patient preference.  -Labs reviewed. Due for cycle #10 Taxol today.  Discussed with Dr. Oliva Bustard; given progressive peripheral neuropathy, we  will hold Taxol this week.  We will try to treat neuropathy symptoms and have her return in 1 week for re-consideration for Taxol; may require dose-reduction if symptoms improve in 1 week's time. We also discussed that if her symptoms do not improve, then we may need to stop Taxol indefinitely. Reviewed that there is limited data that suggests that if we do not complete the full 12 cycles of chemotherapy that she will have a poorer prognosis/higher risk of recurrence; however, there is significant data to support permanent nerve damage with Taxane chemotherapy. She voiced understanding.  We will discuss further at next visit.  -Continue weekly Taxol. Return for follow-up visit and chemo in 2 weeks.   Worsening peripheral neuropathy likely secondary to chemotherapy:  -Progressive worsening of peripheral neuropathy, likely secondary to Taxol.  Neuropathy symptoms are impairing her daily life--she cannot put on her necklace with clasp, she has trouble with buttons, & she has trouble writing her name.  We will hold chemotherapy today as a result.  -Gabapentin 300 mg QHS e-scribed; explained the purpose of this medication in helping control her symptoms, which are worse at night.  Can consider adding daytime Gabapentin dose(s) if effective. Will re-evaluate next week.  -Consider dose-reduction of Taxol vs discontinuation of chemotherapy at next visit.   Hypokalemia:  -Mild hypokalemia noted today at 3.4. She is taking her prescribed KCl BID at present. Recommended she increase the oral KCl 20 mEq from BID to TID (to total 60 mEq/day) x 1 week. We will re-evaluate at follow-up visit next week.   Epiphora:  -Could be secondary to Taxol or seasonal allergies.  -Continue Claritin and  OTC eye drops, which are helpful for her.       Dispo:  -Return to cancer center next week for re-consideration of cycle #10 Taxol.     All questions were answered to patient's stated satisfaction. Encouraged patient to  call with any new concerns or questions before her next visit to the cancer center and we can certain see her sooner, if needed.    Plan of care discussed with Dr. Oliva Bustard, who agrees with the above aforementioned.    Orders placed this encounter:  No orders of the defined types were placed in this encounter.     Mike Craze, NP [place] [phone]

## 2017-03-04 ENCOUNTER — Encounter (HOSPITAL_COMMUNITY): Payer: Self-pay | Admitting: Lab

## 2017-03-04 NOTE — Progress Notes (Unsigned)
Referral sent to [facility]. Records faxed on 4/9

## 2017-03-08 ENCOUNTER — Encounter (HOSPITAL_COMMUNITY): Payer: Self-pay

## 2017-03-08 ENCOUNTER — Encounter (HOSPITAL_COMMUNITY): Payer: BLUE CROSS/BLUE SHIELD | Attending: Oncology

## 2017-03-08 DIAGNOSIS — C50411 Malignant neoplasm of upper-outer quadrant of right female breast: Secondary | ICD-10-CM | POA: Diagnosis not present

## 2017-03-08 DIAGNOSIS — E876 Hypokalemia: Secondary | ICD-10-CM

## 2017-03-08 DIAGNOSIS — C50911 Malignant neoplasm of unspecified site of right female breast: Secondary | ICD-10-CM | POA: Insufficient documentation

## 2017-03-08 LAB — CBC WITH DIFFERENTIAL/PLATELET
Basophils Absolute: 0 10*3/uL (ref 0.0–0.1)
Basophils Relative: 1 %
Eosinophils Absolute: 0.1 10*3/uL (ref 0.0–0.7)
Eosinophils Relative: 1 %
HCT: 31.9 % — ABNORMAL LOW (ref 36.0–46.0)
Hemoglobin: 10.3 g/dL — ABNORMAL LOW (ref 12.0–15.0)
Lymphocytes Relative: 17 %
Lymphs Abs: 1 10*3/uL (ref 0.7–4.0)
MCH: 30.7 pg (ref 26.0–34.0)
MCHC: 32.3 g/dL (ref 30.0–36.0)
MCV: 95.2 fL (ref 78.0–100.0)
Monocytes Absolute: 0.7 10*3/uL (ref 0.1–1.0)
Monocytes Relative: 11 %
Neutro Abs: 4.2 10*3/uL (ref 1.7–7.7)
Neutrophils Relative %: 70 %
Platelets: 294 10*3/uL (ref 150–400)
RBC: 3.35 MIL/uL — ABNORMAL LOW (ref 3.87–5.11)
RDW: 15.6 % — ABNORMAL HIGH (ref 11.5–15.5)
WBC: 5.9 10*3/uL (ref 4.0–10.5)

## 2017-03-08 LAB — COMPREHENSIVE METABOLIC PANEL
ALT: 19 U/L (ref 14–54)
AST: 17 U/L (ref 15–41)
Albumin: 3.8 g/dL (ref 3.5–5.0)
Alkaline Phosphatase: 77 U/L (ref 38–126)
Anion gap: 7 (ref 5–15)
BUN: 9 mg/dL (ref 6–20)
CO2: 28 mmol/L (ref 22–32)
Calcium: 9.2 mg/dL (ref 8.9–10.3)
Chloride: 102 mmol/L (ref 101–111)
Creatinine, Ser: 0.54 mg/dL (ref 0.44–1.00)
GFR calc Af Amer: >60 mL/min (ref >60)
GFR calc non Af Amer: >60 mL/min (ref >60)
Glucose, Bld: 101 mg/dL — ABNORMAL HIGH (ref 65–99)
Potassium: 3.4 mmol/L — ABNORMAL LOW (ref 3.5–5.1)
Sodium: 137 mmol/L (ref 135–145)
Total Bilirubin: 0.5 mg/dL (ref 0.3–1.2)
Total Protein: 6.5 g/dL (ref 6.5–8.1)

## 2017-03-08 MED ORDER — POTASSIUM CHLORIDE CRYS ER 20 MEQ PO TBCR: 40.0000 meq | EXTENDED_RELEASE_TABLET | Freq: Every day | ORAL | 0 refills | Status: DC

## 2017-03-08 MED ORDER — HEPARIN SOD (PORK) LOCK FLUSH 100 UNIT/ML IV SOLN
500.0000 [IU] | Freq: Once | INTRAVENOUS | Status: AC
  Administered 2017-03-08: 500 [IU] via INTRAVENOUS
  Filled 2017-03-08: qty 5

## 2017-03-08 MED ORDER — SODIUM CHLORIDE 0.9% FLUSH
20.0000 mL | INTRAVENOUS | Status: DC | PRN
  Administered 2017-03-08: 20 mL via INTRAVENOUS
  Filled 2017-03-08: qty 20

## 2017-03-08 NOTE — Progress Notes (Signed)
Kimberly Roman briefly seen in treatment area today.   She continues to have significant neuropathy; symptoms mildly improved, but are still present. She describes the sensation as "itching and crawling" to her hands and feet, which sounds consistent with neuropathy.  Gabapentin 300 mg at bedtime has helped significantly helped.    Discussed with Dr. Talbert Cage.  Given grade 3 peripheral neuropathy, we will stop Taxol.  She has completed 9 cycles thus far.  Discussed with patient that we do not want to cause permanent taxane-induced neuropathy.    She will proceed to breast radiation. She tells me she is seeing the radiation oncologist in [place] in about 2 weeks; she will have her CT sim the following day she states.    We will see her back for routine follow-up in mid to late July for continued surveillance.  Kimberly Roman agrees with this plan. I wished her well and encouraged her to "ring the bell" today to celebrate the completion of her chemotherapy!    Mike Craze, NP [place] [phone]   Addendum: Labs reviewed.  Potassium remains mildly low at 3.4. Her potassium will likely improve with the cessation of chemotherapy and improving appetite.  Will refill her oral KCl prescription and have her take BID as previously prescribed (she was taking TID for the past week). -gwd

## 2017-03-08 NOTE — Patient Instructions (Signed)
[place] at [facility] Discharge Instructions  RECOMMENDATIONS MADE BY THE CONSULTANT AND ANY TEST RESULTS WILL BE SENT TO YOUR REFERRING PHYSICIAN.  Chemo stopped today. Labs drawn from port then flushed per protocol  Thank you for choosing [place] at [facility] to provide your oncology and hematology care.  To afford each patient quality time with our provider, please arrive at least 15 minutes before your scheduled appointment time.    If you have a lab appointment with the [place] please come in thru the  Main Entrance and check in at the main information desk  You need to re-schedule your appointment should you arrive 10 or more minutes late.  We strive to give you quality time with our providers, and arriving late affects you and other patients whose appointments are after yours.  Also, if you no show three or more times for appointments you may be dismissed from the clinic at the providers discretion.     Again, thank you for choosing [facility].  Our hope is that these requests will decrease the amount of time that you wait before being seen by our physicians.       _____________________________________________________________  Should you have questions after your visit to [facility], please contact our office at [phone] between the hours of 8:30 a.m. and 4:30 p.m.  Voicemails left after 4:30 p.m. will not be returned until the following business day.  For prescription refill requests, have your pharmacy contact our office.       Resources For Cancer Patients and their Caregivers ? American Cancer Society: Can assist with transportation, wigs, general needs, runs Look Good Feel Better.        912-565-2163 ? Cancer Care: Provides financial assistance, online support groups, medication/co-pay assistance.  1-800-813-HOPE 6140978205) ? [place] Assists [place]  cancer patients and their families through emotional , educational and financial support.  2541653475 ? [place] DSS Where to apply for food stamps, Medicaid and utility assistance. 667-375-7786 ? RCATS: Transportation to medical appointments. (864)124-8623 ? Social Security Administration: May apply for disability if have a Stage IV cancer. (740)436-4926 (402)387-3102 ? [employer], Disability and Transit Services: Assists with nutrition, care and transit needs. [place] Support Programs: @10RELATIVEDAYS @ > Cancer Support Group  2nd Tuesday of the month 1pm-2pm, Journey Room  > Creative Journey  3rd Tuesday of the month 1130am-1pm, Journey Room  > Look Good Feel Better  1st Wednesday of the month 10am-12 noon, Journey Room (Call [place] to register [phone])

## 2017-03-08 NOTE — Progress Notes (Signed)
Kimberly Roman tolerated port lab draw with flush well without complaints or incident. Chemo stopped today per Dr Talbert Cage due to continued neuropathy,grade 3.Port accessed with blood drawn for labs then flushed with 20 ml NS and 5 ml Heparin easily per protocol and de-accessed.Lab results reviewed with Mike Craze NP and pt to continue her Potassium pills 20 meq BID. Pt verbalized understanding of this.Pt discharged self ambulatory in satisfactory condition accompanied by her mother. Pt instructed to continue to call for any problems and return for scheduled port flush. Pt verbalized understanding and "rang the bell" for completion of her chemotherapy

## 2017-03-15 ENCOUNTER — Ambulatory Visit (HOSPITAL_COMMUNITY): Payer: BLUE CROSS/BLUE SHIELD | Admitting: Oncology

## 2017-03-15 ENCOUNTER — Ambulatory Visit (HOSPITAL_COMMUNITY): Payer: BLUE CROSS/BLUE SHIELD | Admitting: Adult Health

## 2017-03-15 ENCOUNTER — Ambulatory Visit (HOSPITAL_COMMUNITY): Payer: BLUE CROSS/BLUE SHIELD

## 2017-03-22 ENCOUNTER — Encounter: Payer: Self-pay | Admitting: Genetic Counselor

## 2017-04-26 ENCOUNTER — Encounter (HOSPITAL_COMMUNITY): Payer: BLUE CROSS/BLUE SHIELD | Attending: Hematology & Oncology

## 2017-04-26 VITALS — BP 119/68 | HR 89 | Temp 98.0°F | Resp 20

## 2017-04-26 DIAGNOSIS — C50411 Malignant neoplasm of upper-outer quadrant of right female breast: Secondary | ICD-10-CM

## 2017-04-26 DIAGNOSIS — C50911 Malignant neoplasm of unspecified site of right female breast: Secondary | ICD-10-CM | POA: Insufficient documentation

## 2017-04-26 DIAGNOSIS — Z95828 Presence of other vascular implants and grafts: Secondary | ICD-10-CM

## 2017-04-26 DIAGNOSIS — Z452 Encounter for adjustment and management of vascular access device: Secondary | ICD-10-CM

## 2017-04-26 DIAGNOSIS — E876 Hypokalemia: Secondary | ICD-10-CM | POA: Insufficient documentation

## 2017-04-26 DIAGNOSIS — D72825 Bandemia: Secondary | ICD-10-CM | POA: Insufficient documentation

## 2017-04-26 MED ORDER — HEPARIN SOD (PORK) LOCK FLUSH 100 UNIT/ML IV SOLN
500.0000 [IU] | Freq: Once | INTRAVENOUS | Status: AC
  Administered 2017-04-26: 500 [IU] via INTRAVENOUS
  Filled 2017-04-26: qty 5

## 2017-04-26 NOTE — Progress Notes (Signed)
Kimberly Roman presented for Portacath access and flush. Portacath located lt chest wall accessed with  H 20 needle. Good blood return present. Portacath flushed with 1ml NS and 500U/20ml Heparin and needle removed intact. Procedure without incident. Patient tolerated procedure well.

## 2017-05-01 ENCOUNTER — Encounter (HOSPITAL_COMMUNITY): Payer: Self-pay | Admitting: Emergency Medicine

## 2017-05-01 ENCOUNTER — Emergency Department (HOSPITAL_COMMUNITY): Payer: BLUE CROSS/BLUE SHIELD

## 2017-05-01 ENCOUNTER — Emergency Department (HOSPITAL_COMMUNITY)
Admission: EM | Admit: 2017-05-01 | Discharge: 2017-05-01 | Disposition: A | Discharge status: Home / Self Care | Payer: BLUE CROSS/BLUE SHIELD | Attending: Emergency Medicine | Admitting: Emergency Medicine

## 2017-05-01 DIAGNOSIS — Z853 Personal history of malignant neoplasm of breast: Secondary | ICD-10-CM | POA: Insufficient documentation

## 2017-05-01 DIAGNOSIS — I10 Essential (primary) hypertension: Secondary | ICD-10-CM | POA: Insufficient documentation

## 2017-05-01 DIAGNOSIS — Z79899 Other long term (current) drug therapy: Secondary | ICD-10-CM | POA: Insufficient documentation

## 2017-05-01 DIAGNOSIS — N3000 Acute cystitis without hematuria: Secondary | ICD-10-CM

## 2017-05-01 DIAGNOSIS — R509 Fever, unspecified: Secondary | ICD-10-CM | POA: Diagnosis present

## 2017-05-01 LAB — CBC WITH DIFFERENTIAL/PLATELET
Basophils Absolute: 0 10*3/uL (ref 0.0–0.1)
Basophils Relative: 0 %
Eosinophils Absolute: 0.1 10*3/uL (ref 0.0–0.7)
Eosinophils Relative: 1 %
HCT: 29 % — ABNORMAL LOW (ref 36.0–46.0)
Hemoglobin: 9.8 g/dL — ABNORMAL LOW (ref 12.0–15.0)
Lymphocytes Relative: 5 %
Lymphs Abs: 0.5 10*3/uL — ABNORMAL LOW (ref 0.7–4.0)
MCH: 29.2 pg (ref 26.0–34.0)
MCHC: 33.8 g/dL (ref 30.0–36.0)
MCV: 86.3 fL (ref 78.0–100.0)
Monocytes Absolute: 1.1 10*3/uL — ABNORMAL HIGH (ref 0.1–1.0)
Monocytes Relative: 12 %
Neutro Abs: 7.3 10*3/uL (ref 1.7–7.7)
Neutrophils Relative %: 82 %
Platelets: 311 10*3/uL (ref 150–400)
RBC: 3.36 MIL/uL — ABNORMAL LOW (ref 3.87–5.11)
RDW: 14.6 % (ref 11.5–15.5)
WBC: 9 10*3/uL (ref 4.0–10.5)

## 2017-05-01 LAB — COMPREHENSIVE METABOLIC PANEL
ALT: 15 U/L (ref 14–54)
AST: 16 U/L (ref 15–41)
Albumin: 3.8 g/dL (ref 3.5–5.0)
Alkaline Phosphatase: 78 U/L (ref 38–126)
Anion gap: 9 (ref 5–15)
BUN: 25 mg/dL — ABNORMAL HIGH (ref 6–20)
CO2: 23 mmol/L (ref 22–32)
Calcium: 9.6 mg/dL (ref 8.9–10.3)
Chloride: 101 mmol/L (ref 101–111)
Creatinine, Ser: 1.65 mg/dL — ABNORMAL HIGH (ref 0.44–1.00)
GFR calc Af Amer: 38 mL/min — ABNORMAL LOW (ref >60)
GFR calc non Af Amer: 33 mL/min — ABNORMAL LOW (ref >60)
Glucose, Bld: 113 mg/dL — ABNORMAL HIGH (ref 65–99)
Potassium: 3.6 mmol/L (ref 3.5–5.1)
Sodium: 133 mmol/L — ABNORMAL LOW (ref 135–145)
Total Bilirubin: 0.6 mg/dL (ref 0.3–1.2)
Total Protein: 7.9 g/dL (ref 6.5–8.1)

## 2017-05-01 LAB — URINALYSIS, ROUTINE W REFLEX MICROSCOPIC
Bacteria, UA: NONE SEEN
Bilirubin Urine: NEGATIVE
Glucose, UA: NEGATIVE mg/dL
Ketones, ur: NEGATIVE mg/dL
Nitrite: NEGATIVE
Protein, ur: NEGATIVE mg/dL
Specific Gravity, Urine: 1.002 — ABNORMAL LOW (ref 1.005–1.030)
pH: 6 (ref 5.0–8.0)

## 2017-05-01 MED ORDER — CEPHALEXIN 500 MG PO CAPS
500.0000 mg | ORAL_CAPSULE | Freq: Once | ORAL | Status: AC
  Administered 2017-05-01: 500 mg via ORAL
  Filled 2017-05-01: qty 1

## 2017-05-01 MED ORDER — CEPHALEXIN 500 MG PO CAPS: 500.0000 mg | ORAL_CAPSULE | Freq: Four times a day (QID) | ORAL | 0 refills | Status: DC

## 2017-05-01 NOTE — Discharge Instructions (Signed)
Follow up with your md next week. °

## 2017-05-01 NOTE — ED Provider Notes (Signed)
[facility] DEPT Provider Note   CSN: [id number] Arrival date & time: 05/01/17  1148     History   Chief Complaint Chief Complaint  Patient presents with  . Fever    HPI Kimberly Roman is a 61 y.o. female.  Patient complains of fever for a few days.   The history is provided by the patient.  Fever   This is a new problem. The current episode started more than 2 days ago. The problem occurs constantly. The problem has not changed since onset.The maximum temperature noted was 99 to 99.9 F. Pertinent negatives include no chest pain, no diarrhea, no congestion, no headaches and no cough. She has tried nothing for the symptoms. The treatment provided no relief.    Past Medical History:  Diagnosis Date  . Ankylosing spondylitis ([place])   . Arthritis   . Breast cancer ([place])   . Carpal tunnel syndrome   . Environmental allergies   . GERD (gastroesophageal reflux disease)   . Hyperlipemia   . Hypertension   . Invasive ductal carcinoma of breast ([place]) 10/22/2016    Patient Active Problem List   Diagnosis Date Noted  . Ankylosing spondylitis ([place]) 11/17/2016  . GERD (gastroesophageal reflux disease) 11/17/2016  . Invasive ductal carcinoma of breast ([place]) 10/22/2016    Past Surgical History:  Procedure Laterality Date  . ABDOMINAL HYSTERECTOMY    . CARPAL TUNNEL RELEASE Right 04/27/2013   Procedure: RIGHT CARPAL TUNNEL RELEASE;  Surgeon: Carole Civil, MD;  Location: [facility] ORS;  Service: Orthopedics;  Laterality: Right;  . COLONOSCOPY N/A 01/20/2013   Procedure: COLONOSCOPY;  Surgeon: Danie Binder, MD;  Location: [facility] END[facility] SUITE;  Service: Endoscopy;  Laterality: N/A;  9:30 AM  . HEMATOMA EVACUATION Right 10/26/2016   Procedure: EVACUATION HEMATOMA;  Surgeon: Aviva Signs, MD;  Location: [facility];  Service: General;  Laterality: Right;  . PARTIAL MASTECTOMY WITH AXILLARY SENTINEL LYMPH NODE BIOPSY Right 09/26/2016   Procedure: PARTIAL MASTECTOMY WITH AXILLARY SENTINEL  LYMPH NODE BIOPSY;  Surgeon: Aviva Signs, MD;  Location: [facility];  Service: General;  Laterality: Right;  . PORTACATH PLACEMENT Left 10/26/2016   Procedure: INSERTION PORT-A-CATH;  Surgeon: Aviva Signs, MD;  Location: [facility];  Service: General;  Laterality: Left;    OB History    No data available       Home Medications    Prior to Admission medications   Medication Sig Start Date End Date Taking? Authorizing Provider  acetaminophen (TYLENOL) 650 MG CR tablet Take 650 mg by mouth every 8 (eight) hours as needed for pain.   Yes [provider]  amLODipine (NORVASC) 5 MG tablet Take 5 mg by mouth every morning.   Yes [provider]  Artificial Tear Solution (SOOTHE XP) SOLN Apply 2 drops to eye 3 (three) times daily as needed (for dry/irritated eyes).    Yes [provider]  diphenhydramine-acetaminophen (TYLENOL PM) 25-500 MG TABS tablet Take 1 tablet by mouth at bedtime as needed (for pain.).    Yes [provider]  lisinopril-hydrochlorothiazide (PRINZIDE,ZESTORETIC) 20-12.5 MG tablet Take 1 tablet by mouth every morning. 09/13/16  Yes [provider]  omeprazole (PRILOSEC) 20 MG capsule Take 20 mg by mouth every morning.  08/31/16  Yes [provider]  potassium chloride SA (K-DUR,KLOR-CON) 20 MEQ tablet Take 2 tablets (40 mEq total) by mouth daily. 03/08/17  Yes Holley Bouche, NP  sodium chloride (OCEAN) 0.65 % SOLN nasal spray Place 1 spray into both  nostrils 4 (four) times daily as needed (for dry/congestion/allergies.).   Yes [provider]  cephALEXin (KEFLEX) 500 MG capsule Take 1 capsule (500 mg total) by mouth 4 (four) times daily. 05/01/17   Milton Ferguson, MD  gabapentin (NEURONTIN) 300 MG capsule Take 1 capsule (300 mg total) by mouth at bedtime. Patient not taking: Reported on 04/26/2017 03/01/17   Holley Bouche, NP    Family History Family History  Problem Relation Age of Onset  . Arthritis Unknown   .  Hypertension Mother   . Hypertension Father   . Colon cancer Neg Hx     Social History Social History  Substance Use Topics  . Smoking status: Never Smoker  . Smokeless tobacco: Never Used  . Alcohol use No     Allergies   Atorvastatin and Lipitor [atorvastatin calcium]   Review of Systems Review of Systems  Constitutional: Positive for fever. Negative for appetite change and fatigue.  HENT: Negative for congestion, ear discharge and sinus pressure.   Eyes: Negative for discharge.  Respiratory: Negative for cough.   Cardiovascular: Negative for chest pain.  Gastrointestinal: Negative for abdominal pain and diarrhea.  Genitourinary: Negative for frequency and hematuria.  Musculoskeletal: Negative for back pain.  Skin: Negative for rash.  Neurological: Negative for seizures and headaches.  Psychiatric/Behavioral: Negative for hallucinations.     Physical Exam Updated Vital Signs BP 120/61   Pulse 88   Temp 98.5 F (36.9 C) (Oral)   Resp 16   Ht 5\' 5"  (1.651 m)   Wt 67.1 kg (148 lb)   SpO2 99%   BMI 24.63 kg/m   Physical Exam  Constitutional: She is oriented to person, place, and time. She appears well-developed.  HENT:  Head: Normocephalic.  Eyes: Conjunctivae and EOM are normal. No scleral icterus.  Neck: Neck supple. No thyromegaly present.  Cardiovascular: Normal rate and regular rhythm.  Exam reveals no gallop and no friction rub.   No murmur heard. Pulmonary/Chest: No stridor. She has no wheezes. She has no rales. She exhibits no tenderness.  Abdominal: She exhibits no distension. There is no tenderness. There is no rebound.  Musculoskeletal: Normal range of motion. She exhibits no edema.  Lymphadenopathy:    She has no cervical adenopathy.  Neurological: She is oriented to person, place, and time. She exhibits normal muscle tone. Coordination normal.  Skin: No rash noted. No erythema.  Psychiatric: She has a normal mood and affect. Her behavior is  normal.     ED Treatments / Results  Labs (all labs ordered are listed, but only abnormal results are displayed) Labs Reviewed  CBC WITH DIFFERENTIAL/PLATELET - Abnormal; Notable for the following:       Result Value   RBC 3.36 (*)    Hemoglobin 9.8 (*)    HCT 29.0 (*)    Lymphs Abs 0.5 (*)    Monocytes Absolute 1.1 (*)    All other components within normal limits  COMPREHENSIVE METABOLIC PANEL - Abnormal; Notable for the following:    Sodium 133 (*)    Glucose, Bld 113 (*)    BUN 25 (*)    Creatinine, Ser 1.65 (*)    GFR calc non Af Amer 33 (*)    GFR calc Af Amer 38 (*)    All other components within normal limits  URINALYSIS, ROUTINE W REFLEX MICROSCOPIC - Abnormal; Notable for the following:    Color, Urine STRAW (*)    Specific Gravity, Urine 1.002 (*)  Hgb urine dipstick SMALL (*)    Leukocytes, UA MODERATE (*)    Squamous Epithelial / LPF 0-5 (*)    All other components within normal limits  URINE CULTURE    EKG  EKG Interpretation None       Radiology Dg Chest 2 View  Result Date: 05/01/2017 CLINICAL DATA:  Fever. EXAM: CHEST  2 VIEW COMPARISON:  10/26/2016 FINDINGS: The cardiopericardial silhouette is within normal limits for size. The lungs are clear wiithout focal pneumonia, edema, pneumothorax or pleural effusion. Linear atelectasis or scarring at the bases Left Port-A-Cath tip projects proximal SVC level. The visualized bony structures of the thorax are intact. IMPRESSION: No active cardiopulmonary disease. Electronically Signed   By: Misty Stanley M.D.   On: 05/01/2017 13:18    Procedures Procedures (including critical care time)  Medications Ordered in ED Medications  cephALEXin (KEFLEX) capsule 500 mg (not administered)     Initial Impression / Assessment and Plan / ED Course  I have reviewed the triage vital signs and the nursing notes.  Pertinent labs & imaging results that were available during my care of the patient were reviewed by me  and considered in my medical decision making (see chart for details).     Patient with urinary tract infection. She'll be put on Keflex. She will follow-up with her PCP next week Final Clinical Impressions(s) / ED Diagnoses   Final diagnoses:  Acute cystitis without hematuria    New Prescriptions New Prescriptions   CEPHALEXIN (KEFLEX) 500 MG CAPSULE    Take 1 capsule (500 mg total) by mouth 4 (four) times daily.     Milton Ferguson, MD 05/01/17 1504

## 2017-05-01 NOTE — ED Triage Notes (Signed)
Pt comes in for temperature reading 99's for the last week. States she began feeling nausea and tired. Has pain to her lower back, denies any urinary problems. Currently had breast cancer, recently finished radiation tx, no chemo tx.

## 2017-05-03 LAB — URINE CULTURE: Culture: <10000 — AB

## 2017-05-24 ENCOUNTER — Other Ambulatory Visit (HOSPITAL_COMMUNITY): Payer: Self-pay | Admitting: Adult Health

## 2017-05-24 DIAGNOSIS — E876 Hypokalemia: Secondary | ICD-10-CM

## 2017-06-12 ENCOUNTER — Other Ambulatory Visit (HOSPITAL_COMMUNITY): Payer: Self-pay | Admitting: Adult Health

## 2017-06-12 ENCOUNTER — Ambulatory Visit (HOSPITAL_COMMUNITY): Payer: BLUE CROSS/BLUE SHIELD | Admitting: Adult Health

## 2017-06-12 ENCOUNTER — Encounter (HOSPITAL_COMMUNITY): Payer: BLUE CROSS/BLUE SHIELD | Attending: Hematology & Oncology

## 2017-06-12 ENCOUNTER — Encounter (HOSPITAL_BASED_OUTPATIENT_CLINIC_OR_DEPARTMENT_OTHER): Payer: BLUE CROSS/BLUE SHIELD | Admitting: Adult Health

## 2017-06-12 ENCOUNTER — Encounter (HOSPITAL_COMMUNITY): Payer: Self-pay | Admitting: Adult Health

## 2017-06-12 VITALS — BP 128/70 | HR 103 | Resp 16 | Ht 65.0 in | Wt 149.7 lb

## 2017-06-12 DIAGNOSIS — D72825 Bandemia: Secondary | ICD-10-CM | POA: Diagnosis present

## 2017-06-12 DIAGNOSIS — Z9221 Personal history of antineoplastic chemotherapy: Secondary | ICD-10-CM

## 2017-06-12 DIAGNOSIS — C50911 Malignant neoplasm of unspecified site of right female breast: Secondary | ICD-10-CM

## 2017-06-12 DIAGNOSIS — C50411 Malignant neoplasm of upper-outer quadrant of right female breast: Secondary | ICD-10-CM

## 2017-06-12 DIAGNOSIS — E876 Hypokalemia: Secondary | ICD-10-CM | POA: Insufficient documentation

## 2017-06-12 DIAGNOSIS — G629 Polyneuropathy, unspecified: Secondary | ICD-10-CM | POA: Diagnosis not present

## 2017-06-12 DIAGNOSIS — Z171 Estrogen receptor negative status [ER-]: Secondary | ICD-10-CM | POA: Diagnosis not present

## 2017-06-12 DIAGNOSIS — D649 Anemia, unspecified: Secondary | ICD-10-CM

## 2017-06-12 DIAGNOSIS — Z923 Personal history of irradiation: Secondary | ICD-10-CM

## 2017-06-12 LAB — COMPREHENSIVE METABOLIC PANEL
ALT: 15 U/L (ref 14–54)
AST: 18 U/L (ref 15–41)
Albumin: 3.8 g/dL (ref 3.5–5.0)
Alkaline Phosphatase: 83 U/L (ref 38–126)
Anion gap: 7 (ref 5–15)
BUN: 17 mg/dL (ref 6–20)
CO2: 23 mmol/L (ref 22–32)
Calcium: 9.6 mg/dL (ref 8.9–10.3)
Chloride: 108 mmol/L (ref 101–111)
Creatinine, Ser: 1.33 mg/dL — ABNORMAL HIGH (ref 0.44–1.00)
GFR calc Af Amer: 49 mL/min — ABNORMAL LOW (ref >60)
GFR calc non Af Amer: 42 mL/min — ABNORMAL LOW (ref >60)
Glucose, Bld: 97 mg/dL (ref 65–99)
Potassium: 4.3 mmol/L (ref 3.5–5.1)
Sodium: 138 mmol/L (ref 135–145)
Total Bilirubin: 0.3 mg/dL (ref 0.3–1.2)
Total Protein: 8.1 g/dL (ref 6.5–8.1)

## 2017-06-12 LAB — CBC WITH DIFFERENTIAL/PLATELET
Basophils Absolute: 0.1 10*3/uL (ref 0.0–0.1)
Basophils Relative: 1 %
Eosinophils Absolute: 0.1 10*3/uL (ref 0.0–0.7)
Eosinophils Relative: 2 %
HCT: 24.2 % — ABNORMAL LOW (ref 36.0–46.0)
Hemoglobin: 7.9 g/dL — ABNORMAL LOW (ref 12.0–15.0)
Lymphocytes Relative: 12 %
Lymphs Abs: 0.9 10*3/uL (ref 0.7–4.0)
MCH: 28.8 pg (ref 26.0–34.0)
MCHC: 32.6 g/dL (ref 30.0–36.0)
MCV: 88.3 fL (ref 78.0–100.0)
Monocytes Absolute: 0.7 10*3/uL (ref 0.1–1.0)
Monocytes Relative: 10 %
Neutro Abs: 5.2 10*3/uL (ref 1.7–7.7)
Neutrophils Relative %: 75 %
Platelets: 316 10*3/uL (ref 150–400)
RBC: 2.74 MIL/uL — ABNORMAL LOW (ref 3.87–5.11)
RDW: 16.4 % — ABNORMAL HIGH (ref 11.5–15.5)
WBC: 6.9 10*3/uL (ref 4.0–10.5)

## 2017-06-12 MED ORDER — HEPARIN SOD (PORK) LOCK FLUSH 100 UNIT/ML IV SOLN
500.0000 [IU] | Freq: Once | INTRAVENOUS | Status: AC
  Administered 2017-06-12: 500 [IU] via INTRAVENOUS

## 2017-06-12 MED ORDER — SODIUM CHLORIDE 0.9% FLUSH
10.0000 mL | INTRAVENOUS | Status: DC | PRN
  Administered 2017-06-12: 10 mL via INTRAVENOUS
  Filled 2017-06-12: qty 10

## 2017-06-12 NOTE — Patient Instructions (Signed)
[place] at [facility] Discharge Instructions  RECOMMENDATIONS MADE BY THE CONSULTANT AND ANY TEST RESULTS WILL BE SENT TO YOUR REFERRING PHYSICIAN.  You were seen today by Mike Craze NP. Your mammogram is due in November of this year. Return to the clinic in November for a follow up.    Thank you for choosing [place] at [facility] to provide your oncology and hematology care.  To afford each patient quality time with our provider, please arrive at least 15 minutes before your scheduled appointment time.    If you have a lab appointment with the [facility] please come in thru the  Main Entrance and check in at the main information desk  You need to re-schedule your appointment should you arrive 10 or more minutes late.  We strive to give you quality time with our providers, and arriving late affects you and other patients whose appointments are after yours.  Also, if you no show three or more times for appointments you may be dismissed from the clinic at the providers discretion.     Again, thank you for choosing [facility].  Our hope is that these requests will decrease the amount of time that you wait before being seen by our physicians.       _____________________________________________________________  Should you have questions after your visit to [facility], please contact our office at [phone] between the hours of 8:30 a.m. and 4:30 p.m.  Voicemails left after 4:30 p.m. will not be returned until the following business day.  For prescription refill requests, have your pharmacy contact our office.       Resources For Cancer Patients and their Caregivers ? American Cancer Society: Can assist with transportation, wigs, general needs, runs Look Good Feel Better.        (828)737-9389 ? Cancer Care: Provides financial assistance, online support groups, medication/co-pay assistance.   1-800-813-HOPE 825 671 1622) ? [place] Assists [place] cancer patients and their families through emotional , educational and financial support.  360-537-0324 ? [place] DSS Where to apply for food stamps, Medicaid and utility assistance. (737) 389-8949 ? [employer]: Transportation to medical appointments. 4355377746 ? Social Security Administration: May apply for disability if have a Stage IV cancer. (843)459-9174 825-814-6112 ? [employer], Disability and Transit Services: Assists with nutrition, care and transit needs. [place] Support Programs: @10RELATIVEDAYS @ > Cancer Support Group  2nd Tuesday of the month 1pm-2pm, Journey Room  > Creative Journey  3rd Tuesday of the month 1130am-1pm, Journey Room  > Look Good Feel Better  1st Wednesday of the month 10am-12 noon, Journey Room (Call [place] to register [phone])

## 2017-06-12 NOTE — Progress Notes (Signed)
Kimberly Roman presented for Portacath access and flush. Portacath located left chest wall accessed with  H 20 needle. Good blood return present. Portacath flushed with 75ml NS and 500U/31ml Heparin and needle removed intact. Procedure without incident. Patient tolerated procedure well.

## 2017-06-12 NOTE — Progress Notes (Signed)
[place], [place] [address]   CLINIC:  Medical Oncology/Hematology  PCP:  Rosita Fire, MD [place] [id number] [phone]   REASON FOR VISIT:  Follow-up for Stage IA (T1cN0M0) invasive ductal carcinoma of right breast; ER-/PR-/HER2-  CURRENT THERAPY: Surveillance per NCCN Guidelines    BRIEF ONCOLOGIC HISTORY:    Invasive ductal carcinoma of breast ([place])   08/13/2016 Mammogram    BI-RADS CATEGORY  0: Incomplete.      08/21/2016 Mammogram    BI-RADS CATEGORY  5: Highly suggestive of malignancy. Suspicious 1.2 cm mass 10 o'clock position right breast.      08/21/2016 Breast US    Targeted ultrasound is performed, showing a hypoechoic irregular mass with indistinct margins at 10 o'clock position 9 cm from the nipple measuring approximately 1.1 x 1.2 x 1.0 cm. No additional masses are seen in this region of the right breast on ultrasound.      08/28/2016 Procedure    Right needle core biopsy      09/26/2016 Procedure    Right upper outer quadrant lumpectomy by Dr. Arnoldo Morale      09/28/2016 Pathology Results    Invasive ductal carcinoma, grade 3, standing 1.7 cm. High-grade ductal carcinoma in situ with comedonecrosis. Invasive carcinoma in less than 0.1 cm of the superior medial margin focally. In situ carcinoma is less than 0.1 cm of superior medial margin and the posterior margin focally. No LVI. ER negative. PR negative. HER-2 negative. KI-67 90%.      09/28/2016 Cancer Staging    Invasive ductal carcinoma of breast [facility])   Staging form: Breast, AJCC 7th Edition   - Clinical stage from 09/28/2016: Stage IA (T1c, N0, M0) - Signed by Baird Cancer, PA-C on 10/22/2016      09/29/2016 Pathology Results    Invasive ductal carcinoma, grade 2/3. HER-2 negative. ER negative. PR negative. Ki-67 90%.      10/24/2016 Echocardiogram    - Left ventricle: The cavity size was normal. Wall thickness was   normal.  Systolic function was normal. The estimated ejection   fraction was in the range of 60% to 65%. Wall motion was normal;   there were no regional wall motion abnormalities.       10/26/2016 Procedure    Port placed placed by Dr. Arnoldo Morale      11/02/2016 -  Chemotherapy    The patient had DOXOrubicin (ADRIAMYCIN) chemo injection 106 mg, 60 mg/m2 = 106 mg, Intravenous,  Once, 4 of 4 cycles  palonosetron (ALOXI) injection 0.25 mg, 0.25 mg, Intravenous,  Once, 4 of 4 cycles  pegfilgrastim (NEULASTA ONPRO KIT) injection 6 mg, 6 mg, Subcutaneous, Once, 4 of 4 cycles  cyclophosphamide (CYTOXAN) 1,060 mg in sodium chloride 0.9 % 250 mL chemo infusion, 600 mg/m2 = 1,060 mg, Intravenous,  Once, 4 of 4 cycles  fosaprepitant (EMEND) 150 mg, dexamethasone (DECADRON) 12 mg in sodium chloride 0.9 % 145 mL IVPB, , Intravenous,  Once, 4 of 4 cycles  PACLitaxel (TAXOL) 138 mg in dextrose 5 % 250 mL chemo infusion ( ondansetron (ZOFRAN) 8 mg in sodium chloride 0.9 % 50 mL IVPB, , Intravenous,  Once, 4 of 12 cycles  for chemotherapy treatment.        03/28/2017 - 04/26/2017 Radiation Therapy    Adjuvant breast radiation [facility]). Right breast 3D CRT 6x photons 16 fractions at 2.65 GY/f to 42.4 Gy TB boost 3 field 6X photons 4 fractions  at 2.5 Gy/f to 10 GY             INTERVAL HISTORY:  Ms. Ballowe presents for routine follow-up for history of right breast cancer.   She completed radiation therapy to her right breast on 04/26/17. States that the radiation "was so hard on me. I didn't think the chemo was that bad, but the radiation was horrible. It just completely took all of my energy!"  Reports that she had episodes of dizziness and N&V during her radiation course; she states that her BP meds were stopped because they thought they were contributing to some of her symptoms.  She is continuing to recover from radiation, but still feels very tired.  States that she has had to take frequent rest breaks when  doing her housework.  Energy levels are about 25%.  States that she has no appetite; appetite is slowly improving and "I make myself eat because I know I have to eat, but I don't get hungry."  Chart reviewed; weight is down ~3 lbs in the past 4 months. Bowels are moving well; no blood in her stools or dark/tarry stools.   Chart reviewed; she did have ED visit on 05/01/17 for fever and was found to have UTI; she was given a prescription for Keflex and discharged home in stable condition.  Her symptoms have resolved since that time.   Peripheral neuropathy is improved since completing chemotherapy. She stopped taking the gabapentin as a result.  Reports that she has occasional "face twitches" where muscles in her face will (cheeks) will "jump" and then resolve spontaneously. She has remaining neuropathy to her right index finger only.      REVIEW OF SYSTEMS:  Review of Systems  Constitutional: Positive for appetite change and fatigue. Negative for fever.  HENT:  Negative.   Eyes: Negative.   Respiratory: Negative.  Negative for cough and shortness of breath.   Cardiovascular: Negative.  Negative for leg swelling.  Gastrointestinal: Negative.  Negative for abdominal pain, blood in stool, constipation, diarrhea, nausea and vomiting.  Endocrine: Negative.   Genitourinary: Negative.  Negative for dysuria and hematuria.   Musculoskeletal: Negative.   Skin: Negative.  Negative for rash.  Neurological: Positive for numbness.  Hematological: Negative.   Psychiatric/Behavioral: Negative.      PAST MEDICAL/SURGICAL HISTORY:  Past Medical History:  Diagnosis Date  . Ankylosing spondylitis (HCC)   . Arthritis   . Breast cancer (HCC)   . Carpal tunnel syndrome   . Environmental allergies   . GERD (gastroesophageal reflux disease)   . Hyperlipemia   . Hypertension   . Invasive ductal carcinoma of breast (HCC) 10/22/2016   Past Surgical History:  Procedure Laterality Date  . ABDOMINAL  HYSTERECTOMY    . CARPAL TUNNEL RELEASE Right 04/27/2013   Procedure: RIGHT CARPAL TUNNEL RELEASE;  Surgeon: Vickki Hearing, MD;  Location: [facility];  Service: Orthopedics;  Laterality: Right;  . COLONOSCOPY N/A 01/20/2013   Procedure: COLONOSCOPY;  Surgeon: West Bali, MD;  Location: [facility];  Service: Endoscopy;  Laterality: N/A;  9:30 AM  . HEMATOMA EVACUATION Right 10/26/2016   Procedure: EVACUATION HEMATOMA;  Surgeon: Franky Macho, MD;  Location: [facility];  Service: General;  Laterality: Right;  . PARTIAL MASTECTOMY WITH AXILLARY SENTINEL LYMPH NODE BIOPSY Right 09/26/2016   Procedure: PARTIAL MASTECTOMY WITH AXILLARY SENTINEL LYMPH NODE BIOPSY;  Surgeon: Franky Macho, MD;  Location: [facility];  Service: General;  Laterality: Right;  . PORTACATH PLACEMENT Left 10/26/2016  Procedure: INSERTION PORT-A-CATH;  Surgeon: Aviva Signs, MD;  Location: [facility];  Service: General;  Laterality: Left;     SOCIAL HISTORY:  Social History   Social History  . Marital status: Single    Spouse name: N/A  . Number of children: N/A  . Years of education: N/A   Occupational History  . Not on file.   Social History Main Topics  . Smoking status: Never Smoker  . Smokeless tobacco: Never Used  . Alcohol use No  . Drug use: No  . Sexual activity: Not on file   Other Topics Concern  . Not on file   Social History Narrative  . No narrative on file    FAMILY HISTORY:  Family History  Problem Relation Age of Onset  . Arthritis Unknown   . Hypertension Mother   . Hypertension Father   . Colon cancer Neg Hx     CURRENT MEDICATIONS:  Outpatient Encounter Prescriptions as of 06/12/2017  Medication Sig  . acetaminophen (TYLENOL) 650 MG CR tablet Take 650 mg by mouth every 8 (eight) hours as needed for pain.  . Artificial Tear Solution (SOOTHE XP) SOLN Apply 2 drops to eye 3 (three) times daily as needed (for dry/irritated eyes).   Marland Kitchen omeprazole (PRILOSEC) 20 MG capsule Take 20 mg by mouth  every morning.   . sodium chloride (OCEAN) 0.65 % SOLN nasal spray Place 1 spray into both nostrils 4 (four) times daily as needed (for dry/congestion/allergies.).  . [DISCONTINUED] amLODipine (NORVASC) 5 MG tablet Take 5 mg by mouth every morning.  . [DISCONTINUED] cephALEXin (KEFLEX) 500 MG capsule Take 1 capsule (500 mg total) by mouth 4 (four) times daily.  . [DISCONTINUED] diphenhydramine-acetaminophen (TYLENOL PM) 25-500 MG TABS tablet Take 1 tablet by mouth at bedtime as needed (for pain.).   . [DISCONTINUED] gabapentin (NEURONTIN) 300 MG capsule Take 1 capsule (300 mg total) by mouth at bedtime. (Patient not taking: Reported on 04/26/2017)  . [DISCONTINUED] lisinopril-hydrochlorothiazide (PRINZIDE,ZESTORETIC) 20-12.5 MG tablet Take 1 tablet by mouth every morning.  . [DISCONTINUED] potassium chloride SA (K-DUR,KLOR-CON) 20 MEQ tablet TAKE 2 TABLETS BY MOUTH DAILY.   No facility-administered encounter medications on file as of 06/12/2017.     ALLERGIES:  Allergies  Allergen Reactions  . Atorvastatin     Other reaction(s): Other (See Comments) REACTION: Cramping in legs/lower extremties  . Lipitor [Atorvastatin Calcium] Other (See Comments)    REACTION: Cramping in legs/lower extremties     PHYSICAL EXAM:  ECOG Performance status: 1 - Symptomatic, but independent.   Vitals:   06/12/17 1113  BP: 128/70  Pulse: (!) 103  Resp: 16   Filed Weights   06/12/17 1113  Weight: 149 lb 11.2 oz (67.9 kg)      Physical Exam  Constitutional: She is oriented to person, place, and time and well-developed, well-nourished, and in no distress.  HENT:  Head: Normocephalic.  Mouth/Throat: Oropharynx is clear and moist. No oropharyngeal exudate.  Eyes: Pupils are equal, round, and reactive to light. Conjunctivae are normal. No scleral icterus.  Neck: Normal range of motion. Neck supple.  Cardiovascular: Regular rhythm.   Mild tachycardia  Pulmonary/Chest: Effort normal and breath sounds  normal. No respiratory distress. She has no wheezes. She has no rales.    Abdominal: Soft. Bowel sounds are normal. There is no tenderness. There is no rebound and no guarding.  Musculoskeletal: Normal range of motion. She exhibits no edema.  Lymphadenopathy:    She has no cervical adenopathy.  Neurological: She is alert and oriented to person, place, and time. No cranial nerve deficit. Gait normal.  Skin: Skin is warm and dry. No rash noted.  Nail discoloration (as expected from previous chemotherapy).   Psychiatric: Mood, memory, affect and judgment normal.  Nursing note and vitals reviewed.    LABORATORY DATA:  I have reviewed the labs as listed.  CBC    Component Value Date/Time   WBC 6.9 06/12/2017 1102   RBC 2.74 (L) 06/12/2017 1102   HGB 7.9 (L) 06/12/2017 1102   HCT 24.2 (L) 06/12/2017 1102   PLT 316 06/12/2017 1102   MCV 88.3 06/12/2017 1102   MCH 28.8 06/12/2017 1102   MCHC 32.6 06/12/2017 1102   RDW 16.4 (H) 06/12/2017 1102   LYMPHSABS 0.9 06/12/2017 1102   MONOABS 0.7 06/12/2017 1102   EOSABS 0.1 06/12/2017 1102   BASOSABS 0.1 06/12/2017 1102   CMP Latest Ref Rng & Units 06/12/2017 05/01/2017 03/08/2017  Glucose 65 - 99 mg/dL 97 113(H) 101(H)  BUN 6 - 20 mg/dL 17 25(H) 9  Creatinine 0.44 - 1.00 mg/dL 1.33(H) 1.65(H) 0.54  Sodium 135 - 145 mmol/L 138 133(L) 137  Potassium 3.5 - 5.1 mmol/L 4.3 3.6 3.4(L)  Chloride 101 - 111 mmol/L 108 101 102  CO2 22 - 32 mmol/L '23 23 28  '$ Calcium 8.9 - 10.3 mg/dL 9.6 9.6 9.2  Total Protein 6.5 - 8.1 g/dL 8.1 7.9 6.5  Total Bilirubin 0.3 - 1.2 mg/dL 0.3 0.6 0.5  Alkaline Phos 38 - 126 U/L 83 78 77  AST 15 - 41 U/L '18 16 17  '$ ALT 14 - 54 U/L '15 15 19    '$ PENDING LABS:    DIAGNOSTIC IMAGING:    PATHOLOGY:  Right breast lumpectomy surgical path: 09/26/16         ASSESSMENT & PLAN:   Stage IA (T1cN0M0) invasive ductal carcinoma of right breast; ER-/PR-/HER2-: -Diagnosed in 08/2016.  Treated with lumpectomy and  adjuvant chemotherapy with Adriamycin/Cytoxan x 4 cycles. Completed 9 of 12 planned cycles of weekly Taxol; chemo course was complicated by grade 3 peripheral neuropathy and Taxol was subsequently discontinued after cycle #9.  Went on to complete adjuvant breast radiation in [place] on 04/26/17.  -Discussed with her the surveillance recommendations for breast cancer including clinical breast exams every few months and mammography annually.  Her highest risk of recurrence will be in the first 2 years after treatment. She understands that anti-estrogen therapy is not indicated for her since her tumor was ER negative.   -Mammogram will be due sometime within the next 4-5 months. Orders placed for diagnostic mammogram to be done in 09/2017; this will be about 5 months since her completion of her breast radiation therapy, which should be sufficient healing time.   -Return to cancer center in 09/2017 for follow-up with labs and after mammogram.  Fatigue:  -Labs resulted after patient left clinic today.  Patient is anemic with Hgb 7.9 g/dL. This is likely the etiology of her fatigue.  No reported blood in her stools, dark/tarry stools, vaginal bleeding, or hematuria.  -Will ask nursing to contact patient and help make arrangements for PRBC transfusion. 2 units PRBCs ordered.  Will try to have her come in for type & screen tomorrow (06/13/17) and blood transfusion on Friday (06/14/17).   Peripheral neuropathy:  -Improved. No longer taking gabapentin. She continues to have neuropathy to her right index finger, but otherwise her neuropathy to her hands and feet has resolved.  She  does continue to have some "face jumping" at times, which is likely neuritis vs muscle spasms; could be secondary to chemotherapy as well.   -Provided reassurance that sharp/shooting pains to breast is likely secondary to surgery and radiation therapy. Reviewed with her that nerves can take up to 1 year to fully heal and she may continue to have  intermittent symptoms as she recovers/heals. Will keep monitoring.   Port-a-cath maintenance:  -Discussed with her that our general policy is to leave the port-a-cath in place for at least 1 year from the time of completion of chemotherapy, which for her would be ~01/2018.  She voices understanding and agrees with plan.  -Continue port flush every 2 months.         Dispo:  -2 units PRBCs this week.  -Continue port-a-cath flush every 2 months.  -Mammogram due in 09/2017; orders placed today.  -Return to cancer center in 4 months after mammogram for follow-up with labs/port flush.      All questions were answered to patient's stated satisfaction. Encouraged patient to call with any new concerns or questions before her next visit to the cancer center and we can certain see her sooner, if needed.    Plan of care discussed with Dr. Talbert Cage, who agrees with the above aforementioned.    Orders placed this encounter:  Orders Placed This Encounter  Procedures  . MM DIAG BREAST TOMO BILATERAL      Mike Craze, NP [place] [phone]

## 2017-06-13 ENCOUNTER — Other Ambulatory Visit (HOSPITAL_COMMUNITY): Payer: Self-pay

## 2017-06-13 ENCOUNTER — Encounter (HOSPITAL_COMMUNITY): Payer: BLUE CROSS/BLUE SHIELD

## 2017-06-13 DIAGNOSIS — C50911 Malignant neoplasm of unspecified site of right female breast: Secondary | ICD-10-CM

## 2017-06-13 DIAGNOSIS — D649 Anemia, unspecified: Secondary | ICD-10-CM

## 2017-06-13 LAB — PREPARE RBC (CROSSMATCH)

## 2017-06-14 ENCOUNTER — Encounter (HOSPITAL_COMMUNITY): Payer: BLUE CROSS/BLUE SHIELD

## 2017-06-14 ENCOUNTER — Encounter (HOSPITAL_COMMUNITY): Payer: Self-pay

## 2017-06-14 DIAGNOSIS — D649 Anemia, unspecified: Secondary | ICD-10-CM

## 2017-06-14 DIAGNOSIS — C50911 Malignant neoplasm of unspecified site of right female breast: Secondary | ICD-10-CM

## 2017-06-14 MED ORDER — SODIUM CHLORIDE 0.9% FLUSH
10.0000 mL | INTRAVENOUS | Status: AC | PRN
  Administered 2017-06-14: 10 mL

## 2017-06-14 MED ORDER — HEPARIN SOD (PORK) LOCK FLUSH 100 UNIT/ML IV SOLN
INTRAVENOUS | Status: AC
  Filled 2017-06-14: qty 5

## 2017-06-14 MED ORDER — HEPARIN SOD (PORK) LOCK FLUSH 100 UNIT/ML IV SOLN
500.0000 [IU] | Freq: Every day | INTRAVENOUS | Status: AC | PRN
  Administered 2017-06-14: 500 [IU]

## 2017-06-14 MED ORDER — DIPHENHYDRAMINE HCL 25 MG PO CAPS
25.0000 mg | ORAL_CAPSULE | Freq: Once | ORAL | Status: AC
  Administered 2017-06-14: 25 mg via ORAL

## 2017-06-14 MED ORDER — DIPHENHYDRAMINE HCL 25 MG PO CAPS
ORAL_CAPSULE | ORAL | Status: AC
  Filled 2017-06-14: qty 1

## 2017-06-14 MED ORDER — ACETAMINOPHEN 325 MG PO TABS
ORAL_TABLET | ORAL | Status: AC
  Filled 2017-06-14: qty 2

## 2017-06-14 MED ORDER — ACETAMINOPHEN 325 MG PO TABS
650.0000 mg | ORAL_TABLET | Freq: Once | ORAL | Status: AC
  Administered 2017-06-14: 650 mg via ORAL

## 2017-06-14 MED ORDER — SODIUM CHLORIDE 0.9 % IV SOLN
250.0000 mL | Freq: Once | INTRAVENOUS | Status: AC
  Administered 2017-06-14: 250 mL via INTRAVENOUS

## 2017-06-14 NOTE — Progress Notes (Signed)
2 units of blood given today per orders. Patient tolerated it without any problems. Vitals stable and discharged home from clinic ambulatory. Follow up as scheduled.

## 2017-06-15 LAB — BPAM RBC
Blood Product Expiration Date: 201808102359
Blood Product Expiration Date: 201808112359
ISSUE DATE / TIME: 201807200859
ISSUE DATE / TIME: 201807201104
Unit Type and Rh: 1700
Unit Type and Rh: 1700

## 2017-06-15 LAB — TYPE AND SCREEN
ABO/RH(D): B POS
Antibody Screen: NEGATIVE
Unit division: 0
Unit division: 0

## 2017-06-27 ENCOUNTER — Encounter: Payer: Self-pay | Admitting: Genetic Counselor

## 2017-08-08 ENCOUNTER — Encounter (HOSPITAL_COMMUNITY): Payer: BLUE CROSS/BLUE SHIELD | Attending: Hematology & Oncology

## 2017-08-08 ENCOUNTER — Encounter (HOSPITAL_COMMUNITY): Payer: Self-pay

## 2017-08-08 ENCOUNTER — Encounter (HOSPITAL_COMMUNITY): Payer: Self-pay | Admitting: Adult Health

## 2017-08-08 VITALS — BP 135/86 | HR 76 | Temp 98.5°F | Resp 16

## 2017-08-08 DIAGNOSIS — C50411 Malignant neoplasm of upper-outer quadrant of right female breast: Secondary | ICD-10-CM

## 2017-08-08 DIAGNOSIS — E876 Hypokalemia: Secondary | ICD-10-CM | POA: Diagnosis present

## 2017-08-08 DIAGNOSIS — D72825 Bandemia: Secondary | ICD-10-CM | POA: Insufficient documentation

## 2017-08-08 DIAGNOSIS — C50919 Malignant neoplasm of unspecified site of unspecified female breast: Secondary | ICD-10-CM

## 2017-08-08 DIAGNOSIS — C50911 Malignant neoplasm of unspecified site of right female breast: Secondary | ICD-10-CM | POA: Insufficient documentation

## 2017-08-08 LAB — CBC WITH DIFFERENTIAL/PLATELET
Basophils Absolute: 0.1 10*3/uL (ref 0.0–0.1)
Basophils Relative: 1 %
Eosinophils Absolute: 0.2 10*3/uL (ref 0.0–0.7)
Eosinophils Relative: 2 %
HCT: 33.9 % — ABNORMAL LOW (ref 36.0–46.0)
Hemoglobin: 11.4 g/dL — ABNORMAL LOW (ref 12.0–15.0)
Lymphocytes Relative: 16 %
Lymphs Abs: 1.2 10*3/uL (ref 0.7–4.0)
MCH: 30.8 pg (ref 26.0–34.0)
MCHC: 33.6 g/dL (ref 30.0–36.0)
MCV: 91.6 fL (ref 78.0–100.0)
Monocytes Absolute: 0.6 10*3/uL (ref 0.1–1.0)
Monocytes Relative: 8 %
Neutro Abs: 5.3 10*3/uL (ref 1.7–7.7)
Neutrophils Relative %: 73 %
Platelets: 256 10*3/uL (ref 150–400)
RBC: 3.7 MIL/uL — ABNORMAL LOW (ref 3.87–5.11)
RDW: 14.4 % (ref 11.5–15.5)
WBC: 7.2 10*3/uL (ref 4.0–10.5)

## 2017-08-08 LAB — COMPREHENSIVE METABOLIC PANEL
ALT: 19 U/L (ref 14–54)
AST: 23 U/L (ref 15–41)
Albumin: 3.9 g/dL (ref 3.5–5.0)
Alkaline Phosphatase: 87 U/L (ref 38–126)
Anion gap: 7 (ref 5–15)
BUN: 10 mg/dL (ref 6–20)
CO2: 27 mmol/L (ref 22–32)
Calcium: 9.6 mg/dL (ref 8.9–10.3)
Chloride: 105 mmol/L (ref 101–111)
Creatinine, Ser: 0.78 mg/dL (ref 0.44–1.00)
GFR calc Af Amer: >60 mL/min (ref >60)
GFR calc non Af Amer: >60 mL/min (ref >60)
Glucose, Bld: 91 mg/dL (ref 65–99)
Potassium: 3.6 mmol/L (ref 3.5–5.1)
Sodium: 139 mmol/L (ref 135–145)
Total Bilirubin: 0.5 mg/dL (ref 0.3–1.2)
Total Protein: 7.6 g/dL (ref 6.5–8.1)

## 2017-08-08 MED ORDER — SODIUM CHLORIDE 0.9% FLUSH
10.0000 mL | INTRAVENOUS | Status: DC | PRN
  Administered 2017-08-08: 10 mL via INTRAVENOUS
  Filled 2017-08-08: qty 10

## 2017-08-08 MED ORDER — HEPARIN SOD (PORK) LOCK FLUSH 100 UNIT/ML IV SOLN
500.0000 [IU] | Freq: Once | INTRAVENOUS | Status: AC
  Administered 2017-08-08: 500 [IU] via INTRAVENOUS
  Filled 2017-08-08: qty 5

## 2017-08-08 NOTE — Progress Notes (Signed)
Kimberly Roman presented for Portacath access and flush.  Proper placement of portacath confirmed by CXR.  Portacath located left chest wall accessed with  H 20 needle.  Good blood return present. Portacath flushed with 44ml NS and 500U/57ml Heparin and needle removed intact.  Procedure tolerated well and without incident. Blood sent to lab for resulting. Pt stable and discharged home ambulatory.

## 2017-08-08 NOTE — Progress Notes (Signed)
Patient was initially seen and examined today after reported concerns of right breast pain and questionable lump.  Right breast palpated in area of patient concern. There is some associated soft tissue swelling and fibrosis, as expected after lumpectomy and radiation. She completed radiation on 04/26/17.  There may be possible seroma posterior to the lumpectomy scar.  Her breast pain is intermittent, sharp/shooting pains, consistent with post lumpectomy and radiation healing. At this time, there are no palpable masses or nodularities that are concerning for recurrent disease.  No skin ulcerations or wounds. Mild hyperpigmentation appreciated, as expected after radiation ~3 months ago.  Encouraged her to continue to monitor her symptoms, and let us know if symptoms worsen.  She is scheduled for mammogram in 09/2017, as well as follow-up here at the cancer center at that time. She knows to call us with any additional questions or concerns. She agreed with the above plan of care.   Mike Craze, NP [place] [phone]

## 2017-08-25 IMAGING — US US BREAST*R* LIMITED INC AXILLA
1 series · 8 of 8 positions shown · non-contrast
Comparison: Previous exam(s).

CLINICAL DATA: Mass right breast identified on recent 2D screening
mammogram.

EXAM:
2D DIGITAL DIAGNOSTIC RIGHT MAMMOGRAM WITH CAD AND ADJUNCT TOMO
ULTRASOUND RIGHT BREAST

[Series 1: us breast*right* limited inc axilla · 0.07mm/px · 8 of 8 slices shown]
[im 1/8]
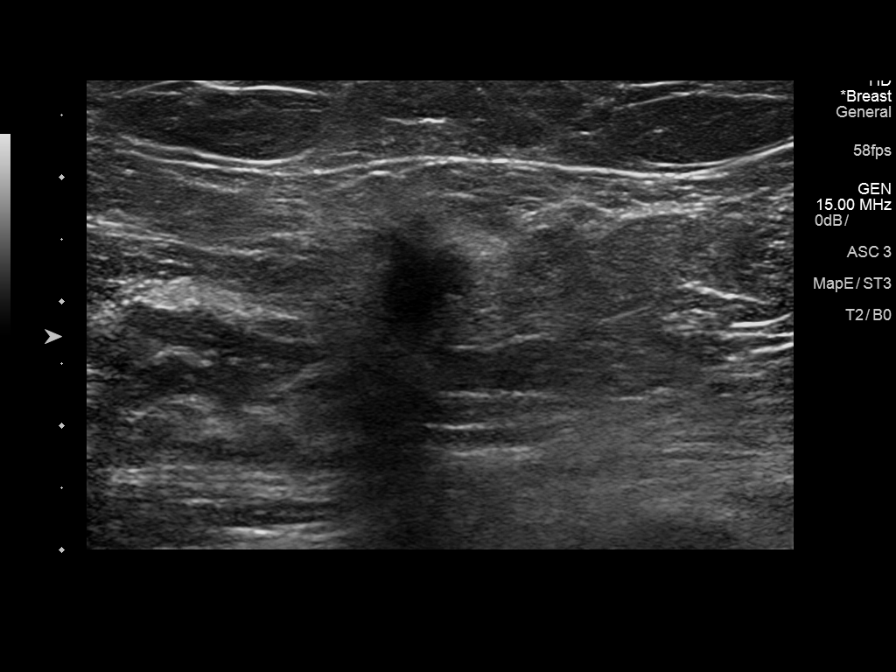
[im 2/8]
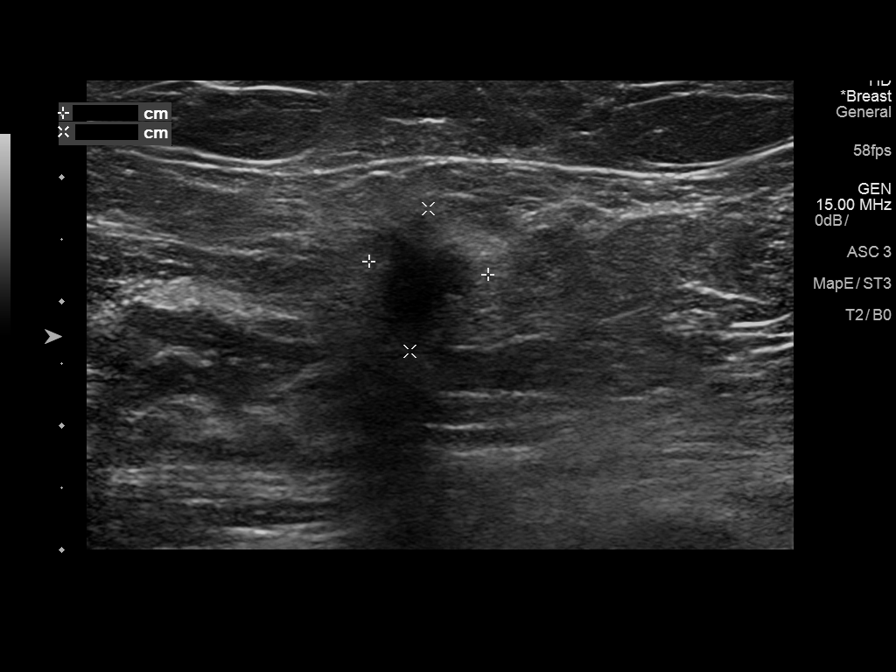
[im 3/8]
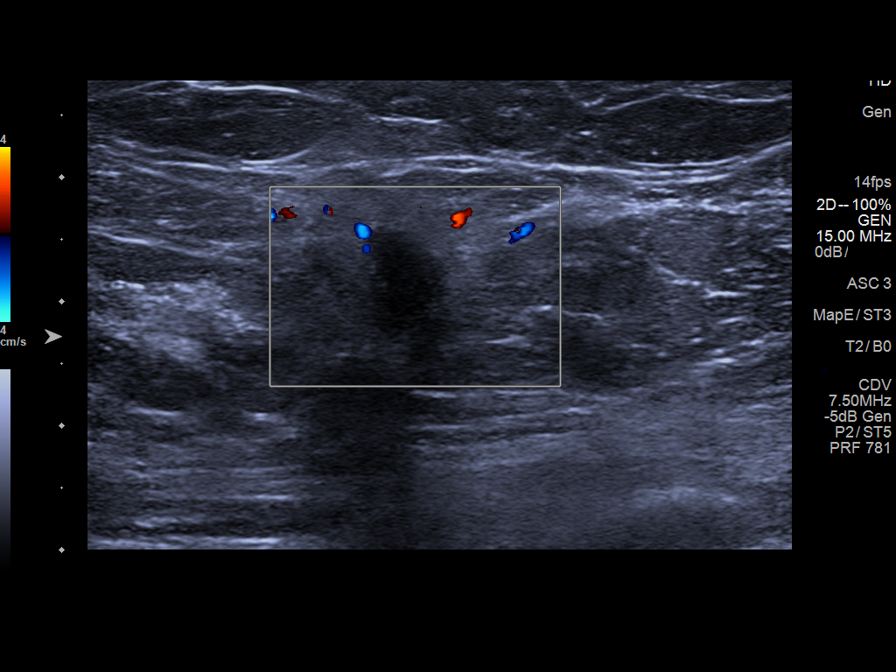
[im 4/8]
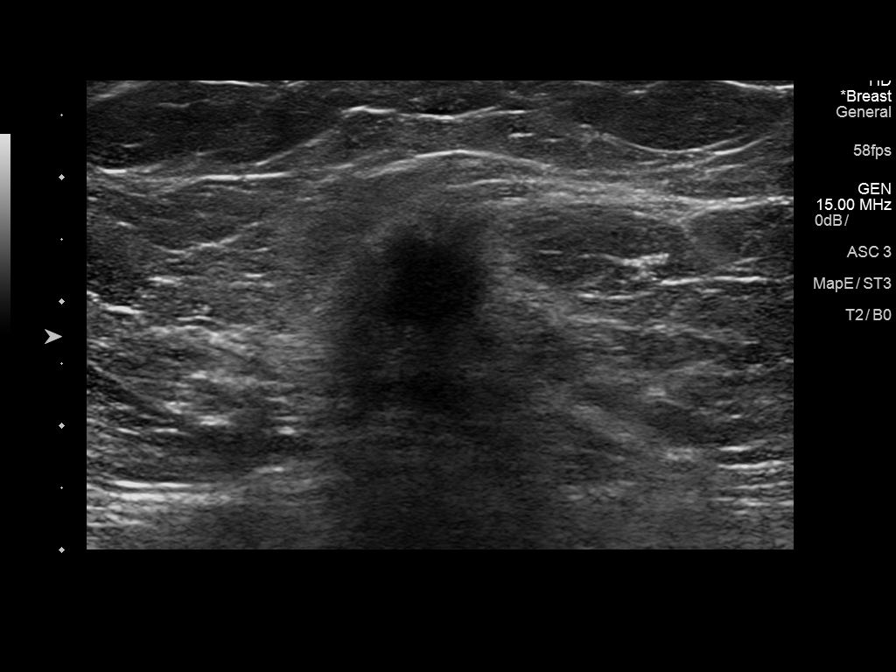
[im 5/8]
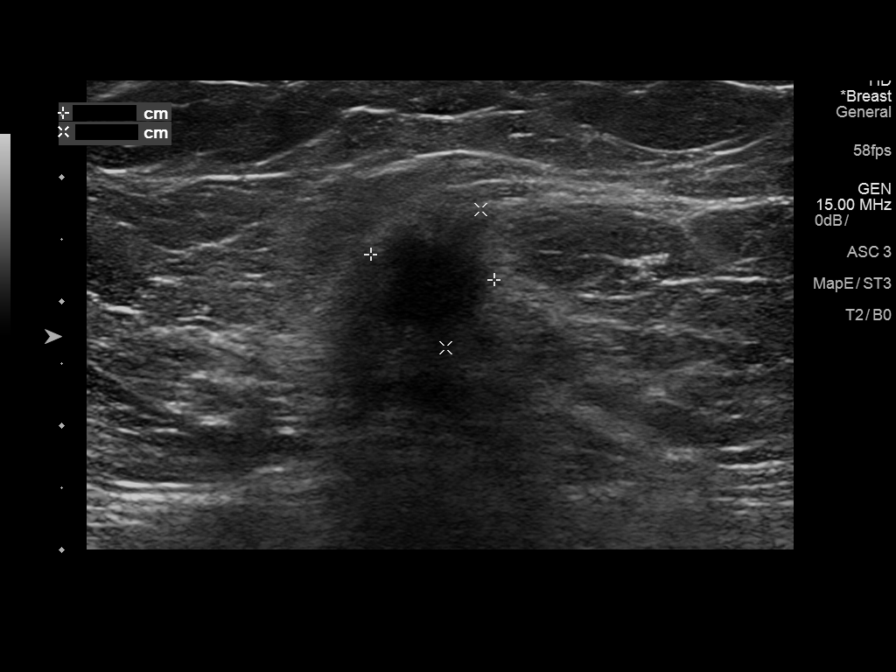
[im 6/8]
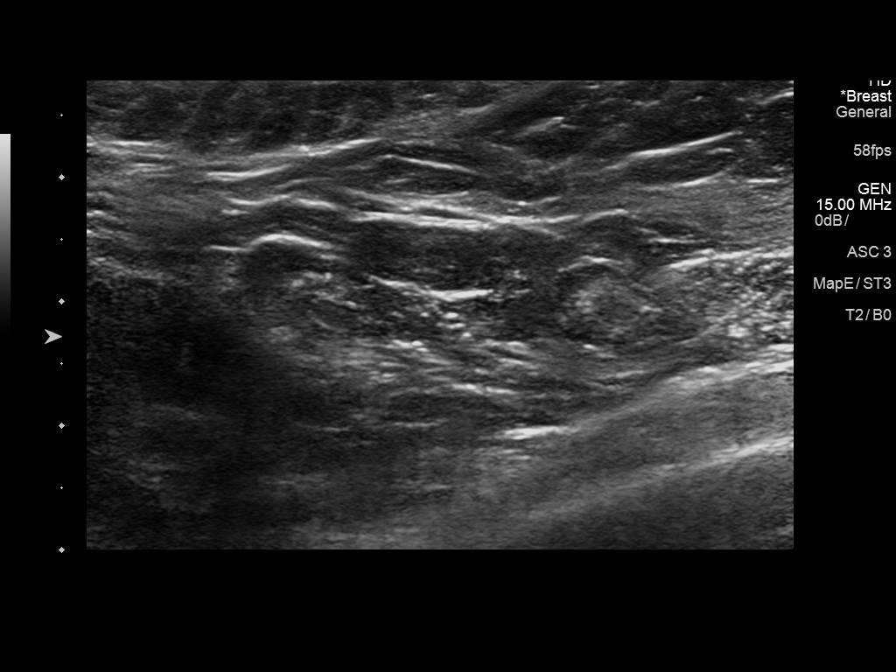
[im 7/8]
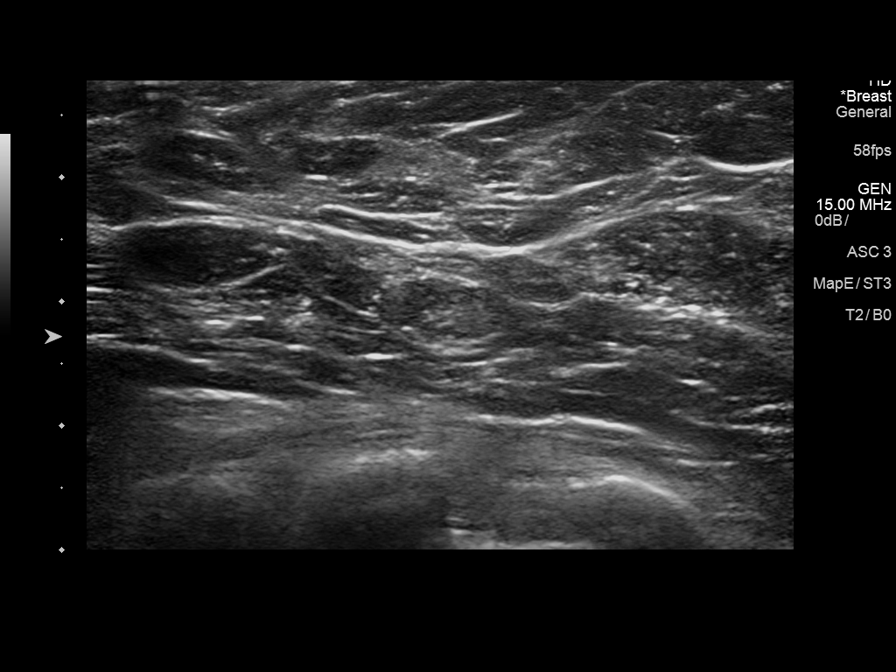
[im 8/8]
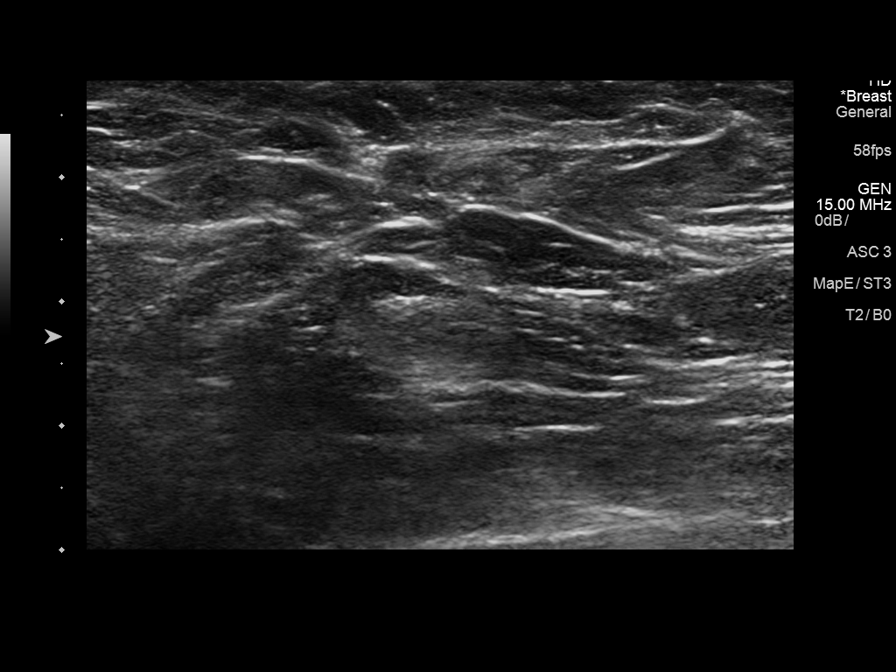

[8 of 8 positions shown; findings below may reference images not displayed]

ACR Breast Density Category b: There are scattered areas of
fibroglandular density.
FINDINGS: The whole breast CC and MLO views including tomography are
performed. There is a spiculated mass in the posterior third of the
upper outer quadrant of the right breast.

Mammographic images were processed with CAD.

On physical exam, no definite mass is palpated in the upper-outer
quadrant of the right breast.

Targeted ultrasound is performed, showing a hypoechoic irregular
mass with indistinct margins at 10 o'clock position 9 cm from the
nipple measuring approximately 1.1 x 1.2 x 1.0 cm. No additional
masses are seen in this region of the right breast on ultrasound.

Ultrasound of the right axilla is negative for lymphadenopathy.
IMPRESSION: Suspicious 1.2 cm mass 10 o'clock position right breast.

RECOMMENDATION:
Ultrasound-guided core needle biopsy is recommended and has been
discussed with the patient. Biopsy has been scheduled for August 28, 2016 at 1 o'clock p.m.

I have discussed the findings and recommendations with the patient.
Results were also provided in writing at the conclusion of the
visit. If applicable, a reminder letter will be sent to the patient
regarding the next appointment.

BI-RADS CATEGORY  5: Highly suggestive of malignancy.

## 2017-09-18 ENCOUNTER — Other Ambulatory Visit: Payer: Self-pay | Admitting: Adult Health

## 2017-09-18 DIAGNOSIS — N6310 Unspecified lump in the right breast, unspecified quadrant: Secondary | ICD-10-CM

## 2017-09-18 DIAGNOSIS — N6320 Unspecified lump in the left breast, unspecified quadrant: Secondary | ICD-10-CM

## 2017-10-01 ENCOUNTER — Ambulatory Visit (HOSPITAL_COMMUNITY)
Admission: RE | Admit: 2017-10-01 | Discharge: 2017-10-01 | Disposition: A | Discharge status: Home / Self Care | Payer: BLUE CROSS/BLUE SHIELD | Source: Ambulatory Visit | Attending: Adult Health | Admitting: Adult Health

## 2017-10-01 ENCOUNTER — Encounter (HOSPITAL_COMMUNITY): Payer: Self-pay

## 2017-10-01 DIAGNOSIS — Z9889 Other specified postprocedural states: Secondary | ICD-10-CM | POA: Diagnosis not present

## 2017-10-01 DIAGNOSIS — Z853 Personal history of malignant neoplasm of breast: Secondary | ICD-10-CM | POA: Insufficient documentation

## 2017-10-01 DIAGNOSIS — C50911 Malignant neoplasm of unspecified site of right female breast: Secondary | ICD-10-CM | POA: Diagnosis present

## 2017-10-02 NOTE — Progress Notes (Signed)
Kimberly Roman, Kimberly Roman   CLINIC:  Medical Oncology/Hematology  PCP:  Rosita Fire, MD [name] [address] [phone]   REASON FOR VISIT:  Follow-up for Stage IA (T1cN0M0) invasive ductal carcinoma of right breast; ER-/PR-/HER2-  CURRENT THERAPY: Surveillance per NCCN Guidelines    BRIEF ONCOLOGIC HISTORY:    Invasive ductal carcinoma of breast ([place])   08/13/2016 Mammogram    BI-RADS CATEGORY  0: Incomplete.      08/21/2016 Mammogram    BI-RADS CATEGORY  5: Highly suggestive of malignancy. Suspicious 1.2 cm mass 10 o'clock position right breast.      08/21/2016 Breast US    Targeted ultrasound is performed, showing a hypoechoic irregular mass with indistinct margins at 10 o'clock position 9 cm from the nipple measuring approximately 1.1 x 1.2 x 1.0 cm. No additional masses are seen in this region of the right breast on ultrasound.      08/28/2016 Procedure    Right needle core biopsy      09/26/2016 Procedure    Right upper outer quadrant lumpectomy by Dr. Arnoldo Morale      09/28/2016 Pathology Results    Invasive ductal carcinoma, grade 3, standing 1.7 cm. High-grade ductal carcinoma in situ with comedonecrosis. Invasive carcinoma in less than 0.1 cm of the superior medial margin focally. In situ carcinoma is less than 0.1 cm of superior medial margin and the posterior margin focally. No LVI. ER negative. PR negative. HER-2 negative. KI-67 90%.      09/28/2016 Cancer Staging    Invasive ductal carcinoma of breast [facility])   Staging form: Breast, AJCC 7th Edition   - Clinical stage from 09/28/2016: Stage IA (T1c, N0, M0) - Signed by Baird Cancer, PA-C on 10/22/2016      09/29/2016 Pathology Results    Invasive ductal carcinoma, grade 2/3. HER-2 negative. ER negative. PR negative. Ki-67 90%.      10/24/2016 Echocardiogram    - Left ventricle: The cavity size was normal. Wall thickness was   normal.  Systolic function was normal. The estimated ejection   fraction was in the range of 60% to 65%. Wall motion was normal;   there were no regional wall motion abnormalities.       10/26/2016 Procedure    Port placed placed by Dr. Arnoldo Morale      11/02/2016 -  Chemotherapy    The patient had DOXOrubicin (ADRIAMYCIN) chemo injection 106 mg, 60 mg/m2 = 106 mg, Intravenous,  Once, 4 of 4 cycles  palonosetron (ALOXI) injection 0.25 mg, 0.25 mg, Intravenous,  Once, 4 of 4 cycles  pegfilgrastim (NEULASTA ONPRO KIT) injection 6 mg, 6 mg, Subcutaneous, Once, 4 of 4 cycles  cyclophosphamide (CYTOXAN) 1,060 mg in sodium chloride 0.9 % 250 mL chemo infusion, 600 mg/m2 = 1,060 mg, Intravenous,  Once, 4 of 4 cycles  fosaprepitant (EMEND) 150 mg, dexamethasone (DECADRON) 12 mg in sodium chloride 0.9 % 145 mL IVPB, , Intravenous,  Once, 4 of 4 cycles  PACLitaxel (TAXOL) 138 mg in dextrose 5 % 250 mL chemo infusion ( ondansetron (ZOFRAN) 8 mg in sodium chloride 0.9 % 50 mL IVPB, , Intravenous,  Once, 4 of 12 cycles  for chemotherapy treatment.        03/28/2017 - 04/26/2017 Radiation Therapy    Adjuvant breast radiation [facility]). Right breast 3D CRT 6x photons 16 fractions at 2.65 GY/f to 42.4 Gy TB boost 3 field 6X photons 4 fractions  at 2.5 Gy/f to 10 GY             INTERVAL HISTORY:  [name] presents for routine follow-up for history of right breast cancer.   Here today unaccompanied.  Overall, she tells me she has been feeling "much better."  Appetite 100%; energy level 75%.  She denies any pain at the moment. She tells me "I don't always want to eat, but I go ahead and do it because I need to. Then sometimes I will get hungry and have a good appetite!"  Her energy levels are much better "since you gave me that blood a while back during my radiation."  She is able to do all of her ADLs now without having to take frequent rest breaks.   Recently had mammogram on 10/01/17, which was negative.  She  denies any changes in her breasts including lumps, skin changes, or nipple discharge.  She does report intermittent right breast sharp pain, particularly at the lumpectomy site.  She reports that her recent mammogram was very painful in this area, and is slowly improving over time.  She is excited to share with me a recent trip she and her mother (who is 16 years old) took to the [place].  This was the first time that both she and her mother had ridden on a boat; they had a wonderful trip.      REVIEW OF SYSTEMS:  Review of Systems  Constitutional: Positive for fatigue. Negative for chills and fever.  HENT:  Negative.  Negative for lump/mass and nosebleeds.   Eyes: Negative.   Respiratory: Negative.  Negative for cough and shortness of breath.   Cardiovascular: Negative.  Negative for chest pain and leg swelling.  Gastrointestinal: Negative.  Negative for abdominal pain, blood in stool, constipation, diarrhea, nausea and vomiting.  Endocrine: Negative.   Genitourinary: Negative.  Negative for dysuria and hematuria.   Musculoskeletal: Negative.  Negative for arthralgias.  Skin: Negative.  Negative for rash.  Neurological: Negative.  Negative for dizziness and headaches.  Hematological: Negative.  Negative for adenopathy. Does not bruise/bleed easily.  Psychiatric/Behavioral: Negative.  Negative for depression and sleep disturbance. The patient is not nervous/anxious.      PAST MEDICAL/SURGICAL HISTORY:  Past Medical History:  Diagnosis Date  . Ankylosing spondylitis ([place])   . Arthritis   . Breast cancer ([place])   . Carpal tunnel syndrome   . Environmental allergies   . GERD (gastroesophageal reflux disease)   . Hyperlipemia   . Hypertension   . Invasive ductal carcinoma of breast ([place]) 10/22/2016   Past Surgical History:  Procedure Laterality Date  . ABDOMINAL HYSTERECTOMY       SOCIAL HISTORY:  Social History   Socioeconomic History  . Marital status: Single     Spouse name: Not on file  . Number of children: 0  . Years of education: 20  . Highest education level: Some college, no degree  Social Needs  . Financial resource strain: Not hard at all  . Food insecurity - worry: Never true  . Food insecurity - inability: Never true  . Transportation needs - medical: No  . Transportation needs - non-medical: No  Occupational History  . Not on file  Tobacco Use  . Smoking status: Never Smoker  . Smokeless tobacco: Never Used  Substance and Sexual Activity  . Alcohol use: No  . Drug use: No  . Sexual activity: Not on file  Other Topics Concern  . Not on  file  Social History Narrative  . Not on file    FAMILY HISTORY:  Family History  Problem Relation Age of Onset  . Arthritis Unknown   . Hypertension Mother   . Hypertension Father   . Colon cancer Neg Hx     CURRENT MEDICATIONS:  Outpatient Encounter Medications as of 10/03/2017  Medication Sig  . acetaminophen (TYLENOL) 650 MG CR tablet Take 650 mg by mouth every 8 (eight) hours as needed for pain.  Marland Kitchen amLODipine (NORVASC) 5 MG tablet Take 5 mg by mouth daily.  . Artificial Tear Solution (SOOTHE XP) SOLN Apply 2 drops to eye 3 (three) times daily as needed (for dry/irritated eyes).   Marland Kitchen omeprazole (PRILOSEC) 20 MG capsule Take 20 mg by mouth every morning.   . sodium chloride (OCEAN) 0.65 % SOLN nasal spray Place 1 spray into both nostrils 4 (four) times daily as needed (for dry/congestion/allergies.).   No facility-administered encounter medications on file as of 10/03/2017.     ALLERGIES:  Allergies  Allergen Reactions  . Atorvastatin     Other reaction(s): Other (See Comments) REACTION: Cramping in legs/lower extremties  . Lipitor [Atorvastatin Calcium] Other (See Comments)    REACTION: Cramping in legs/lower extremties     PHYSICAL EXAM:  ECOG Performance status: 0-1 - Mildly symptomatic; remains completely independent   Vitals:   10/03/17 1052  BP: (!) 151/63  Pulse:  95  Resp: 18  Temp: 98.1 F (36.7 C)  SpO2: 100%   Filed Weights   10/03/17 1052  Weight: 166 lb 8 oz (75.5 kg)      Physical Exam  Constitutional: She is oriented to person, place, and time and well-developed, well-nourished, and in no distress.  HENT:  Head: Normocephalic.  Mouth/Throat: Oropharynx is clear and moist. No oropharyngeal exudate.  Eyes: Conjunctivae are normal. Pupils are equal, round, and reactive to light. No scleral icterus.  Neck: Normal range of motion. Neck supple.  Cardiovascular: Normal rate and regular rhythm.  Pulmonary/Chest: Effort normal and breath sounds normal. No respiratory distress. She has no wheezes.    Abdominal: Soft. Bowel sounds are normal. There is no tenderness.  Musculoskeletal: Normal range of motion. She exhibits no edema.  Lymphadenopathy:    She has no cervical adenopathy.       Right: No supraclavicular adenopathy present.       Left: No supraclavicular adenopathy present.  Neurological: She is alert and oriented to person, place, and time. No cranial nerve deficit. Gait normal.  Skin: Skin is warm and dry. No rash noted.  Psychiatric: Mood, memory, affect and judgment normal.  Nursing note and vitals reviewed.    LABORATORY DATA:  I have reviewed the labs as listed.  CBC    Component Value Date/Time   WBC 6.7 10/03/2017 1145   RBC 3.88 10/03/2017 1145   HGB 11.8 (L) 10/03/2017 1145   HCT 36.6 10/03/2017 1145   PLT 264 10/03/2017 1145   MCV 94.3 10/03/2017 1145   MCH 30.4 10/03/2017 1145   MCHC 32.2 10/03/2017 1145   RDW 13.0 10/03/2017 1145   LYMPHSABS 1.2 10/03/2017 1145   MONOABS 0.7 10/03/2017 1145   EOSABS 0.1 10/03/2017 1145   BASOSABS 0.0 10/03/2017 1145   CMP Latest Ref Rng & Units 10/03/2017 08/08/2017 06/12/2017  Glucose 65 - 99 mg/dL 81 91 97  BUN 6 - 20 mg/dL '13 10 17  '$ Creatinine 0.44 - 1.00 mg/dL 0.78 0.78 1.33(H)  Sodium 135 - 145 mmol/L 140 139 138  Potassium 3.5 - 5.1 mmol/L 3.5 3.6 4.3    Chloride 101 - 111 mmol/L 103 105 108  CO2 22 - 32 mmol/L '27 27 23  '$ Calcium 8.9 - 10.3 mg/dL 9.5 9.6 9.6  Total Protein 6.5 - 8.1 g/dL 7.5 7.6 8.1  Total Bilirubin 0.3 - 1.2 mg/dL 0.4 0.5 0.3  Alkaline Phos 38 - 126 U/L 113 87 83  AST 15 - 41 U/L '22 23 18  '$ ALT 14 - 54 U/L '22 19 15    '$ PENDING LABS:    DIAGNOSTIC IMAGING:  *The following diagnostic imaging has been reviewed and I agree with the radiology reports as listed below.  Mammogram: 10/01/17 CLINICAL DATA:  61 year old female with history of triple negative right breast cancer post lumpectomy 09/26/2016 followup by chemotherapy and radiation.  EXAM: 2D DIGITAL DIAGNOSTIC BILATERAL MAMMOGRAM WITH CAD AND ADJUNCT TOMO  COMPARISON:  Previous exam(s).  ACR Breast Density Category c: The breast tissue is heterogeneously dense, which may obscure small masses.  FINDINGS: No suspicious masses or calcifications are seen in either breast. New postsurgical/post treatment changes are present with diffuse skin and trabecular thickening as well as surgical scar in the upper-outer posterior right breast related to interval lumpectomy. Spot compression magnification tangential view of the lumpectomy site in the right breast was performed. There is no mammographic evidence of locally recurrent malignancy.  Mammographic images were processed with CAD.  IMPRESSION: New postsurgical site right breast. There are no findings of malignancy in either breast.  RECOMMENDATION: Diagnostic mammogram is suggested in 1 year. (Code:[id number]-B-01Y)  I have discussed the findings and recommendations with the patient. Results were also provided in writing at the conclusion of the visit. If applicable, a reminder letter will be sent to the patient regarding the next appointment.  BI-RADS CATEGORY  2: Benign.   Electronically Signed   By: Everlean Alstrom M.D.   On: 10/01/2017 10:52    PATHOLOGY:  Right breast lumpectomy surgical  path: 09/26/16           ASSESSMENT & PLAN:   Stage IA (T1cN0M0) invasive ductal carcinoma of right breast; ER-/PR-/HER2-: -Diagnosed in 08/2016.  Treated with lumpectomy and adjuvant chemotherapy with Adriamycin/Cytoxan x 4 cycles. Completed 9 of 12 planned cycles of weekly Taxol; chemo course was complicated by grade 3 peripheral neuropathy and Taxol was subsequently discontinued after cycle #9.  Completed chemo on 02/21/17.  Went on to complete adjuvant breast radiation in [place] on 04/26/17. She required PRBC transfusion during her radiation therapy for significant anemia.  -No role for anti-estrogen therapy given triple negative disease.  -Most recent mammogram completed on 10/01/17 and negative for malignancy. I reviewed these results with her in detail and she was provided a copy of the radiologic report as well.  Next mammogram will be due in 09/2018; will place those orders as subsequent follow-up visits.  -Clinical breast exam performed today and negative for recurrence.   -Reviewed the surveillance/follow-up schedule with her. She understands that her highest risk of recurrence is in the first 2 years, however triple negative can sometimes have late recurrence.  Therefore, we will maintain close follow-up with H&P every few months for the next year or so. She agrees with this plan.   -Return to cancer center in 4 months for follow-up with labs.    Port-a-cath maintenance:  -Shared with her that we generally keep port-a-cath in place for at least 1 year post-chemotherapy; she completed her therapy in 01/2017.  -Continue port flush every 2  months.  She will have port flush with labs collected today.  We will notify her of the lab results when they are available.  -If she continues to feel well at next follow-up visit in 4 months, then we can consider having her port-a-cath removed at that time.     (R) breast lymphedema/pain:  -Apparent right breast lymphedema with mild  hyperpigmentation on physical exam, likely secondary to previous lumpectomy and XRT.  She also has intermittent sharp/shooting breast pain, which is likely post-lumpectomy mastalgia/neuralgia. Pain is not severe and becoming less frequent, which is encouraging. Recommended she let us know if breast pain increases.  -Offered her referral to outpatient PT/rehab for breast lymphedema, where they would perform manual massage for edema.  She prefers to wait to see if the swelling will improve on its own before referral, which is reasonable. Encouraged her to call us and let us know if she changes her mind in the interim and I can place referral. She agreed with this plan.    Genetic testing:  -We discussed referral to genetic counseling/testing since she was 61 years old at the time of diagnosis and had triple negative disease.  She declines referral stating, "I have no breast cancer in my family and I never had any children. I also don't have any sisters. I don't think it is necessary."  We can certainly refer her in the future if she changes her mind.         Dispo:  -Continue port flush every 2 months.  -Return to cancer center in 4 months for follow-up with port flush/labs.      All questions were answered to patient's stated satisfaction. Encouraged patient to call with any new concerns or questions before her next visit to the cancer center and we can certain see her sooner, if needed.    Plan of care discussed with Dr. Talbert Cage, who agrees with the above aforementioned.    Orders placed this encounter:  Orders Placed This Encounter  Procedures  . CBC with Differential/Platelet  . Comprehensive metabolic panel      Mike Craze, NP [place] [phone]

## 2017-10-03 ENCOUNTER — Encounter (HOSPITAL_COMMUNITY): Payer: Self-pay | Admitting: Adult Health

## 2017-10-03 ENCOUNTER — Encounter (HOSPITAL_COMMUNITY): Payer: BLUE CROSS/BLUE SHIELD

## 2017-10-03 ENCOUNTER — Other Ambulatory Visit: Payer: Self-pay

## 2017-10-03 ENCOUNTER — Encounter (HOSPITAL_COMMUNITY): Payer: BLUE CROSS/BLUE SHIELD | Attending: Adult Health | Admitting: Adult Health

## 2017-10-03 VITALS — BP 151/63 | HR 95 | Temp 98.1°F | Resp 18 | Wt 166.5 lb

## 2017-10-03 DIAGNOSIS — Z171 Estrogen receptor negative status [ER-]: Secondary | ICD-10-CM

## 2017-10-03 DIAGNOSIS — Z923 Personal history of irradiation: Secondary | ICD-10-CM | POA: Diagnosis not present

## 2017-10-03 DIAGNOSIS — Z853 Personal history of malignant neoplasm of breast: Secondary | ICD-10-CM | POA: Diagnosis not present

## 2017-10-03 DIAGNOSIS — C50919 Malignant neoplasm of unspecified site of unspecified female breast: Secondary | ICD-10-CM | POA: Insufficient documentation

## 2017-10-03 DIAGNOSIS — N6459 Other signs and symptoms in breast: Secondary | ICD-10-CM | POA: Diagnosis not present

## 2017-10-03 DIAGNOSIS — Z9221 Personal history of antineoplastic chemotherapy: Secondary | ICD-10-CM | POA: Diagnosis not present

## 2017-10-03 LAB — CBC WITH DIFFERENTIAL/PLATELET
Basophils Absolute: 0 10*3/uL (ref 0.0–0.1)
Basophils Relative: 0 %
Eosinophils Absolute: 0.1 10*3/uL (ref 0.0–0.7)
Eosinophils Relative: 2 %
HCT: 36.6 % (ref 36.0–46.0)
Hemoglobin: 11.8 g/dL — ABNORMAL LOW (ref 12.0–15.0)
Lymphocytes Relative: 17 %
Lymphs Abs: 1.2 10*3/uL (ref 0.7–4.0)
MCH: 30.4 pg (ref 26.0–34.0)
MCHC: 32.2 g/dL (ref 30.0–36.0)
MCV: 94.3 fL (ref 78.0–100.0)
Monocytes Absolute: 0.7 10*3/uL (ref 0.1–1.0)
Monocytes Relative: 11 %
Neutro Abs: 4.7 10*3/uL (ref 1.7–7.7)
Neutrophils Relative %: 70 %
Platelets: 264 10*3/uL (ref 150–400)
RBC: 3.88 MIL/uL (ref 3.87–5.11)
RDW: 13 % (ref 11.5–15.5)
WBC: 6.7 10*3/uL (ref 4.0–10.5)

## 2017-10-03 LAB — COMPREHENSIVE METABOLIC PANEL
ALT: 22 U/L (ref 14–54)
AST: 22 U/L (ref 15–41)
Albumin: 4.1 g/dL (ref 3.5–5.0)
Alkaline Phosphatase: 113 U/L (ref 38–126)
Anion gap: 10 (ref 5–15)
BUN: 13 mg/dL (ref 6–20)
CO2: 27 mmol/L (ref 22–32)
Calcium: 9.5 mg/dL (ref 8.9–10.3)
Chloride: 103 mmol/L (ref 101–111)
Creatinine, Ser: 0.78 mg/dL (ref 0.44–1.00)
GFR calc Af Amer: >60 mL/min (ref >60)
GFR calc non Af Amer: >60 mL/min (ref >60)
Glucose, Bld: 81 mg/dL (ref 65–99)
Potassium: 3.5 mmol/L (ref 3.5–5.1)
Sodium: 140 mmol/L (ref 135–145)
Total Bilirubin: 0.4 mg/dL (ref 0.3–1.2)
Total Protein: 7.5 g/dL (ref 6.5–8.1)

## 2017-10-03 MED ORDER — HEPARIN SOD (PORK) LOCK FLUSH 100 UNIT/ML IV SOLN
500.0000 [IU] | Freq: Once | INTRAVENOUS | Status: AC
  Administered 2017-10-03: 500 [IU] via INTRAVENOUS

## 2017-10-03 MED ORDER — SODIUM CHLORIDE 0.9% FLUSH
10.0000 mL | INTRAVENOUS | Status: DC | PRN
  Administered 2017-10-03: 10 mL via INTRAVENOUS
  Filled 2017-10-03: qty 10

## 2017-10-03 NOTE — Patient Instructions (Addendum)
[place] at [facility] Discharge Instructions  RECOMMENDATIONS MADE BY THE CONSULTANT AND ANY TEST RESULTS WILL BE SENT TO YOUR REFERRING PHYSICIAN.  You saw Kimberly Craze, NP, today Follow up in 4 months with labs Port flush every 2 months See Andee Poles at checkout for appointments.   Thank you for choosing [place] at [facility] to provide your oncology and hematology care.  To afford each patient quality time with our provider, please arrive at least 15 minutes before your scheduled appointment time.    If you have a lab appointment with the [facility] please come in thru the  Main Entrance and check in at the main information desk  You need to re-schedule your appointment should you arrive 10 or more minutes late.  We strive to give you quality time with our providers, and arriving late affects you and other patients whose appointments are after yours.  Also, if you no show three or more times for appointments you may be dismissed from the clinic at the providers discretion.     Again, thank you for choosing [facility].  Our hope is that these requests will decrease the amount of time that you wait before being seen by our physicians.       _____________________________________________________________  Should you have questions after your visit to [facility], please contact our office at [phone] between the hours of 8:30 a.m. and 4:30 p.m.  Voicemails left after 4:30 p.m. will not be returned until the following business day.  For prescription refill requests, have your pharmacy contact our office.       Resources For Cancer Patients and their Caregivers ? American Cancer Society: Can assist with transportation, wigs, general needs, runs Look Good Feel Better.        479-484-0092 ? Cancer Care: Provides financial assistance, online support groups, medication/co-pay assistance.   1-800-813-HOPE (586)712-9888) ? [facility] Assists [place] cancer patients and their families through emotional , educational and financial support.  (867) 784-8394 ? [place] DSS Where to apply for food stamps, Medicaid and utility assistance. 219-557-2633 ? RCATS: Transportation to medical appointments. 804 303 2798 ? Social Security Administration: May apply for disability if have a Stage IV cancer. (548)112-3320 434-286-8529 ? [employer], Disability and Transit Services: Assists with nutrition, care and transit needs. [place] Support Programs: @10RELATIVEDAYS @ > Cancer Support Group  2nd Tuesday of the month 1pm-2pm, Journey Room  > Creative Journey  3rd Tuesday of the month 1130am-1pm, Journey Room  > Look Good Feel Better  1st Wednesday of the month 10am-12 noon, Journey Room (Call [place] to register [phone])

## 2017-10-03 NOTE — Progress Notes (Signed)
Kimberly Roman presented for Portacath access and flush.  Proper placement of portacath confirmed by CXR.  Portacath located left chest wall accessed with  H 20 needle.  Good blood return present. Portacath flushed with 80ml NS and 500U/67ml Heparin and needle removed intact.  Procedure tolerated well and without incident.  Discharged ambulatory.

## 2017-12-03 ENCOUNTER — Other Ambulatory Visit: Payer: Self-pay

## 2017-12-03 ENCOUNTER — Inpatient Hospital Stay (HOSPITAL_COMMUNITY): Payer: BLUE CROSS/BLUE SHIELD | Attending: Adult Health

## 2017-12-03 ENCOUNTER — Encounter (HOSPITAL_COMMUNITY): Payer: Self-pay

## 2017-12-03 DIAGNOSIS — Z452 Encounter for adjustment and management of vascular access device: Secondary | ICD-10-CM | POA: Diagnosis not present

## 2017-12-03 DIAGNOSIS — Z853 Personal history of malignant neoplasm of breast: Secondary | ICD-10-CM | POA: Insufficient documentation

## 2017-12-03 MED ORDER — SODIUM CHLORIDE 0.9% FLUSH
10.0000 mL | INTRAVENOUS | Status: DC | PRN
  Administered 2017-12-03: 10 mL via INTRAVENOUS
  Filled 2017-12-03: qty 10

## 2017-12-03 MED ORDER — HEPARIN SOD (PORK) LOCK FLUSH 100 UNIT/ML IV SOLN
500.0000 [IU] | Freq: Once | INTRAVENOUS | Status: AC
  Administered 2017-12-03: 500 [IU] via INTRAVENOUS
  Filled 2017-12-03: qty 5

## 2017-12-03 NOTE — Progress Notes (Signed)
Kimberly Roman presented for Portacath access and flush. Portacath located left chest wall accessed with  H 20 needle. Good blood return present. Portacath flushed with 83ml NS and 500U/75ml Heparin and needle removed intact. Procedure without incident. Patient tolerated procedure well.  Treatment given per orders. Patient tolerated it well without problems. Vitals stable and discharged home from clinic ambulatory. Follow up as scheduled.

## 2017-12-03 NOTE — Patient Instructions (Signed)
[facility] at [facility] Discharge Instructions  RECOMMENDATIONS MADE BY THE CONSULTANT AND ANY TEST RESULTS WILL BE SENT TO YOUR REFERRING PHYSICIAN.  Port flush done  Follow up as scheduled.  Thank you for choosing [facility] at [facility] to provide your oncology and hematology care.  To afford each patient quality time with our provider, please arrive at least 15 minutes before your scheduled appointment time.    If you have a lab appointment with the Cancer Center please come in thru the  Main Entrance and check in at the main information desk  You need to re-schedule your appointment should you arrive 10 or more minutes late.  We strive to give you quality time with our providers, and arriving late affects you and other patients whose appointments are after yours.  Also, if you no show three or more times for appointments you may be dismissed from the clinic at the providers discretion.     Again, thank you for choosing [facility].  Our hope is that these requests will decrease the amount of time that you wait before being seen by our physicians.       _____________________________________________________________  Should you have questions after your visit to [facility], please contact our office at [phone] between the hours of 8:30 a.m. and 4:30 p.m.  Voicemails left after 4:30 p.m. will not be returned until the following business day.  For prescription refill requests, have your pharmacy contact our office.       Resources For Cancer Patients and their Caregivers ? American Cancer Society: Can assist with transportation, wigs, general needs, runs Look Good Feel Better.        1-888-227-6333 ? Cancer Care: Provides financial assistance, online support groups, medication/co-pay assistance.  1-800-813-HOPE (4673) ? [facility] Assists [place] cancer patients and their  families through emotional , educational and financial support.  336-427-4357 ? [place] DSS Where to apply for food stamps, Medicaid and utility assistance. 336-342-1394 ? RCATS: Transportation to medical appointments. 336-347-2287 ? Social Security Administration: May apply for disability if have a Stage IV cancer. 336-342-7796 1-800-772-1213 ? [place] Aging, Disability and Transit Services: Assists with nutrition, care and transit needs. 336-349-2343  Cancer Center Support Programs: @10RELATIVEDAYS@ > Cancer Support Group  2nd Tuesday of the month 1pm-2pm, Journey Room  > Creative Journey  3rd Tuesday of the month 1130am-1pm, Journey Room  > Look Good Feel Better  1st Wednesday of the month 10am-12 noon, Journey Room (Call American Cancer Society to register 1-800-395-5775)   

## 2017-12-05 MED ORDER — FULVESTRANT 250 MG/5ML IM SOLN
INTRAMUSCULAR | Status: AC
  Filled 2017-12-05: qty 10

## 2017-12-05 MED ORDER — ACETAMINOPHEN 325 MG PO TABS
ORAL_TABLET | ORAL | Status: AC
  Filled 2017-12-05: qty 2

## 2018-01-09 ENCOUNTER — Telehealth (HOSPITAL_COMMUNITY): Payer: Self-pay

## 2018-01-09 NOTE — Telephone Encounter (Signed)
Notified patient and message sent to scheduling.

## 2018-01-09 NOTE — Telephone Encounter (Signed)
Patient called wanting to let Mike Craze, NP, know that she has had some soreness around her right nipple and also noticed some discharge. She states Elzie Rings told her to call if this happened again. She states it happened once in November and then again this week. She has only noticed the discharge once this week. She states it does not happen on a daily basis. Denies fever or trauma to that area. Explained to patient I would review with Elzie Rings and call her back. She verbalized understanding.

## 2018-01-09 NOTE — Telephone Encounter (Signed)
Can she come in sometime tomorrow for me or nursing to take a look at it?  We may need to culture the fluid coming from the nipple, but let's have her come in as soon as she's able.   Mike Craze, NP [place] [phone]

## 2018-01-10 ENCOUNTER — Inpatient Hospital Stay (HOSPITAL_COMMUNITY): Payer: BLUE CROSS/BLUE SHIELD | Attending: Adult Health | Admitting: Adult Health

## 2018-01-10 ENCOUNTER — Encounter (HOSPITAL_COMMUNITY): Payer: Self-pay | Admitting: Adult Health

## 2018-01-10 VITALS — BP 161/84 | HR 78 | Temp 98.2°F | Resp 16 | Wt 170.8 lb

## 2018-01-10 DIAGNOSIS — Z853 Personal history of malignant neoplasm of breast: Secondary | ICD-10-CM

## 2018-01-10 DIAGNOSIS — N6452 Nipple discharge: Secondary | ICD-10-CM

## 2018-01-10 DIAGNOSIS — C50911 Malignant neoplasm of unspecified site of right female breast: Secondary | ICD-10-CM

## 2018-01-10 DIAGNOSIS — N6310 Unspecified lump in the right breast, unspecified quadrant: Secondary | ICD-10-CM

## 2018-01-10 NOTE — Patient Instructions (Addendum)
[place] at [facility] Discharge Instructions  RECOMMENDATIONS MADE BY THE CONSULTANT AND ANY TEST RESULTS WILL BE SENT TO YOUR REFERRING PHYSICIAN.  You saw Mike Craze, NP, today See Amy at checkout for appointments.  R) diagnostic mammogram and ultrasound ASAP -RTC for FU with Higgs as scheduled on 01/28/18 (imaging needs to be done before this visit)     Thank you for choosing [place] at [facility] to provide your oncology and hematology care.  To afford each patient quality time with our provider, please arrive at least 15 minutes before your scheduled appointment time.    If you have a lab appointment with the [place] please come in thru the  Main Entrance and check in at the main information desk  You need to re-schedule your appointment should you arrive 10 or more minutes late.  We strive to give you quality time with our providers, and arriving late affects you and other patients whose appointments are after yours.  Also, if you no show three or more times for appointments you may be dismissed from the clinic at the providers discretion.     Again, thank you for choosing [facility].  Our hope is that these requests will decrease the amount of time that you wait before being seen by our physicians.       _____________________________________________________________  Should you have questions after your visit to [facility], please contact our office at [phone] between the hours of 8:30 a.m. and 4:30 p.m.  Voicemails left after 4:30 p.m. will not be returned until the following business day.  For prescription refill requests, have your pharmacy contact our office.       Resources For Cancer Patients and their Caregivers ? American Cancer Society: Can assist with transportation, wigs, general needs, runs Look Good Feel Better.        706-355-5947 ? Cancer Care: Provides financial  assistance, online support groups, medication/co-pay assistance.  1-800-813-HOPE 414-023-5811) ? [place] Assists [place] cancer patients and their families through emotional , educational and financial support.  973-705-3344 ? [place] DSS Where to apply for food stamps, Medicaid and utility assistance. (806) 017-2520 ? RCATS: Transportation to medical appointments. 959 710 4139 ? Social Security Administration: May apply for disability if have a Stage IV cancer. [phone] 585-141-6462 ? [employer], Disability and Transit Services: Assists with nutrition, care and transit needs. [place] Support Programs: @10RELATIVEDAYS @ > Cancer Support Group  2nd Tuesday of the month 1pm-2pm, Journey Room  > Creative Journey  3rd Tuesday of the month 1130am-1pm, Journey Room  > Look Good Feel Better  1st Wednesday of the month 10am-12 noon, Journey Room (Call [place] to register [phone])

## 2018-01-10 NOTE — Progress Notes (Signed)
[place], [place] [address]   CLINIC:  Medical Oncology/Hematology  PCP:  Rosita Fire, MD [place] [address] [phone]   REASON FOR VISIT:  Follow-up for Stage IA (T1cN0M0) invasive ductal carcinoma of right breast; ER-/PR-/HER2-  CURRENT THERAPY: Surveillance per NCCN Guidelines    BRIEF ONCOLOGIC HISTORY:    Invasive ductal carcinoma of breast ([place])   08/13/2016 Mammogram    BI-RADS CATEGORY  0: Incomplete.      08/21/2016 Mammogram    BI-RADS CATEGORY  5: Highly suggestive of malignancy. Suspicious 1.2 cm mass 10 o'clock position right breast.      08/21/2016 Breast US    Targeted ultrasound is performed, showing a hypoechoic irregular mass with indistinct margins at 10 o'clock position 9 cm from the nipple measuring approximately 1.1 x 1.2 x 1.0 cm. No additional masses are seen in this region of the right breast on ultrasound.      08/28/2016 Procedure    Right needle core biopsy      09/26/2016 Procedure    Right upper outer quadrant lumpectomy by Dr. Arnoldo Morale      09/28/2016 Pathology Results    Invasive ductal carcinoma, grade 3, standing 1.7 cm. High-grade ductal carcinoma in situ with comedonecrosis. Invasive carcinoma in less than 0.1 cm of the superior medial margin focally. In situ carcinoma is less than 0.1 cm of superior medial margin and the posterior margin focally. No LVI. ER negative. PR negative. HER-2 negative. KI-67 90%.      09/28/2016 Cancer Staging    Invasive ductal carcinoma of breast [facility])   Staging form: Breast, AJCC 7th Edition   - Clinical stage from 09/28/2016: Stage IA (T1c, N0, M0) - Signed by Baird Cancer, PA-C on 10/22/2016      09/29/2016 Pathology Results    Invasive ductal carcinoma, grade 2/3. HER-2 negative. ER negative. PR negative. Ki-67 90%.      10/24/2016 Echocardiogram    - Left ventricle: The cavity size was normal. Wall thickness was   normal.  Systolic function was normal. The estimated ejection   fraction was in the range of 60% to 65%. Wall motion was normal;   there were no regional wall motion abnormalities.       10/26/2016 Procedure    Port placed placed by Dr. Arnoldo Morale      11/02/2016 -  Chemotherapy    The patient had DOXOrubicin (ADRIAMYCIN) chemo injection 106 mg, 60 mg/m2 = 106 mg, Intravenous,  Once, 4 of 4 cycles  palonosetron (ALOXI) injection 0.25 mg, 0.25 mg, Intravenous,  Once, 4 of 4 cycles  pegfilgrastim (NEULASTA ONPRO KIT) injection 6 mg, 6 mg, Subcutaneous, Once, 4 of 4 cycles  cyclophosphamide (CYTOXAN) 1,060 mg in sodium chloride 0.9 % 250 mL chemo infusion, 600 mg/m2 = 1,060 mg, Intravenous,  Once, 4 of 4 cycles  fosaprepitant (EMEND) 150 mg, dexamethasone (DECADRON) 12 mg in sodium chloride 0.9 % 145 mL IVPB, , Intravenous,  Once, 4 of 4 cycles  PACLitaxel (TAXOL) 138 mg in dextrose 5 % 250 mL chemo infusion ( ondansetron (ZOFRAN) 8 mg in sodium chloride 0.9 % 50 mL IVPB, , Intravenous,  Once, 4 of 12 cycles  for chemotherapy treatment.        03/28/2017 - 04/26/2017 Radiation Therapy    Adjuvant breast radiation [facility]). Right breast 3D CRT 6x photons 16 fractions at 2.65 GY/f to 42.4 Gy TB boost 3 field 6X photons 4 fractions  at 2.5 Gy/f to 10 GY             INTERVAL HISTORY:  Ms. Closson presents for new right breast nipple discharge.   Here today with her mother.   She called our clinic yesterday with concerns of new right breast nipple discharge.  She is seen today as a work-in visit for this new complaint.  This is the second occurrence of right nipple discharge per her report.  The first time it happened was in 09/2017; her mammogram around this time was normal. The second occurrence started 2 days ago.  She notes a clear yellow drainage from the nipple that she finds on her bra.  The discharge is spontaneous. "I never know when it's going to do it."  Denies any bleeding from the nipple.   She denies any fever or chills.  She states that her right breast edema is improving," has mostly returned back to normal."    REVIEW OF SYSTEMS:  Review of Systems  Constitutional: Positive for fatigue. Negative for chills and fever.  HENT:  Negative.  Negative for lump/mass and nosebleeds.   Eyes: Negative.   Respiratory: Negative.  Negative for cough and shortness of breath.   Cardiovascular: Negative.  Negative for chest pain and leg swelling.  Gastrointestinal: Negative.  Negative for abdominal pain, blood in stool, constipation, diarrhea, nausea and vomiting.  Endocrine: Negative.   Genitourinary: Negative.  Negative for dysuria and hematuria.   Musculoskeletal: Negative.  Negative for arthralgias.  Skin: Negative.  Negative for rash.  Neurological: Negative.  Negative for dizziness and headaches.  Hematological: Negative.  Negative for adenopathy. Does not bruise/bleed easily.  Psychiatric/Behavioral: Negative.  Negative for depression and sleep disturbance. The patient is not nervous/anxious.      PAST MEDICAL/SURGICAL HISTORY:  Past Medical History:  Diagnosis Date  . Ankylosing spondylitis ([employer])   . Arthritis   . Breast cancer ([place])   . Carpal tunnel syndrome   . Environmental allergies   . GERD (gastroesophageal reflux disease)   . Hyperlipemia   . Hypertension   . Invasive ductal carcinoma of breast ([place]) 10/22/2016   Past Surgical History:  Procedure Laterality Date  . ABDOMINAL HYSTERECTOMY    . CARPAL TUNNEL RELEASE Right 04/27/2013   Procedure: RIGHT CARPAL TUNNEL RELEASE;  Surgeon: Carole Civil, MD;  Location: [facility] ORS;  Service: Orthopedics;  Laterality: Right;  . COLONOSCOPY N/A 01/20/2013   Procedure: COLONOSCOPY;  Surgeon: Danie Binder, MD;  Location: [facility] ENDO SUITE;  Service: Endoscopy;  Laterality: N/A;  9:30 AM  . HEMATOMA EVACUATION Right 10/26/2016   Procedure: EVACUATION HEMATOMA;  Surgeon: Aviva Signs, MD;  Location: [facility] ORS;  Service: General;   Laterality: Right;  . PARTIAL MASTECTOMY WITH AXILLARY SENTINEL LYMPH NODE BIOPSY Right 09/26/2016   Procedure: PARTIAL MASTECTOMY WITH AXILLARY SENTINEL LYMPH NODE BIOPSY;  Surgeon: Aviva Signs, MD;  Location: [facility] ORS;  Service: General;  Laterality: Right;  . PORTACATH PLACEMENT Left 10/26/2016   Procedure: INSERTION PORT-A-CATH;  Surgeon: Aviva Signs, MD;  Location: [facility] ORS;  Service: General;  Laterality: Left;     SOCIAL HISTORY:  Social History   Socioeconomic History  . Marital status: Single    Spouse name: Not on file  . Number of children: 0  . Years of education: 58  . Highest education level: Some college, no degree  Social Needs  . Financial resource strain: Not hard at all  . Food insecurity - worry: Never true  . Food  insecurity - inability: Never true  . Transportation needs - medical: No  . Transportation needs - non-medical: No  Occupational History  . Not on file  Tobacco Use  . Smoking status: Never Smoker  . Smokeless tobacco: Never Used  Substance and Sexual Activity  . Alcohol use: No  . Drug use: No  . Sexual activity: Not on file  Other Topics Concern  . Not on file  Social History Narrative  . Not on file    FAMILY HISTORY:  Family History  Problem Relation Age of Onset  . Arthritis Unknown   . Hypertension Mother   . Hypertension Father   . Colon cancer Neg Hx     CURRENT MEDICATIONS:  Outpatient Encounter Medications as of 01/10/2018  Medication Sig  . acetaminophen (TYLENOL) 650 MG CR tablet Take 650 mg by mouth every 8 (eight) hours as needed for pain.  Marland Kitchen amLODipine (NORVASC) 5 MG tablet Take 5 mg by mouth daily.  . Artificial Tear Solution (SOOTHE XP) SOLN Apply 2 drops to eye 3 (three) times daily as needed (for dry/irritated eyes).   Marland Kitchen omeprazole (PRILOSEC) 20 MG capsule Take 20 mg by mouth every morning.   . sodium chloride (OCEAN) 0.65 % SOLN nasal spray Place 1 spray into both nostrils 4 (four) times daily as needed (for  dry/congestion/allergies.).  . [DISCONTINUED] prochlorperazine (COMPAZINE) 10 MG tablet Take 1 tablet (10 mg total) by mouth every 6 (six) hours as needed (Nausea or vomiting).   No facility-administered encounter medications on file as of 01/10/2018.     ALLERGIES:  Allergies  Allergen Reactions  . Atorvastatin     Other reaction(s): Other (See Comments) REACTION: Cramping in legs/lower extremties  . Lipitor [Atorvastatin Calcium] Other (See Comments)    REACTION: Cramping in legs/lower extremties     PHYSICAL EXAM:  ECOG Performance status: 0-1 - Mildly symptomatic; remains completely independent   Vitals:   01/10/18 1422  BP: (!) 161/84  Pulse: 78  Resp: 16  Temp: 98.2 F (36.8 C)  SpO2: 100%   Filed Weights   01/10/18 1422  Weight: 170 lb 12.8 oz (77.5 kg)      Physical Exam  Constitutional: She is oriented to person, place, and time and well-developed, well-nourished, and in no distress.  HENT:  Head: Normocephalic.  Eyes: Conjunctivae are normal. No scleral icterus.  Pulmonary/Chest: No respiratory distress.    Musculoskeletal: Normal range of motion.  Neurological: She is alert and oriented to person, place, and time.  Skin: Skin is warm and dry.  Psychiatric: Mood, memory, affect and judgment normal.  Nursing note and vitals reviewed.    LABORATORY DATA:  I have reviewed the labs as listed.  CBC    Component Value Date/Time   WBC 6.7 10/03/2017 1145   RBC 3.88 10/03/2017 1145   HGB 11.8 (L) 10/03/2017 1145   HCT 36.6 10/03/2017 1145   PLT 264 10/03/2017 1145   MCV 94.3 10/03/2017 1145   MCH 30.4 10/03/2017 1145   MCHC 32.2 10/03/2017 1145   RDW 13.0 10/03/2017 1145   LYMPHSABS 1.2 10/03/2017 1145   MONOABS 0.7 10/03/2017 1145   EOSABS 0.1 10/03/2017 1145   BASOSABS 0.0 10/03/2017 1145   CMP Latest Ref Rng & Units 10/03/2017 08/08/2017 06/12/2017  Glucose 65 - 99 mg/dL 81 91 97  BUN 6 - 20 mg/dL _0 Creatinine 0.44 - 1.00 mg/dL 0.78  0.78 1.33(H)  Sodium 135 - 145 mmol/L 140 139 138  Potassium 3.5 - 5.1 mmol/L 3.5 3.6 4.3  Chloride 101 - 111 mmol/L 103 105 108  CO2 22 - 32 mmol/L _0 Calcium 8.9 - 10.3 mg/dL 9.5 9.6 9.6  Total Protein 6.5 - 8.1 g/dL 7.5 7.6 8.1  Total Bilirubin 0.3 - 1.2 mg/dL 0.4 0.5 0.3  Alkaline Phos 38 - 126 U/L 113 87 83  AST 15 - 41 U/L _1 ALT 14 - 54 U/L _2 PENDING LABS:    DIAGNOSTIC IMAGING:  *The following diagnostic imaging has been reviewed and I agree with the radiology reports as listed below.  Mammogram: 10/01/17 CLINICAL DATA:  62 year old female with history of triple negative right breast cancer post lumpectomy 09/26/2016 followup by chemotherapy and radiation.  EXAM: 2D DIGITAL DIAGNOSTIC BILATERAL MAMMOGRAM WITH CAD AND ADJUNCT TOMO  COMPARISON:  Previous exam(s).  ACR Breast Density Category c: The breast tissue is heterogeneously dense, which may obscure small masses.  FINDINGS: No suspicious masses or calcifications are seen in either breast. New postsurgical/post treatment changes are present with diffuse skin and trabecular thickening as well as surgical scar in the upper-outer posterior right breast related to interval lumpectomy. Spot compression magnification tangential view of the lumpectomy site in the right breast was performed. There is no mammographic evidence of locally recurrent malignancy.  Mammographic images were processed with CAD.  IMPRESSION: New postsurgical site right breast. There are no findings of malignancy in either breast.  RECOMMENDATION: Diagnostic mammogram is suggested in 1 year. (Code:[id number]-B-01Y)  I have discussed the findings and recommendations with the patient. Results were also provided in writing at the conclusion of the visit. If applicable, a reminder letter will be sent to the patient regarding the next appointment.  BI-RADS CATEGORY  2: Benign.   Electronically Signed   By:  Everlean Alstrom M.D.   On: 10/01/2017 10:52    PATHOLOGY:  Right breast lumpectomy surgical path: 09/26/16               ASSESSMENT & PLAN:   Stage IA (T1cN0M0) invasive ductal carcinoma of right breast; ER-/PR-/HER2-: -Diagnosed in 08/2016.  Treated with lumpectomy and adjuvant chemotherapy with Adriamycin/Cytoxan x 4 cycles. Completed 9 of 12 planned cycles of weekly Taxol; chemo course was complicated by grade 3 peripheral neuropathy and Taxol was subsequently discontinued after cycle #9.  Completed chemo on 02/21/17.  Went on to complete adjuvant breast radiation in [place] on 04/26/17. She required PRBC transfusion during her radiation therapy for significant anemia.  -No role for anti-estrogen therapy given triple negative disease.  -Most recent mammogram completed on 10/01/17 and negative for malignancy.   New (R) nipple discharge and (R) breast nodule:  -Small, sub-cm nodule palpable at areolar ridge at approx 10 o'clock position of right breast.  Unable to express nipple discharge with the patient both in the lying and seated positions; therefore unable to send nipple discharge sample to lab for evaluation.  -Recommended obtaining repeat (R) diagnostic mammogram with ultrasound for further evaluation.  Shared with her that she may have developed seroma post-lumpectomy and radiation therapy; this may be the source of her intermittent spontaneous nipple drainage.  Although it would be rare for recurrent breast cancer this soon after completion of therapy, will obtain additional imaging to rule this out given new (R) breast nodularity and nipple discharge.  Orders placed today for (R) breast diagnostic mammogram with ultrasound.    Port-a-cath maintenance:  -Continue port flush every 2  months.       Dispo:  -(R) breast diagnostic mammogram and ultrasound as soon as able.  -Return to cancer center on 01/28/18 for follow-up as previously scheduled.      All questions were  answered to patient's stated satisfaction. Encouraged patient to call with any new concerns or questions before her next visit to the cancer center and we can certain see her sooner, if needed.       Orders placed this encounter:  Orders Placed This Encounter  Procedures  . MM DIAG BREAST TOMO UNI RIGHT  . US BREAST LTD UNI RIGHT INC AXILLA      Marrissa Dai, NP [place] [phone]

## 2018-01-21 ENCOUNTER — Ambulatory Visit (HOSPITAL_COMMUNITY)
Admission: RE | Admit: 2018-01-21 | Discharge: 2018-01-21 | Disposition: A | Discharge status: Home / Self Care | Payer: BLUE CROSS/BLUE SHIELD | Source: Ambulatory Visit | Attending: Adult Health | Admitting: Adult Health

## 2018-01-21 ENCOUNTER — Encounter (HOSPITAL_COMMUNITY): Payer: Self-pay

## 2018-01-21 DIAGNOSIS — Z923 Personal history of irradiation: Secondary | ICD-10-CM | POA: Diagnosis not present

## 2018-01-21 DIAGNOSIS — N6312 Unspecified lump in the right breast, upper inner quadrant: Secondary | ICD-10-CM | POA: Insufficient documentation

## 2018-01-21 DIAGNOSIS — N6310 Unspecified lump in the right breast, unspecified quadrant: Secondary | ICD-10-CM

## 2018-01-21 DIAGNOSIS — C50911 Malignant neoplasm of unspecified site of right female breast: Secondary | ICD-10-CM

## 2018-01-21 DIAGNOSIS — N6314 Unspecified lump in the right breast, lower inner quadrant: Secondary | ICD-10-CM | POA: Insufficient documentation

## 2018-01-21 DIAGNOSIS — N6452 Nipple discharge: Secondary | ICD-10-CM

## 2018-01-21 DIAGNOSIS — D241 Benign neoplasm of right breast: Secondary | ICD-10-CM | POA: Diagnosis not present

## 2018-01-22 ENCOUNTER — Other Ambulatory Visit (HOSPITAL_COMMUNITY): Payer: Self-pay | Admitting: Adult Health

## 2018-01-22 DIAGNOSIS — N6452 Nipple discharge: Secondary | ICD-10-CM

## 2018-01-22 DIAGNOSIS — N6310 Unspecified lump in the right breast, unspecified quadrant: Secondary | ICD-10-CM

## 2018-01-22 DIAGNOSIS — C50911 Malignant neoplasm of unspecified site of right female breast: Secondary | ICD-10-CM

## 2018-01-31 ENCOUNTER — Encounter (HOSPITAL_COMMUNITY): Payer: Self-pay

## 2018-01-31 ENCOUNTER — Inpatient Hospital Stay (HOSPITAL_COMMUNITY): Payer: BLUE CROSS/BLUE SHIELD | Admitting: Internal Medicine

## 2018-01-31 ENCOUNTER — Inpatient Hospital Stay (HOSPITAL_COMMUNITY): Payer: BLUE CROSS/BLUE SHIELD | Attending: Adult Health

## 2018-01-31 ENCOUNTER — Encounter (HOSPITAL_COMMUNITY): Payer: Self-pay | Admitting: Internal Medicine

## 2018-01-31 DIAGNOSIS — Z452 Encounter for adjustment and management of vascular access device: Secondary | ICD-10-CM | POA: Insufficient documentation

## 2018-01-31 DIAGNOSIS — Z853 Personal history of malignant neoplasm of breast: Secondary | ICD-10-CM | POA: Insufficient documentation

## 2018-01-31 MED ORDER — HEPARIN SOD (PORK) LOCK FLUSH 100 UNIT/ML IV SOLN
500.0000 [IU] | Freq: Once | INTRAVENOUS | Status: AC
  Administered 2018-01-31: 500 [IU] via INTRAVENOUS

## 2018-01-31 MED ORDER — SODIUM CHLORIDE 0.9% FLUSH
10.0000 mL | INTRAVENOUS | Status: DC | PRN
  Administered 2018-01-31: 10 mL via INTRAVENOUS
  Filled 2018-01-31: qty 10

## 2018-01-31 NOTE — Patient Instructions (Signed)
[facility] at [facility] Discharge Instructions  Portacath flushed per protocol today. Follow-up as scheduled. Call clinic for any questions or concerns   Thank you for choosing  [facility] Center at [facility] to provide your oncology and hematology care.  To afford each patient quality time with our provider, please arrive at least 15 minutes before your scheduled appointment time.   If you have a lab appointment with the Cancer Center please come in thru the  Main Entrance and check in at the main information desk  You need to re-schedule your appointment should you arrive 10 or more minutes late.  We strive to give you quality time with our providers, and arriving late affects you and other patients whose appointments are after yours.  Also, if you no show three or more times for appointments you may be dismissed from the clinic at the providers discretion.     Again, thank you for choosing [facility].  Our hope is that these requests will decrease the amount of time that you wait before being seen by our physicians.       _____________________________________________________________  Should you have questions after your visit to [facility], please contact our office at [phone] between the hours of 8:30 a.m. and 4:30 p.m.  Voicemails left after 4:30 p.m. will not be returned until the following business day.  For prescription refill requests, have your pharmacy contact our office.       Resources For Cancer Patients and their Caregivers ? American Cancer Society: Can assist with transportation, wigs, general needs, runs Look Good Feel Better.        1-888-227-6333 ? Cancer Care: Provides financial assistance, online support groups, medication/co-pay assistance.  1-800-813-HOPE (4673) ? [facility] Assists [place] cancer patients and their families through emotional , educational and  financial support.  336-427-4357 ? [place] DSS Where to apply for food stamps, Medicaid and utility assistance. 336-342-1394 ? RCATS: Transportation to medical appointments. 336-347-2287 ? Social Security Administration: May apply for disability if have a Stage IV cancer. 336-342-7796 1-800-772-1213 ? [place] Aging, Disability and Transit Services: Assists with nutrition, care and transit needs. 336-349-2343  Cancer Center Support Programs:   > Cancer Support Group  2nd Tuesday of the month 1pm-2pm, Journey Room   > Creative Journey  3rd Tuesday of the month 1130am-1pm, Journey Room     

## 2018-01-31 NOTE — Patient Instructions (Signed)
[facility] at [facility] Discharge Instructions  You saw Dr. Higgs today.   Thank you for choosing [facility] at [facility] to provide your oncology and hematology care.  To afford each patient quality time with our provider, please arrive at least 15 minutes before your scheduled appointment time.   If you have a lab appointment with the Cancer Center please come in thru the  Main Entrance and check in at the main information desk  You need to re-schedule your appointment should you arrive 10 or more minutes late.  We strive to give you quality time with our providers, and arriving late affects you and other patients whose appointments are after yours.  Also, if you no show three or more times for appointments you may be dismissed from the clinic at the providers discretion.     Again, thank you for choosing [facility].  Our hope is that these requests will decrease the amount of time that you wait before being seen by our physicians.       _____________________________________________________________  Should you have questions after your visit to [facility], please contact our office at [phone] between the hours of 8:30 a.m. and 4:30 p.m.  Voicemails left after 4:30 p.m. will not be returned until the following business day.  For prescription refill requests, have your pharmacy contact our office.       Resources For Cancer Patients and their Caregivers ? American Cancer Society: Can assist with transportation, wigs, general needs, runs Look Good Feel Better.        1-888-227-6333 ? Cancer Care: Provides financial assistance, online support groups, medication/co-pay assistance.  1-800-813-HOPE (4673) ? [facility] Assists [place] cancer patients and their families through emotional , educational and financial support.  336-427-4357 ? [place] DSS Where to apply for food  stamps, Medicaid and utility assistance. 336-342-1394 ? RCATS: Transportation to medical appointments. 336-347-2287 ? Social Security Administration: May apply for disability if have a Stage IV cancer. 336-342-7796 1-800-772-1213 ? [place] Aging, Disability and Transit Services: Assists with nutrition, care and transit needs. 336-349-2343  Cancer Center Support Programs:   > Cancer Support Group  2nd Tuesday of the month 1pm-2pm, Journey Room   > Creative Journey  3rd Tuesday of the month 1130am-1pm, Journey Room     

## 2018-01-31 NOTE — Progress Notes (Signed)
Mckell Riecke Winslett tolerated Portacath flush well without complaints or incident. Port accessed with 20 gauge needle with blood return noted then flushed with 10 ml NS and 5 ml Heparin easily per protocol then de-accessed. VSS pt discharged self ambulatory in satisfactory condition

## 2018-02-07 ENCOUNTER — Ambulatory Visit (HOSPITAL_COMMUNITY): Payer: BLUE CROSS/BLUE SHIELD | Admitting: Hematology

## 2018-02-11 ENCOUNTER — Other Ambulatory Visit (HOSPITAL_COMMUNITY): Payer: Self-pay | Admitting: Internal Medicine

## 2018-02-11 ENCOUNTER — Ambulatory Visit (HOSPITAL_COMMUNITY)
Admission: RE | Admit: 2018-02-11 | Discharge: 2018-02-11 | Disposition: A | Discharge status: Home / Self Care | Payer: BLUE CROSS/BLUE SHIELD | Source: Ambulatory Visit | Attending: Adult Health | Admitting: Adult Health

## 2018-02-11 ENCOUNTER — Ambulatory Visit (HOSPITAL_COMMUNITY)
Admission: RE | Admit: 2018-02-11 | Discharge: 2018-02-11 | Disposition: A | Discharge status: Home / Self Care | Payer: BLUE CROSS/BLUE SHIELD | Source: Ambulatory Visit | Attending: Internal Medicine | Admitting: Internal Medicine

## 2018-02-11 DIAGNOSIS — N6310 Unspecified lump in the right breast, unspecified quadrant: Secondary | ICD-10-CM | POA: Diagnosis present

## 2018-02-11 DIAGNOSIS — D241 Benign neoplasm of right breast: Secondary | ICD-10-CM | POA: Diagnosis not present

## 2018-02-11 DIAGNOSIS — N6452 Nipple discharge: Secondary | ICD-10-CM

## 2018-02-11 DIAGNOSIS — C50911 Malignant neoplasm of unspecified site of right female breast: Secondary | ICD-10-CM

## 2018-02-11 MED ORDER — LIDOCAINE HCL (PF) 1 % IJ SOLN
INTRAMUSCULAR | Status: AC
  Administered 2018-02-11: 3 mL
  Filled 2018-02-11: qty 5

## 2018-02-11 MED ORDER — LIDOCAINE-EPINEPHRINE (PF) 1 %-1:200000 IJ SOLN
INTRAMUSCULAR | Status: AC
  Administered 2018-02-11: 5 mL
  Filled 2018-02-11: qty 30

## 2018-02-14 ENCOUNTER — Encounter (HOSPITAL_COMMUNITY): Payer: Self-pay | Admitting: Internal Medicine

## 2018-02-14 ENCOUNTER — Inpatient Hospital Stay (HOSPITAL_COMMUNITY): Payer: BLUE CROSS/BLUE SHIELD

## 2018-02-14 ENCOUNTER — Inpatient Hospital Stay (HOSPITAL_COMMUNITY): Payer: BLUE CROSS/BLUE SHIELD | Admitting: Internal Medicine

## 2018-02-14 ENCOUNTER — Other Ambulatory Visit: Payer: Self-pay

## 2018-02-14 ENCOUNTER — Inpatient Hospital Stay (HOSPITAL_BASED_OUTPATIENT_CLINIC_OR_DEPARTMENT_OTHER): Payer: BLUE CROSS/BLUE SHIELD | Admitting: Internal Medicine

## 2018-02-14 VITALS — BP 151/80 | HR 82 | Temp 98.3°F | Resp 20 | Wt 173.9 lb

## 2018-02-14 DIAGNOSIS — Z853 Personal history of malignant neoplasm of breast: Secondary | ICD-10-CM | POA: Diagnosis not present

## 2018-02-14 DIAGNOSIS — C50911 Malignant neoplasm of unspecified site of right female breast: Secondary | ICD-10-CM

## 2018-02-14 LAB — CBC WITH DIFFERENTIAL/PLATELET
Basophils Absolute: 0 10*3/uL (ref 0.0–0.1)
Basophils Relative: 0 %
Eosinophils Absolute: 0.1 10*3/uL (ref 0.0–0.7)
Eosinophils Relative: 1 %
HCT: 38.6 % (ref 36.0–46.0)
Hemoglobin: 12.2 g/dL (ref 12.0–15.0)
Lymphocytes Relative: 19 %
Lymphs Abs: 1.5 10*3/uL (ref 0.7–4.0)
MCH: 28.9 pg (ref 26.0–34.0)
MCHC: 31.6 g/dL (ref 30.0–36.0)
MCV: 91.5 fL (ref 78.0–100.0)
Monocytes Absolute: 0.6 10*3/uL (ref 0.1–1.0)
Monocytes Relative: 7 %
Neutro Abs: 6 10*3/uL (ref 1.7–7.7)
Neutrophils Relative %: 73 %
Platelets: 287 10*3/uL (ref 150–400)
RBC: 4.22 MIL/uL (ref 3.87–5.11)
RDW: 13.4 % (ref 11.5–15.5)
WBC: 8.2 10*3/uL (ref 4.0–10.5)

## 2018-02-14 LAB — COMPREHENSIVE METABOLIC PANEL
ALT: 22 U/L (ref 14–54)
AST: 23 U/L (ref 15–41)
Albumin: 4.2 g/dL (ref 3.5–5.0)
Alkaline Phosphatase: 129 U/L — ABNORMAL HIGH (ref 38–126)
Anion gap: 11 (ref 5–15)
BUN: 14 mg/dL (ref 6–20)
CO2: 23 mmol/L (ref 22–32)
Calcium: 9.4 mg/dL (ref 8.9–10.3)
Chloride: 106 mmol/L (ref 101–111)
Creatinine, Ser: 0.72 mg/dL (ref 0.44–1.00)
GFR calc Af Amer: >60 mL/min (ref >60)
GFR calc non Af Amer: >60 mL/min (ref >60)
Glucose, Bld: 136 mg/dL — ABNORMAL HIGH (ref 65–99)
Potassium: 3.4 mmol/L — ABNORMAL LOW (ref 3.5–5.1)
Sodium: 140 mmol/L (ref 135–145)
Total Bilirubin: 0.4 mg/dL (ref 0.3–1.2)
Total Protein: 7.8 g/dL (ref 6.5–8.1)

## 2018-02-14 LAB — LACTATE DEHYDROGENASE: LDH: 194 U/L — ABNORMAL HIGH (ref 98–192)

## 2018-02-14 NOTE — Patient Instructions (Addendum)
[place] at [facility] Discharge Instructions  You were seen today by Dr. Walden Field. She went over your recent scans and pathology reports. We are going to refer you to Dr. Arnoldo Morale to discuss surgery, and discuss port removal with him as well. We will see you back in 4 weeks for follow up.   Thank you for choosing [place] at [facility] to provide your oncology and hematology care.  To afford each patient quality time with our provider, please arrive at least 15 minutes before your scheduled appointment time.   If you have a lab appointment with the [facility] please come in thru the  Main Entrance and check in at the main information desk  You need to re-schedule your appointment should you arrive 10 or more minutes late.  We strive to give you quality time with our providers, and arriving late affects you and other patients whose appointments are after yours.  Also, if you no show three or more times for appointments you may be dismissed from the clinic at the providers discretion.     Again, thank you for choosing [facility].  Our hope is that these requests will decrease the amount of time that you wait before being seen by our physicians.       _____________________________________________________________  Should you have questions after your visit to [facility], please contact our office at [phone] between the hours of 8:30 a.m. and 4:30 p.m.  Voicemails left after 4:30 p.m. will not be returned until the following business day.  For prescription refill requests, have your pharmacy contact our office.       Resources For Cancer Patients and their Caregivers ? American Cancer Society: Can assist with transportation, wigs, general needs, runs Look Good Feel Better.        773-312-6212 ? Cancer Care: Provides financial assistance, online support groups, medication/co-pay assistance.  1-800-813-HOPE  323-570-4690) ? [facility] Assists [place] cancer patients and their families through emotional , educational and financial support.  (412)057-7780 ? [place] DSS Where to apply for food stamps, Medicaid and utility assistance. (619) 435-6912 ? [facility]: Transportation to medical appointments. 365-869-4874 ? Social Security Administration: May apply for disability if have a Stage IV cancer. 819 887 2304 (515)513-6698 ? [employer], Disability and Transit Services: Assists with nutrition, care and transit needs. [place] Support Programs:   > Cancer Support Group  2nd Tuesday of the month 1pm-2pm, Journey Room   > Creative Journey  3rd Tuesday of the month 1130am-1pm, Journey Room

## 2018-02-15 LAB — CANCER ANTIGEN 15-3: CA 15-3: 13.6 U/mL (ref 0.0–25.0)

## 2018-02-15 LAB — CANCER ANTIGEN 27.29: CA 27.29: 19.1 U/mL (ref 0.0–38.6)

## 2018-02-27 ENCOUNTER — Other Ambulatory Visit: Payer: Self-pay | Admitting: General Surgery

## 2018-02-27 ENCOUNTER — Encounter: Payer: Self-pay | Admitting: General Surgery

## 2018-02-27 ENCOUNTER — Ambulatory Visit (INDEPENDENT_AMBULATORY_CARE_PROVIDER_SITE_OTHER): Payer: BLUE CROSS/BLUE SHIELD | Admitting: General Surgery

## 2018-02-27 VITALS — BP 132/88 | HR 83 | Temp 97.7°F | Ht 65.0 in | Wt 173.0 lb

## 2018-02-27 DIAGNOSIS — C50911 Malignant neoplasm of unspecified site of right female breast: Secondary | ICD-10-CM | POA: Diagnosis not present

## 2018-02-27 NOTE — Patient Instructions (Signed)
Breast Biopsy  A breast biopsy is a procedure in which a sample of suspicious breast tissue is removed from your breast. Following the procedure, the tissue or liquid that is removed from the breast is examined under a microscope to see if cancerous cells are present. You may need a breast biopsy if you have:  · Any undiagnosed breast mass (tumor).  · Nipple abnormalities, dimpling, crusting, or ulcerations.  · Abnormal discharge from the nipple, especially blood.  · Redness, swelling, and pain of the breast.  · Calcium deposits (calcifications) or abnormalities seen on a mammogram, ultrasound results, or MRI results.  · Suspicious changes in the breast seen on your mammogram.    If the breast abnormality is found to be cancerous (malignant), a breast biopsy can help to determine what the best treatment is for you. There are many different types of breast biopsies. Talk with your health care provider about your options and which type is best for you.  Tell a health care provider about:  · Any allergies you have.  · All medicines you are taking, including vitamins, herbs, eye drops, creams, and over-the-counter medicines.  · Any problems you or family members have had with anesthetic medicines.  · Any blood disorders you have.  · Any surgeries you have had.  · Any medical conditions you have.  · Whether you are pregnant or may be pregnant.  What are the risks?  Generally, this is a safe procedure. However, problems may occur, including:  · Bleeding.  · Infection.  · Discomfort. This is temporary.  · Allergic reactions to medicines.  · Bruising and swelling of the breast.  · Alteration in the shape of the breast.  · Damage to other tissues.  · Not finding the lump or abnormality.  · Needing more surgery.    What happens before the procedure?  · Plan to have someone take you home after the procedure.  · Do not use any tobacco products, such as cigarettes, chewing tobacco, and e-cigarettes. If you need help quitting,  ask your health care provider.  · Do not drink alcohol for 24 hours before the procedure.  · Ask your health care provider about:  ? Changing or stopping your regular medicines. This is especially important if you are taking diabetes medicines or blood thinners.  ? Taking medicines such as aspirin and ibuprofen. These medicines can thin your blood. Do not take these medicines before your procedure if your health care provider instructs you not to.  · Wear a good support bra to the procedure.  · Ask your health care provider how your surgical site will be marked or identified.  · You may be given antibiotic medicine to help prevent infection.  · Your health care provider may perform a procedure to place a wire (needle localization) or a seed that gives off radiation (radioactive seed localization) in the breast lump. A mammogram, ultrasound, MRI, or a combination of these techniques will be done during this procedure to identify the location of the breast abnormality. The imaging technique used will depend on the type of biopsy you are having. The wire or seed will help the health care provider locate the lump when performing the biopsy, especially if the lump cannot be felt.  What happens during the procedure?  You may be given one or both of the following:  · A medicine to numb the breast area (local anesthetic).  · A medicine to help you relax (sedative) during the procedure.      The following are the different types of biopsies that can be performed.  Fine-Needle Aspiration  A thin needle will be attached to a syringe and inserted into a breast cyst. Fluid and cells will be removed. This technique is not as common as a core needle biopsy.  Core Needle Biopsy  A wide, hollow needle (core needle) will be inserted into a breast lump multiple times to remove tissue samples or cores.  Stereotactic Biopsy  You will lie face-down on a table. Your breast will pass through an opening in the table and will be gently  compressed into a fixed position. X-ray equipment and a computer will be used to locate the breast lump. The surgeon will use this information to collect several samples of tissue using a needle collection device.  Vacuum-Assisted Biopsy  A small incision (less than ¼ inch) will be made in your breast. A biopsy device that includes a hollow needle and vacuum will be passed through the incision and into the breast tissue. The vacuum will gently draw abnormal breast tissue into the needle to remove it. No stitches (sutures) will be needed. The incision will be covered with a bandage (dressing). In this type of biopsy, a larger tissue sample is removed than in a regular core needle biopsy.  Ultrasound-Guided Core Needle Biopsy  A high-frequency ultrasound will be used to help guide the core needle to the area of the mass or abnormality. An incision will be made to insert the needle. Then tissue samples will be removed.  Surgical Biopsy  This method requires an incision in the breast to remove part or all of the suspicious tissue. After the tissue is removed, the skin over the area will be closed with sutures and covered with a dressing. There are two types of surgical biopsies:  · Incisional biopsy. The surgeon will remove part of the breast lump.  · Excisional biopsy. The surgeon will attempt to remove the whole breast lump or as much of it as possible.    After any of these procedures, the tissue or liquid that was removed will be examined under a microscope.  What happens after the procedure?  · You will be taken to the recovery area. If you are doing well and have no problems, you will be allowed to go home.  · You may notice bruising on your breast. This is normal.  · You may have a pressure dressing applied on your breast for 24-48 hours. A pressure dressing is a bandage that is wrapped tightly around the chest to stop fluid from collecting underneath tissues. You may also be advised to wear a supportive bra  during this time.  · Do not drive for 24 hours if you received a sedative.  This information is not intended to replace advice given to you by your health care provider. Make sure you discuss any questions you have with your health care provider.  Document Released: 11/12/2005 Document Revised: 03/22/2016 Document Reviewed: 08/16/2015  Elsevier Interactive Patient Education © 2018 Elsevier Inc.

## 2018-02-27 NOTE — Progress Notes (Signed)
Kimberly Roman; [id number]; 1956/07/06   HPI Patient is a 62 year old black female who was referred to my care by oncology and Dr. Legrand Rams for evaluation and treatment of a new mass in the right breast.  She is status post partial mastectomy with sentinel lymph node dissection in 2017 for right breast cancer.  She recently started having pain with a lump around the right nipple.  A core biopsy of this was performed which revealed a sclerosing papilloma.  She currently has 0 out of 10 pain.  She has had intermittent yellow discharge from the nipple.  She has finished with her chemotherapy. Past Medical History:  Diagnosis Date  . Ankylosing spondylitis ([place])   . Arthritis   . Breast cancer ([place])   . Carpal tunnel syndrome   . Environmental allergies   . GERD (gastroesophageal reflux disease)   . Hyperlipemia   . Hypertension   . Invasive ductal carcinoma of breast ([place]) 10/22/2016    Past Surgical History:  Procedure Laterality Date  . ABDOMINAL HYSTERECTOMY    . CARPAL TUNNEL RELEASE Right 04/27/2013   Procedure: RIGHT CARPAL TUNNEL RELEASE;  Surgeon: Carole Civil, MD;  Location: [facility];  Service: Orthopedics;  Laterality: Right;  . COLONOSCOPY N/A 01/20/2013   Procedure: COLONOSCOPY;  Surgeon: Danie Binder, MD;  Location: [facility];  Service: Endoscopy;  Laterality: N/A;  9:30 AM  . HEMATOMA EVACUATION Right 10/26/2016   Procedure: EVACUATION HEMATOMA;  Surgeon: Aviva Signs, MD;  Location: [facility] ORS;  Service: General;  Laterality: Right;  . PARTIAL MASTECTOMY WITH AXILLARY SENTINEL LYMPH NODE BIOPSY Right 09/26/2016   Procedure: PARTIAL MASTECTOMY WITH AXILLARY SENTINEL LYMPH NODE BIOPSY;  Surgeon: Aviva Signs, MD;  Location: [facility] ORS;  Service: General;  Laterality: Right;  . PORTACATH PLACEMENT Left 10/26/2016   Procedure: INSERTION PORT-A-CATH;  Surgeon: Aviva Signs, MD;  Location: [facility];  Service: General;  Laterality: Left;    Family History  Problem Relation Age of  Onset  . Arthritis Unknown   . Hypertension Mother   . Hypertension Father   . Colon cancer Neg Hx     Current Outpatient Medications on File Prior to Visit  Medication Sig Dispense Refill  . acetaminophen (TYLENOL) 650 MG CR tablet Take 650 mg by mouth every 8 (eight) hours as needed for pain.    Marland Kitchen amLODipine (NORVASC) 10 MG tablet Take 10 mg by mouth daily.     . Artificial Tear Solution (SOOTHE XP) SOLN Apply 2 drops to eye 3 (three) times daily as needed (for dry/irritated eyes).     Marland Kitchen omeprazole (PRILOSEC) 20 MG capsule Take 20 mg by mouth every morning.   0  . sodium chloride (OCEAN) 0.65 % SOLN nasal spray Place 1 spray into both nostrils 4 (four) times daily as needed (for dry/congestion/allergies.).    . [DISCONTINUED] prochlorperazine (COMPAZINE) 10 MG tablet Take 1 tablet (10 mg total) by mouth every 6 (six) hours as needed (Nausea or vomiting). 30 tablet 1   No current facility-administered medications on file prior to visit.     Allergies  Allergen Reactions  . Atorvastatin     Other reaction(s): Other (See Comments) REACTION: Cramping in legs/lower extremties  . Lipitor [Atorvastatin Calcium] Other (See Comments)    REACTION: Cramping in legs/lower extremties    Social History   Substance and Sexual Activity  Alcohol Use No    Social History   Tobacco Use  Smoking Status Never Smoker  Smokeless Tobacco Never Used  Review of Systems  Constitutional: Negative.   HENT: Negative.   Eyes: Negative.   Respiratory: Negative.   Cardiovascular: Negative.   Gastrointestinal: Negative.   Genitourinary: Negative.   Musculoskeletal: Negative.   Skin: Negative.   Neurological: Negative.   Endo/Heme/Allergies: Negative.   Psychiatric/Behavioral: Negative.     Objective   Vitals:   02/27/18 1111  BP: 132/88  Pulse: 83  Temp: 97.7 F (36.5 C)    Physical Exam  Constitutional: She is oriented to person, place, and time and well-developed,  well-nourished, and in no distress.  HENT:  Head: Normocephalic and atraumatic.  Cardiovascular: Normal rate, regular rhythm and normal heart sounds. Exam reveals no gallop and no friction rub.  No murmur heard. Pulmonary/Chest: Effort normal and breath sounds normal. No respiratory distress. She has no wheezes. She has no rales.  Port-A-Cath in place left upper chest  Neurological: She is alert and oriented to person, place, and time.  Walks with a cane  Skin: Skin is warm and dry.  Vitals reviewed. Breast: Dominant ovoid firmness at the 2 to 3 o'clock position in the periareolar region.  No nipple discharge identified.  Is tender to touch.  Well-healed surgical scar laterally.  Axilla negative for palpable nodes.  Left breast unremarkable.  Mammography and pathology reports reviewed.  Assessment   Newly diagnosed sclerosing papilloma of right breast Right breast carcinoma Plan   Patient is scheduled for right breast biopsy on 03/18/2018.  The risks and benefits of the procedure including bleeding, infection, and the possibility of malignancy were fully explained to the patient, who gave informed consent.  Once the pathology comes back and is negative, I will then remove the Port-A-Cath as an outpatient.

## 2018-02-27 NOTE — H&P (Signed)
Kimberly DELOACH; [id number]; 07-14-1956   HPI Patient is a 62 year old black female who was referred to my care by oncology and Dr. Legrand Rams for evaluation and treatment of a new mass in the right breast.  She is status post partial mastectomy with sentinel lymph node dissection in 2017 for right breast cancer.  She recently started having pain with a lump around the right nipple.  A core biopsy of this was performed which revealed a sclerosing papilloma.  She currently has 0 out of 10 pain.  She has had intermittent yellow discharge from the nipple.  She has finished with her chemotherapy. Past Medical History:  Diagnosis Date  . Ankylosing spondylitis ([place])   . Arthritis   . Breast cancer ([place])   . Carpal tunnel syndrome   . Environmental allergies   . GERD (gastroesophageal reflux disease)   . Hyperlipemia   . Hypertension   . Invasive ductal carcinoma of breast ([place]) 10/22/2016    Past Surgical History:  Procedure Laterality Date  . ABDOMINAL HYSTERECTOMY    . CARPAL TUNNEL RELEASE Right 04/27/2013   Procedure: RIGHT CARPAL TUNNEL RELEASE;  Surgeon: Carole Civil, MD;  Location: [facility];  Service: Orthopedics;  Laterality: Right;  . COLONOSCOPY N/A 01/20/2013   Procedure: COLONOSCOPY;  Surgeon: Danie Binder, MD;  [facility];  Service: Endoscopy;  Laterality: N/A;  9:30 AM  . HEMATOMA EVACUATION Right 10/26/2016   Procedure: EVACUATION HEMATOMA;  Surgeon: Aviva Signs, MD;  Location: [facility];  Service: General;  Laterality: Right;  . PARTIAL MASTECTOMY WITH AXILLARY SENTINEL LYMPH NODE BIOPSY Right 09/26/2016   Procedure: PARTIAL MASTECTOMY WITH AXILLARY SENTINEL LYMPH NODE BIOPSY;  Surgeon: Aviva Signs, MD;  Location: [facility];  Service: General;  Laterality: Right;  . PORTACATH PLACEMENT Left 10/26/2016   Procedure: INSERTION PORT-A-CATH;  Surgeon: Aviva Signs, MD;  Location: [facility];  Service: General;  Laterality: Left;    Family History  Problem Relation Age of  Onset  . Arthritis Unknown   . Hypertension Mother   . Hypertension Father   . Colon cancer Neg Hx     Current Outpatient Medications on File Prior to Visit  Medication Sig Dispense Refill  . acetaminophen (TYLENOL) 650 MG CR tablet Take 650 mg by mouth every 8 (eight) hours as needed for pain.    Marland Kitchen amLODipine (NORVASC) 10 MG tablet Take 10 mg by mouth daily.     . Artificial Tear Solution (SOOTHE XP) SOLN Apply 2 drops to eye 3 (three) times daily as needed (for dry/irritated eyes).     Marland Kitchen omeprazole (PRILOSEC) 20 MG capsule Take 20 mg by mouth every morning.   0  . sodium chloride (OCEAN) 0.65 % SOLN nasal spray Place 1 spray into both nostrils 4 (four) times daily as needed (for dry/congestion/allergies.).    . [DISCONTINUED] prochlorperazine (COMPAZINE) 10 MG tablet Take 1 tablet (10 mg total) by mouth every 6 (six) hours as needed (Nausea or vomiting). 30 tablet 1   No current facility-administered medications on file prior to visit.     Allergies  Allergen Reactions  . Atorvastatin     Other reaction(s): Other (See Comments) REACTION: Cramping in legs/lower extremties  . Lipitor [Atorvastatin Calcium] Other (See Comments)    REACTION: Cramping in legs/lower extremties    Social History   Substance and Sexual Activity  Alcohol Use No    Social History   Tobacco Use  Smoking Status Never Smoker  Smokeless Tobacco Never Used  Review of Systems  Constitutional: Negative.   HENT: Negative.   Eyes: Negative.   Respiratory: Negative.   Cardiovascular: Negative.   Gastrointestinal: Negative.   Genitourinary: Negative.   Musculoskeletal: Negative.   Skin: Negative.   Neurological: Negative.   Endo/Heme/Allergies: Negative.   Psychiatric/Behavioral: Negative.     Objective   Vitals:   02/27/18 1111  BP: 132/88  Pulse: 83  Temp: 97.7 F (36.5 C)    Physical Exam  Constitutional: She is oriented to person, place, and time and well-developed,  well-nourished, and in no distress.  HENT:  Head: Normocephalic and atraumatic.  Cardiovascular: Normal rate, regular rhythm and normal heart sounds. Exam reveals no gallop and no friction rub.  No murmur heard. Pulmonary/Chest: Effort normal and breath sounds normal. No respiratory distress. She has no wheezes. She has no rales.  Port-A-Cath in place left upper chest  Neurological: She is alert and oriented to person, place, and time.  Walks with a cane  Skin: Skin is warm and dry.  Vitals reviewed. Breast: Dominant ovoid firmness at the 2 to 3 o'clock position in the periareolar region.  No nipple discharge identified.  Is tender to touch.  Well-healed surgical scar laterally.  Axilla negative for palpable nodes.  Left breast unremarkable.  Mammography and pathology reports reviewed.  Assessment   Newly diagnosed sclerosing papilloma of right breast Right breast carcinoma Plan   Patient is scheduled for right breast biopsy on 03/18/2018.  The risks and benefits of the procedure including bleeding, infection, and the possibility of malignancy were fully explained to the patient, who gave informed consent.  Once the pathology comes back and is negative, I will then remove the Port-A-Cath as an outpatient.

## 2018-03-10 NOTE — Patient Instructions (Signed)
Kimberly Roman  03/10/2018     @PREFPERIOPPHARMACY @   Your procedure is scheduled on  03/18/2018 .  Report to [facility] at  640   A.M.  Call this number if you have problems the morning of surgery:  [phone]   Remember:  Do not eat food or drink liquids after midnight.  Take these medicines the morning of surgery with A SIP OF WATER  Amlodipine, claritin, omeprazole.   Do not wear jewelry, make-up or nail polish.  Do not wear lotions, powders, or perfumes, or deodorant.  Do not shave 48 hours prior to surgery.  Men may shave face and neck.  Do not bring valuables to the hospital.  [facility] is not responsible for any belongings or valuables.  Contacts, dentures or bridgework may not be worn into surgery.  Leave your suitcase in the car.  After surgery it may be brought to your room.  For patients admitted to the hospital, discharge time will be determined by your treatment team.  Patients discharged the day of surgery will not be allowed to drive home.   Name and phone number of your driver:   family Special instructions:  None Please read over the following fact sheets that you were given. Anesthesia Post-op Instructions and Care and Recovery After Surgery       Breast Biopsy A breast biopsy is a test during which a sample of tissue is taken from your breast. The breast tissue is looked at under a microscope for cancer cells. What happens before the procedure?  Plan to have someone take you home after the test.  Do not use tobacco products. These include cigarettes, chewing tobacco, or e-cigarettes. If you need help quitting, ask your doctor.  Do not drink alcohol for 24 hours before the test.  Ask your doctor about: ? Changing or stopping your normal medicines. This is important if you take diabetes medicines or blood thinners. ? Taking medicines such as aspirin and ibuprofen. These medicines can thin your blood. Do not take these  medicines before your procedure if your doctor tells you not to.  Wear a good support bra to the test.  Ask your doctor how your surgical site will be marked or identified.  You may be given antibiotic medicine to help prevent infection.  You may be checked for extra fluid in your body (lymphedema).  Your doctor may place a wire or a seed in the lump. The wire or seed gives off radiation. This will help your doctor to see the lump during the biopsy. What happens during the procedure? You may be given the following:  A medicine to numb the breast area (local anesthetic).  A medicine to help you relax (sedative).  There are different types of breast biopsies. Each type is described below. Fine-Needle Aspiration  A needle will be put into the breast lump.  The needle will take out fluid and cells from the lump. Core-Needle Biopsy  A needle will be put into the breast lump.  The needle will be put into your breast several times.  The needle will remove breast tissue. Stereotactic Biopsy  You will lie on a table on your belly. Your breast will pass through a hole in the table. Your breast will be held in place.  X-rays and a computer will be used to locate the breast lump.  A needle will be used to remove tissue samples from your breast.  Vacuum-Assisted Biopsy  A small cut (incision) will be made in your breast.  A biopsy device will be put through the cut and into the breast tissue.  The biopsy device will draw abnormal breast tissue into the biopsy device.  A large tissue sample will often be removed.  No stitches will be needed. Ultrasound-Guided Core-Needle Biopsy  Ultrasound imaging will help guide the needle into the area of the breast that is not normal.  A cut will be made in the breast. The needle will be put into the breast lump.  Tissue samples will be taken out. Surgical Biopsy  A cut will be made in the breast to remove tissue.  The cut will be  closed with stitches and covered with a bandage.  There are two types: ? Incisional biopsy. Your doctor will remove part of the breast lump. ? Excisional biopsy. Your doctor will try to remove the whole breast lump or as much as possible. All tissue or fluid samples will be looked at under a microscope. What happens after the procedure?  You will be able to go home when you are doing well and you are not having problems.  You may have bruising on your breast. This is normal.  A pressure bandage (dressing) may be put on your breast. It may be left on for 24-48 hours. This type of bandage is wrapped tightly around your chest. It helps to stop fluid from building up under tissues. You may also need to wear a supportive bra during this time.  Do not drive for 24 hours if you received a sedative. This information is not intended to replace advice given to you by your health care provider. Make sure you discuss any questions you have with your health care provider. Document Released: 02/04/2012 Document Revised: 07/19/2016 Document Reviewed: 08/16/2015 Elsevier Interactive Patient Education  2018 [employer].  Breast Biopsy, Care After These instructions give you information about caring for yourself after your procedure. Your doctor may also give you more specific instructions. Call your doctor if you have any problems or questions after your procedure. Follow these instructions at home: Medicines  Take over-the-counter and prescription medicines only as told by your doctor.  Do not drive for 24 hours if you received a sedative.  Do not drink alcohol while taking pain medicine.  Do not drive or use heavy machinery while taking prescription pain medicine. Biopsy Site Care   Follow instructions from your doctor about how to take care of your cut from surgery (incision) or puncture area. Make sure you: ? Wash your hands with soap and water before you change your bandage. If you cannot  use soap and water, use hand sanitizer. ? Change any bandages (dressings) as told by your doctor. ? Leave any stitches (sutures), skin glue, or skin tape (adhesive) strips in place. They may need to stay in place for 2 weeks or longer. If tape strips get loose and curl up, you may trim the loose edges. Do not remove tape strips completely unless your doctor says it is okay.  If you have stitches, keep them dry when you take a bath or a shower.  Check your cut or puncture area every day for signs of infection. Check for: ? More redness, swelling, or pain. ? More fluid or blood. ? Warmth. ? Pus or a bad smell.  Protect the biopsy area. Do not let the area get bumped. Activity  Avoid activities that could pull the biopsy site open. ?  Avoid stretching. ? Avoid reaching. ? Avoid exercise. ? Avoid sports. ? Avoid lifting anything that is heavier than 3 pounds (1.4 kg).  Return to your normal activities as told by your doctor. Ask your doctor what activities are safe for you. General instructions  Continue your normal diet.  Wear a good support bra for as long as told by your doctor.  Get checked for extra fluid in your body (lymphedema) as often as told by your doctor.  Keep all follow-up visits as told by your doctor. This is important. Contact a health care provider if:  You have more redness, swelling, or pain at the biopsy site.  You have more fluid or blood coming from your biopsy site.  Your biopsy site feels warm to the touch.  You have pus or a bad smell coming from the biopsy site.  Your biopsy site breaks open after the stitches, staples, or skin tape strips have been removed.  You have a rash.  You have a fever. Get help right away if:  You have more bleeding (more than a small spot) from the biopsy site.  You have trouble breathing.  You have red streaks around the biopsy site. This information is not intended to replace advice given to you by your health  care provider. Make sure you discuss any questions you have with your health care provider. Document Released: 09/08/2009 Document Revised: 07/19/2016 Document Reviewed: 08/16/2015 Elsevier Interactive Patient Education  2018 [place] Anesthesia is a term that refers to techniques, procedures, and medicines that help a person stay safe and comfortable during a medical procedure. Monitored anesthesia care, or sedation, is one type of anesthesia. Your anesthesia specialist may recommend sedation if you will be having a procedure that does not require you to be unconscious, such as:  Cataract surgery.  A dental procedure.  A biopsy.  A colonoscopy.  During the procedure, you may receive a medicine to help you relax (sedative). There are three levels of sedation:  Mild sedation. At this level, you may feel awake and relaxed. You will be able to follow directions.  Moderate sedation. At this level, you will be sleepy. You may not remember the procedure.  Deep sedation. At this level, you will be asleep. You will not remember the procedure.  The more medicine you are given, the deeper your level of sedation will be. Depending on how you respond to the procedure, the anesthesia specialist may change your level of sedation or the type of anesthesia to fit your needs. An anesthesia specialist will monitor you closely during the procedure. Let your health care provider know about:  Any allergies you have.  All medicines you are taking, including vitamins, herbs, eye drops, creams, and over-the-counter medicines.  Any use of steroids (by mouth or as a cream).  Any problems you or family members have had with sedatives and anesthetic medicines.  Any blood disorders you have.  Any surgeries you have had.  Any medical conditions you have, such as sleep apnea.  Whether you are pregnant or may be pregnant.  Any use of cigarettes, alcohol, or street  drugs. What are the risks? Generally, this is a safe procedure. However, problems may occur, including:  Getting too much medicine (oversedation).  Nausea.  Allergic reaction to medicines.  Trouble breathing. If this happens, a breathing tube may be used to help with breathing. It will be removed when you are awake and breathing on your own.  Heart  trouble.  Lung trouble.  Before the procedure Staying hydrated Follow instructions from your health care provider about hydration, which may include:  Up to 2 hours before the procedure - you may continue to drink clear liquids, such as water, clear fruit juice, black coffee, and plain tea.  Eating and drinking restrictions Follow instructions from your health care provider about eating and drinking, which may include:  8 hours before the procedure - stop eating heavy meals or foods such as meat, fried foods, or fatty foods.  6 hours before the procedure - stop eating light meals or foods, such as toast or cereal.  6 hours before the procedure - stop drinking milk or drinks that contain milk.  2 hours before the procedure - stop drinking clear liquids.  Medicines Ask your health care provider about:  Changing or stopping your regular medicines. This is especially important if you are taking diabetes medicines or blood thinners.  Taking medicines such as aspirin and ibuprofen. These medicines can thin your blood. Do not take these medicines before your procedure if your health care provider instructs you not to.  Tests and exams  You will have a physical exam.  You may have blood tests done to show: ? How well your kidneys and liver are working. ? How well your blood can clot.  General instructions  Plan to have someone take you home from the hospital or clinic.  If you will be going home right after the procedure, plan to have someone with you for 24 hours.  What happens during the procedure?  Your blood pressure,  heart rate, breathing, level of pain and overall condition will be monitored.  An IV tube will be inserted into one of your veins.  Your anesthesia specialist will give you medicines as needed to keep you comfortable during the procedure. This may mean changing the level of sedation.  The procedure will be performed. After the procedure  Your blood pressure, heart rate, breathing rate, and blood oxygen level will be monitored until the medicines you were given have worn off.  Do not drive for 24 hours if you received a sedative.  You may: ? Feel sleepy, clumsy, or nauseous. ? Feel forgetful about what happened after the procedure. ? Have a sore throat if you had a breathing tube during the procedure. ? Vomit. This information is not intended to replace advice given to you by your health care provider. Make sure you discuss any questions you have with your health care provider. Document Released: 08/08/2005 Document Revised: 04/20/2016 Document Reviewed: 03/04/2016 Elsevier Interactive Patient Education  2018 [place], Care After These instructions provide you with information about caring for yourself after your procedure. Your health care provider may also give you more specific instructions. Your treatment has been planned according to current medical practices, but problems sometimes occur. Call your health care provider if you have any problems or questions after your procedure. What can I expect after the procedure? After your procedure, it is common to:  Feel sleepy for several hours.  Feel clumsy and have poor balance for several hours.  Feel forgetful about what happened after the procedure.  Have poor judgment for several hours.  Feel nauseous or vomit.  Have a sore throat if you had a breathing tube during the procedure.  Follow these instructions at home: For at least 24 hours after the procedure:   Do not: ? Participate in  activities in which you could fall  or become injured. ? Drive. ? Use heavy machinery. ? Drink alcohol. ? Take sleeping pills or medicines that cause drowsiness. ? Make important decisions or sign legal documents. ? Take care of children on your own.  Rest. Eating and drinking  Follow the diet that is recommended by your health care provider.  If you vomit, drink water, juice, or soup when you can drink without vomiting.  Make sure you have little or no nausea before eating solid foods. General instructions  Have a responsible adult stay with you until you are awake and alert.  Take over-the-counter and prescription medicines only as told by your health care provider.  If you smoke, do not smoke without supervision.  Keep all follow-up visits as told by your health care provider. This is important. Contact a health care provider if:  You keep feeling nauseous or you keep vomiting.  You feel light-headed.  You develop a rash.  You have a fever. Get help right away if:  You have trouble breathing. This information is not intended to replace advice given to you by your health care provider. Make sure you discuss any questions you have with your health care provider. Document Released: 03/04/2016 Document Revised: 07/04/2016 Document Reviewed: 03/04/2016 Elsevier Interactive Patient Education  [employer].

## 2018-03-12 ENCOUNTER — Other Ambulatory Visit: Payer: Self-pay

## 2018-03-12 ENCOUNTER — Encounter (HOSPITAL_COMMUNITY)
Admission: RE | Admit: 2018-03-12 | Discharge: 2018-03-12 | Disposition: A | Discharge status: Home / Self Care | Payer: BLUE CROSS/BLUE SHIELD | Source: Ambulatory Visit | Attending: General Surgery | Admitting: General Surgery

## 2018-03-12 ENCOUNTER — Encounter (HOSPITAL_COMMUNITY): Payer: Self-pay

## 2018-03-12 DIAGNOSIS — Z01812 Encounter for preprocedural laboratory examination: Secondary | ICD-10-CM | POA: Diagnosis present

## 2018-03-12 LAB — CBC WITH DIFFERENTIAL/PLATELET
Basophils Absolute: 0 10*3/uL (ref 0.0–0.1)
Basophils Relative: 0 %
Eosinophils Absolute: 0.1 10*3/uL (ref 0.0–0.7)
Eosinophils Relative: 1 %
HCT: 38.2 % (ref 36.0–46.0)
Hemoglobin: 12.1 g/dL (ref 12.0–15.0)
Lymphocytes Relative: 21 %
Lymphs Abs: 1.7 10*3/uL (ref 0.7–4.0)
MCH: 29 pg (ref 26.0–34.0)
MCHC: 31.7 g/dL (ref 30.0–36.0)
MCV: 91.6 fL (ref 78.0–100.0)
Monocytes Absolute: 0.6 10*3/uL (ref 0.1–1.0)
Monocytes Relative: 7 %
Neutro Abs: 5.7 10*3/uL (ref 1.7–7.7)
Neutrophils Relative %: 71 %
Platelets: 267 10*3/uL (ref 150–400)
RBC: 4.17 MIL/uL (ref 3.87–5.11)
RDW: 13.3 % (ref 11.5–15.5)
WBC: 8.1 10*3/uL (ref 4.0–10.5)

## 2018-03-12 LAB — BASIC METABOLIC PANEL WITH GFR
Anion gap: 11 (ref 5–15)
BUN: 12 mg/dL (ref 6–20)
CO2: 26 mmol/L (ref 22–32)
Calcium: 9.8 mg/dL (ref 8.9–10.3)
Chloride: 105 mmol/L (ref 101–111)
Creatinine, Ser: 0.65 mg/dL (ref 0.44–1.00)
GFR calc Af Amer: >60 mL/min
GFR calc non Af Amer: >60 mL/min
Glucose, Bld: 91 mg/dL (ref 65–99)
Potassium: 3.3 mmol/L — ABNORMAL LOW (ref 3.5–5.1)
Sodium: 142 mmol/L (ref 135–145)

## 2018-03-18 ENCOUNTER — Ambulatory Visit (HOSPITAL_COMMUNITY): Payer: BLUE CROSS/BLUE SHIELD | Admitting: Anesthesiology

## 2018-03-18 ENCOUNTER — Ambulatory Visit (HOSPITAL_COMMUNITY)
Admission: RE | Admit: 2018-03-18 | Discharge: 2018-03-18 | Disposition: A | Discharge status: Home / Self Care | Payer: BLUE CROSS/BLUE SHIELD | Source: Ambulatory Visit | Attending: General Surgery | Admitting: General Surgery

## 2018-03-18 ENCOUNTER — Ambulatory Visit (HOSPITAL_COMMUNITY): Payer: BLUE CROSS/BLUE SHIELD

## 2018-03-18 ENCOUNTER — Encounter (HOSPITAL_COMMUNITY)
Admission: RE | Disposition: A | Discharge status: Home / Self Care | Payer: Self-pay | Source: Ambulatory Visit | Attending: General Surgery

## 2018-03-18 ENCOUNTER — Encounter (HOSPITAL_COMMUNITY): Payer: Self-pay

## 2018-03-18 DIAGNOSIS — M457 Ankylosing spondylitis of lumbosacral region: Secondary | ICD-10-CM

## 2018-03-18 DIAGNOSIS — E785 Hyperlipidemia, unspecified: Secondary | ICD-10-CM | POA: Insufficient documentation

## 2018-03-18 DIAGNOSIS — K219 Gastro-esophageal reflux disease without esophagitis: Secondary | ICD-10-CM | POA: Diagnosis not present

## 2018-03-18 DIAGNOSIS — I10 Essential (primary) hypertension: Secondary | ICD-10-CM | POA: Insufficient documentation

## 2018-03-18 DIAGNOSIS — D241 Benign neoplasm of right breast: Secondary | ICD-10-CM

## 2018-03-18 DIAGNOSIS — Z79899 Other long term (current) drug therapy: Secondary | ICD-10-CM | POA: Insufficient documentation

## 2018-03-18 DIAGNOSIS — Z853 Personal history of malignant neoplasm of breast: Secondary | ICD-10-CM

## 2018-03-18 DIAGNOSIS — G620 Drug-induced polyneuropathy: Secondary | ICD-10-CM | POA: Insufficient documentation

## 2018-03-18 DIAGNOSIS — T451X5A Adverse effect of antineoplastic and immunosuppressive drugs, initial encounter: Secondary | ICD-10-CM | POA: Insufficient documentation

## 2018-03-18 HISTORY — PX: BREAST BIOPSY: SHX20

## 2018-03-18 SURGERY — BREAST BIOPSY
Anesthesia: Monitor Anesthesia Care | Laterality: Right

## 2018-03-18 MED ORDER — PROPOFOL 10 MG/ML IV BOLUS
INTRAVENOUS | Status: DC | PRN
  Administered 2018-03-18: 20 mg via INTRAVENOUS

## 2018-03-18 MED ORDER — SODIUM CHLORIDE 0.9 % IR SOLN
Status: DC | PRN
Start: 2018-03-18 — End: 2018-03-18
  Administered 2018-03-18: 1000 mL

## 2018-03-18 MED ORDER — MIDAZOLAM HCL 5 MG/5ML IJ SOLN
INTRAMUSCULAR | Status: DC | PRN
  Administered 2018-03-18: 2 mg via INTRAVENOUS

## 2018-03-18 MED ORDER — KETOROLAC TROMETHAMINE 30 MG/ML IJ SOLN
30.0000 mg | Freq: Once | INTRAMUSCULAR | Status: AC
  Administered 2018-03-18: 30 mg via INTRAVENOUS

## 2018-03-18 MED ORDER — CHLORHEXIDINE GLUCONATE CLOTH 2 % EX PADS: 6.0000 | MEDICATED_PAD | Freq: Once | CUTANEOUS | Status: DC

## 2018-03-18 MED ORDER — KETOROLAC TROMETHAMINE 30 MG/ML IJ SOLN: 30.0000 mg | Freq: Once | INTRAMUSCULAR | Status: DC | PRN

## 2018-03-18 MED ORDER — FENTANYL CITRATE (PF) 100 MCG/2ML IJ SOLN
INTRAMUSCULAR | Status: DC | PRN
  Administered 2018-03-18: 25 ug via INTRAVENOUS

## 2018-03-18 MED ORDER — KETOROLAC TROMETHAMINE 30 MG/ML IJ SOLN
INTRAMUSCULAR | Status: AC
  Filled 2018-03-18: qty 1

## 2018-03-18 MED ORDER — BUPIVACAINE HCL (PF) 0.5 % IJ SOLN
INTRAMUSCULAR | Status: AC
  Filled 2018-03-18: qty 30

## 2018-03-18 MED ORDER — HYDROCODONE-ACETAMINOPHEN 7.5-325 MG PO TABS: 1.0000 | ORAL_TABLET | Freq: Once | ORAL | Status: DC | PRN

## 2018-03-18 MED ORDER — DEXAMETHASONE SODIUM PHOSPHATE 10 MG/ML IJ SOLN
INTRAMUSCULAR | Status: DC | PRN
  Administered 2018-03-18: 4 mg via INTRAVENOUS

## 2018-03-18 MED ORDER — ONDANSETRON HCL 4 MG/2ML IJ SOLN: 4.0000 mg | Freq: Once | INTRAMUSCULAR | Status: DC | PRN

## 2018-03-18 MED ORDER — LIDOCAINE HCL (PF) 1 % IJ SOLN
INTRAMUSCULAR | Status: DC | PRN
  Administered 2018-03-18: 6 mL

## 2018-03-18 MED ORDER — PROPOFOL 500 MG/50ML IV EMUL
INTRAVENOUS | Status: DC | PRN
  Administered 2018-03-18: 100 ug/kg/min via INTRAVENOUS

## 2018-03-18 MED ORDER — HYDROMORPHONE HCL 1 MG/ML IJ SOLN: 0.2500 mg | INTRAMUSCULAR | Status: DC | PRN

## 2018-03-18 MED ORDER — ONDANSETRON HCL 4 MG/2ML IJ SOLN
INTRAMUSCULAR | Status: DC | PRN
  Administered 2018-03-18: 4 mg via INTRAVENOUS

## 2018-03-18 MED ORDER — LIDOCAINE HCL (PF) 1 % IJ SOLN
INTRAMUSCULAR | Status: AC
  Filled 2018-03-18: qty 30

## 2018-03-18 MED ORDER — MIDAZOLAM HCL 2 MG/2ML IJ SOLN
INTRAMUSCULAR | Status: AC
  Filled 2018-03-18: qty 2

## 2018-03-18 MED ORDER — FENTANYL CITRATE (PF) 100 MCG/2ML IJ SOLN
INTRAMUSCULAR | Status: AC
  Filled 2018-03-18: qty 2

## 2018-03-18 MED ORDER — LACTATED RINGERS IV SOLN
INTRAVENOUS | Status: DC | PRN
  Administered 2018-03-18: 08:00:00 via INTRAVENOUS

## 2018-03-18 MED ORDER — HYDROCODONE-ACETAMINOPHEN 5-325 MG PO TABS: 1.0000 | ORAL_TABLET | Freq: Four times a day (QID) | ORAL | 0 refills | Status: DC | PRN

## 2018-03-18 MED ORDER — PROPOFOL 10 MG/ML IV BOLUS
INTRAVENOUS | Status: AC
  Filled 2018-03-18: qty 20

## 2018-03-18 MED ORDER — MEPERIDINE HCL 50 MG/ML IJ SOLN: 6.2500 mg | INTRAMUSCULAR | Status: DC | PRN

## 2018-03-18 SURGICAL SUPPLY — 30 items
ADH SKN CLS APL DERMABOND .7 (GAUZE/BANDAGES/DRESSINGS) ×1
BAG HAMPER (MISCELLANEOUS) ×2 IMPLANT
BLADE SURG 15 STRL LF DISP TIS (BLADE) ×1 IMPLANT
BLADE SURG 15 STRL SS (BLADE) ×2
CHLORAPREP W/TINT 26ML (MISCELLANEOUS) ×2 IMPLANT
CLOTH BEACON ORANGE TIMEOUT ST (SAFETY) ×2 IMPLANT
COVER LIGHT HANDLE STERIS (MISCELLANEOUS) ×4 IMPLANT
DECANTER SPIKE VIAL GLASS SM (MISCELLANEOUS) ×3 IMPLANT
DERMABOND ADVANCED (GAUZE/BANDAGES/DRESSINGS) ×1
DERMABOND ADVANCED .7 DNX12 (GAUZE/BANDAGES/DRESSINGS) ×1 IMPLANT
DEVICE DUBIN SPECIMEN MAMMOGRA (MISCELLANEOUS) ×1 IMPLANT
ELECT REM PT RETURN 9FT ADLT (ELECTROSURGICAL) ×2
ELECTRODE REM PT RTRN 9FT ADLT (ELECTROSURGICAL) ×1 IMPLANT
GLOVE BIOGEL M 7.0 STRL (GLOVE) ×1 IMPLANT
GLOVE BIOGEL PI IND STRL 7.0 (GLOVE) ×1 IMPLANT
GLOVE BIOGEL PI INDICATOR 7.0 (GLOVE) ×2
GLOVE SURG SS PI 7.5 STRL IVOR (GLOVE) ×3 IMPLANT
GOWN STRL REUS W/TWL LRG LVL3 (GOWN DISPOSABLE) ×4 IMPLANT
KIT TURNOVER KIT A (KITS) ×2 IMPLANT
MANIFOLD NEPTUNE II (INSTRUMENTS) ×2 IMPLANT
NDL HYPO 25X1 1.5 SAFETY (NEEDLE) ×1 IMPLANT
NEEDLE HYPO 25X1 1.5 SAFETY (NEEDLE) ×2 IMPLANT
NS IRRIG 1000ML POUR BTL (IV SOLUTION) ×2 IMPLANT
PACK MINOR (CUSTOM PROCEDURE TRAY) ×2 IMPLANT
PAD ARMBOARD 7.5X6 YLW CONV (MISCELLANEOUS) ×2 IMPLANT
SET BASIN LINEN APH (SET/KITS/TRAYS/PACK) ×2 IMPLANT
SUT MNCRL AB 4-0 PS2 18 (SUTURE) ×2 IMPLANT
SUT VIC AB 3-0 SH 27 (SUTURE) ×2
SUT VIC AB 3-0 SH 27X BRD (SUTURE) ×1 IMPLANT
SYR CONTROL 10ML LL (SYRINGE) ×4 IMPLANT

## 2018-03-18 NOTE — Anesthesia Procedure Notes (Signed)
Procedure Name: MAC Date/Time: 03/18/2018 8:15 AM Performed by: Vista Deck, CRNA Pre-anesthesia Checklist: Patient identified, Emergency Drugs available, Suction available, Timeout performed and Patient being monitored Patient Re-evaluated:Patient Re-evaluated prior to induction Oxygen Delivery Method: Nasal Cannula

## 2018-03-18 NOTE — Transfer of Care (Signed)
Immediate Anesthesia Transfer of Care Note  Patient: Kimberly Roman  Procedure(s) Performed: RIGHT BREAST BIOPSY (Right )  Patient Location: PACU  Anesthesia Type:MAC  Level of Consciousness: awake and patient cooperative  Airway & Oxygen Therapy: Patient Spontanous Breathing and Patient connected to nasal cannula oxygen  Post-op Assessment: Report given to RN and Post -op Vital signs reviewed and stable  Post vital signs: Reviewed and stable  Last Vitals:  Vitals Value Taken Time  BP    Temp    Pulse 69 03/18/2018  8:55 AM  Resp 18 03/18/2018  8:55 AM  SpO2 95 % 03/18/2018  8:55 AM  Vitals shown include unvalidated device data.  Last Pain:  Vitals:   03/18/18 0726  TempSrc: Oral  PainSc: 0-No pain      Patients Stated Pain Goal: 7 (23/01/72 0910)  Complications: No apparent anesthesia complications

## 2018-03-18 NOTE — Anesthesia Preprocedure Evaluation (Signed)
Anesthesia Evaluation  Patient identified by MRN, date of birth, ID band Patient awake    Reviewed: Allergy & Precautions, H&P , NPO status , Patient's Chart, lab work & pertinent test results, reviewed documented beta blocker date and time   Airway Mallampati: II  TM Distance: >3 FB Neck ROM: full    Dental no notable dental hx.    Pulmonary neg pulmonary ROS,    Pulmonary exam normal breath sounds clear to auscultation       Cardiovascular Exercise Tolerance: Good hypertension, negative cardio ROS   Rhythm:regular Rate:Normal     Neuro/Psych  Neuromuscular disease negative neurological ROS  negative psych ROS   GI/Hepatic negative GI ROS, Neg liver ROS, GERD  ,  Endo/Other  negative endocrine ROS  Renal/GU negative Renal ROS  negative genitourinary   Musculoskeletal   Abdominal   Peds  Hematology negative hematology ROS (+)   Anesthesia Other Findings   Reproductive/Obstetrics negative OB ROS                             Anesthesia Physical Anesthesia Plan  ASA: III  Anesthesia Plan: MAC   Post-op Pain Management:    Induction:   PONV Risk Score and Plan:   Airway Management Planned:   Additional Equipment:   Intra-op Plan:   Post-operative Plan:   Informed Consent: I have reviewed the patients History and Physical, chart, labs and discussed the procedure including the risks, benefits and alternatives for the proposed anesthesia with the patient or authorized representative who has indicated his/her understanding and acceptance.   Dental Advisory Given  Plan Discussed with: CRNA  Anesthesia Plan Comments:         Anesthesia Quick Evaluation

## 2018-03-18 NOTE — Discharge Instructions (Signed)
Breast Biopsy, Care After These instructions give you information about caring for yourself after your procedure. Your doctor may also give you more specific instructions. Call your doctor if you have any problems or questions after your procedure. Follow these instructions at home: Medicines  Take over-the-counter and prescription medicines only as told by your doctor.  Do not drive for 24 hours if you received a sedative.  Do not drink alcohol while taking pain medicine.  Do not drive or use heavy machinery while taking prescription pain medicine. Biopsy Site Care   Follow instructions from your doctor about how to take care of your cut from surgery (incision) or puncture area. Make sure you: ? Wash your hands with soap and water before you change your bandage. If you cannot use soap and water, use hand sanitizer. ? Change any bandages (dressings) as told by your doctor. ? Leave any stitches (sutures), skin glue, or skin tape (adhesive) strips in place. They may need to stay in place for 2 weeks or longer. If tape strips get loose and curl up, you may trim the loose edges. Do not remove tape strips completely unless your doctor says it is okay.  If you have stitches, keep them dry when you take a bath or a shower.  Check your cut or puncture area every day for signs of infection. Check for: ? More redness, swelling, or pain. ? More fluid or blood. ? Warmth. ? Pus or a bad smell.  Protect the biopsy area. Do not let the area get bumped. Activity  Avoid activities that could pull the biopsy site open. ? Avoid stretching. ? Avoid reaching. ? Avoid exercise. ? Avoid sports. ? Avoid lifting anything that is heavier than 3 pounds (1.4 kg).  Return to your normal activities as told by your doctor. Ask your doctor what activities are safe for you. General instructions  Continue your normal diet.  Wear a good support bra for as long as told by your doctor.  Get checked for extra  fluid in your body (lymphedema) as often as told by your doctor.  Keep all follow-up visits as told by your doctor. This is important. Contact a health care provider if:  You have more redness, swelling, or pain at the biopsy site.  You have more fluid or blood coming from your biopsy site.  Your biopsy site feels warm to the touch.  You have pus or a bad smell coming from the biopsy site.  Your biopsy site breaks open after the stitches, staples, or skin tape strips have been removed.  You have a rash.  You have a fever. Get help right away if:  You have more bleeding (more than a small spot) from the biopsy site.  You have trouble breathing.  You have red streaks around the biopsy site. This information is not intended to replace advice given to you by your health care provider. Make sure you discuss any questions you have with your health care provider. Document Released: 09/08/2009 Document Revised: 07/19/2016 Document Reviewed: 08/16/2015 Elsevier Interactive Patient Education  2018 [employer].

## 2018-03-18 NOTE — Anesthesia Postprocedure Evaluation (Signed)
Anesthesia Post Note  Patient: Kimberly Roman  Procedure(s) Performed: RIGHT BREAST BIOPSY (Right )  Patient location during evaluation: Short Stay Anesthesia Type: MAC Level of consciousness: awake and alert and patient cooperative Pain management: satisfactory to patient Vital Signs Assessment: post-procedure vital signs reviewed and stable Respiratory status: spontaneous breathing Cardiovascular status: stable Postop Assessment: no apparent nausea or vomiting Anesthetic complications: no     Last Vitals:  Vitals:   03/18/18 0930 03/18/18 0940  BP: 132/84 (!) 142/80  Pulse: 61 66  Resp: 12 16  Temp:  36.6 C  SpO2: 94% 95%    Last Pain:  Vitals:   03/18/18 0940  TempSrc: Oral  PainSc: 2                  Marcille Barman

## 2018-03-18 NOTE — Interval H&P Note (Signed)
History and Physical Interval Note:  03/18/2018 7:46 AM  Kimberly Roman  has presented today for surgery, with the diagnosis of Right Breast Mass, Right Breast Cancer  The various methods of treatment have been discussed with the patient and family. After consideration of risks, benefits and other options for treatment, the patient has consented to  Procedure(s): RIGHT BREAST BIOPSY (Right) as a surgical intervention .  The patient's history has been reviewed, patient examined, no change in status, stable for surgery.  I have reviewed the patient's chart and labs.  Questions were answered to the patient's satisfaction.     Aviva Signs

## 2018-03-18 NOTE — Anesthesia Procedure Notes (Signed)
Procedure Name: MAC Date/Time: 03/18/2018 8:20 AM Performed by: Vista Deck, CRNA Pre-anesthesia Checklist: Patient identified, Emergency Drugs available, Suction available, Timeout performed and Patient being monitored Patient Re-evaluated:Patient Re-evaluated prior to induction Oxygen Delivery Method: Non-rebreather mask

## 2018-03-18 NOTE — Op Note (Signed)
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  The risks and benefits of the procedure including bleeding, infection, and the possibility of malignancy were fully explained to the patient, who gave informed consent.  Procedure note: The patient was placed in supine position.  After monitored anesthesia care was given, the right breast was prepped and draped using the usual sterile technique with DuraPrep.  Surgical site confirmation was performed.  1% Xylocaine was used for local anesthesia.  Incision was made in the 2 to 3 o'clock position of the right periareolar region.  Tissue from the retroareolar region as well as the regular breast tissue was excised without difficulty.  The specimen was sent to mammography and specimen radiography revealed the clip to be within the specimen removed.  It was then sent to pathology for further examination.  A bleeding was controlled using Bovie electrocautery.  The skin was reapproximated using a 4-0 Monocryl subcuticular suture.  Dermabond was applied.  All tape and needle counts were correct at the end of the procedure.  The patient was awakened and transferred to PACU in stable condition.  Complications: None  EBL: Minimal  Specimen: Right breast tissue

## 2018-03-19 ENCOUNTER — Encounter (HOSPITAL_COMMUNITY): Payer: Self-pay | Admitting: General Surgery

## 2018-03-25 ENCOUNTER — Encounter (HOSPITAL_COMMUNITY): Payer: Self-pay | Admitting: Internal Medicine

## 2018-03-25 ENCOUNTER — Other Ambulatory Visit: Payer: Self-pay

## 2018-03-25 ENCOUNTER — Inpatient Hospital Stay (HOSPITAL_COMMUNITY): Payer: BLUE CROSS/BLUE SHIELD | Attending: Adult Health | Admitting: Internal Medicine

## 2018-03-25 ENCOUNTER — Ambulatory Visit (INDEPENDENT_AMBULATORY_CARE_PROVIDER_SITE_OTHER): Payer: Self-pay | Admitting: General Surgery

## 2018-03-25 ENCOUNTER — Encounter: Payer: Self-pay | Admitting: General Surgery

## 2018-03-25 VITALS — BP 146/90 | HR 88 | Temp 97.3°F | Ht 65.0 in | Wt 171.0 lb

## 2018-03-25 VITALS — BP 153/90 | HR 84 | Resp 18 | Wt 171.0 lb

## 2018-03-25 DIAGNOSIS — C50911 Malignant neoplasm of unspecified site of right female breast: Secondary | ICD-10-CM

## 2018-03-25 DIAGNOSIS — Z09 Encounter for follow-up examination after completed treatment for conditions other than malignant neoplasm: Secondary | ICD-10-CM

## 2018-03-25 DIAGNOSIS — Z853 Personal history of malignant neoplasm of breast: Secondary | ICD-10-CM | POA: Insufficient documentation

## 2018-03-25 NOTE — Progress Notes (Signed)
Subjective:     Kimberly Roman  Here for follow-up status post right breast biopsy.  Patient doing well.  Has no complaints. Objective:    BP (!) 146/90   Pulse 88   Temp (!) 97.3 F (36.3 C)   Ht 5\' 5"  (1.651 m)   Wt 171 lb (77.6 kg)   BMI 28.46 kg/m   General:  alert, cooperative and no distress  Right breast incision healing well. Final pathology negative for malignancy.     Assessment:    Doing well postoperatively.    Plan:   Follow-up here as needed.

## 2018-03-25 NOTE — Progress Notes (Signed)
Diagnosis Infiltrating ductal carcinoma of right breast ([place]) - Plan: MM DIAG BREAST TOMO BILATERAL, CBC with Differential/Platelet, Comprehensive metabolic panel, Lactate dehydrogenase  Staging Cancer Staging Invasive ductal carcinoma of breast ([place]) Staging form: Breast, AJCC 7th Edition - Clinical stage from 09/28/2016: Stage IA (T1c, N0, M0) - Signed by Baird Cancer, PA-C on 10/22/2016   Assessment and Plan:  1.  1.  Stage IA (T1cN0M0) invasive ductal carcinoma of right breast; ER-/PR-/HER2-.  Pt was diagnosed in 08/2016.  History per interval history.  She will be set up for bilateral diagnostic mammogram in 09/2018 and will RTC to go over the results and repeat labs.    2.  Right breast papilloma.  Pt developed new (R) nipple discharge and (R) breast nodule.  She had a small, sub-cm nodule palpable at areolar ridge at approx 10 o'clock position of right breast..  She underwent (R) diagnostic mammogram with ultrasound on 01/21/2018 that showed  IMPRESSION: 1. Single dilated duct in the 3 o'clock retroareolar right breast, containing a 9 x 5 x 3 mm intraductal mass. This most likely represents an intraductal papilloma. However, ductal carcinoma in situ is also a possibility. The dilated duct extends into a 9 mm cystic area at the junction of the areola and nipple at the 1:30 o'clock position. 2. Small amount of clear yellow discharge elicited from a single duct in the central right nipple. 3. 9 mm densely calcified fibroadenoma in the 10 o'clock retroareolar right breast.  RECOMMENDATION: Ultrasound guided core needle biopsy of the 9 mm intraductal mass in the 3 o'clock retroareolar right breast. This has been discussed with the patient and scheduled at 12:30 p.m. on 02/11/2018.  Pt had right breast core biopsy done at 3:00 position on 02/11/2018 with pathology returning as benign papilloma.    She was seen by Dr. Arnoldo Morale and underwent Right breast excision on 03/18/2018 with  pathology returning as papilloma with no malignancy seen.    She is doing well since surgery.  I have discussed with her finding is considered a benign lesion.  She will be set up for bilateral diagnostic mammogram in 09/2018 and will follow-up to go over results.    2.  HTN.  BP is 153/90.  Follow-up with PCP.    3.  PAC questions.  Pt desires to have PAC removed and she will be referred to Dr. Arnoldo Morale.    4.  Health maintenance.  Continue GI evaluation as recommended.    Interval History:  1.  Stage IA (T1cN0M0) invasive ductal carcinoma of right breast; ER-/PR-/HER2-.  Pt was diagnosed in 08/2016.  Treated with lumpectomy and adjuvant chemotherapy with Adriamycin/Cytoxan x 4 cycles. Completed 9 of 12 planned cycles of weekly Taxol; chemo course was complicated by grade 3 peripheral neuropathy and Taxol was subsequently discontinued after cycle #9.  Completed chemo on 02/21/17.  Went on to complete adjuvant breast radiation in [place] on 04/26/17. She required PRBC transfusion during her radiation therapy for significant anemia. She was not recommended for anti-estrogen therapy due to  triple negative disease.  Mammogram done 10/01/17 and negative for malignancy.   She developed new (R) nipple discharge and (R) breast nodule.  She had a small, sub-cm nodule palpable at areolar ridge at approx 10 o'clock position of right breast..  She underwent (R) diagnostic mammogram with ultrasound on 01/21/2018 that showed  IMPRESSION: 1. Single dilated duct in the 3 o'clock retroareolar right breast, containing a 9 x 5 x 3 mm intraductal mass. This  most likely represents an intraductal papilloma. However, ductal carcinoma in situ is also a possibility. The dilated duct extends into a 9 mm cystic area at the junction of the areola and nipple at the 1:30 o'clock position. 2. Small amount of clear yellow discharge elicited from a single duct in the central right nipple. 3. 9 mm densely calcified fibroadenoma in  the 10 o'clock retroareolar right breast.  RECOMMENDATION: Ultrasound guided core needle biopsy of the 9 mm intraductal mass in the 3 o'clock retroareolar right breast. This has been discussed with the patient and scheduled at 12:30 p.m. on 02/11/2018.  Pt had right breast core biopsy done at 3:00 position on 02/11/2018 with pathology returning as benign papilloma.    She was seen by Dr. Arnoldo Morale and underwent Right breast excision on 03/18/2018 with pathology returning as papilloma with no malignancy seen.     Current Status:  Pt is seen today for follow-up to go over path from recent right breast surgery.  She desires to have PAC removed.      Invasive ductal carcinoma of breast ([place])   08/13/2016 Mammogram    BI-RADS CATEGORY  0: Incomplete.      08/21/2016 Mammogram    BI-RADS CATEGORY  5: Highly suggestive of malignancy. Suspicious 1.2 cm mass 10 o'clock position right breast.      08/21/2016 Breast US    Targeted ultrasound is performed, showing a hypoechoic irregular mass with indistinct margins at 10 o'clock position 9 cm from the nipple measuring approximately 1.1 x 1.2 x 1.0 cm. No additional masses are seen in this region of the right breast on ultrasound.      08/28/2016 Procedure    Right needle core biopsy      09/26/2016 Procedure    Right upper outer quadrant lumpectomy by Dr. Arnoldo Morale      09/28/2016 Pathology Results    Invasive ductal carcinoma, grade 3, standing 1.7 cm. High-grade ductal carcinoma in situ with comedonecrosis. Invasive carcinoma in less than 0.1 cm of the superior medial margin focally. In situ carcinoma is less than 0.1 cm of superior medial margin and the posterior margin focally. No LVI. ER negative. PR negative. HER-2 negative. KI-67 90%.      09/28/2016 Cancer Staging    Invasive ductal carcinoma of breast [facility])   Staging form: Breast, AJCC 7th Edition   - Clinical stage from 09/28/2016: Stage IA (T1c, N0, M0) - Signed by Baird Cancer,  PA-C on 10/22/2016      09/29/2016 Pathology Results    Invasive ductal carcinoma, grade 2/3. HER-2 negative. ER negative. PR negative. Ki-67 90%.      10/24/2016 Echocardiogram    - Left ventricle: The cavity size was normal. Wall thickness was   normal. Systolic function was normal. The estimated ejection   fraction was in the range of 60% to 65%. Wall motion was normal;   there were no regional wall motion abnormalities.       10/26/2016 Procedure    Port placed placed by Dr. Arnoldo Morale      11/02/2016 -  Chemotherapy    The patient had DOXOrubicin (ADRIAMYCIN) chemo injection 106 mg, 60 mg/m2 = 106 mg, Intravenous,  Once, 4 of 4 cycles  palonosetron (ALOXI) injection 0.25 mg, 0.25 mg, Intravenous,  Once, 4 of 4 cycles  pegfilgrastim (NEULASTA ONPRO KIT) injection 6 mg, 6 mg, Subcutaneous, Once, 4 of 4 cycles  cyclophosphamide (CYTOXAN) 1,060 mg in sodium chloride 0.9 % 250 mL chemo infusion, 600  mg/m2 = 1,060 mg, Intravenous,  Once, 4 of 4 cycles  fosaprepitant (EMEND) 150 mg, dexamethasone (DECADRON) 12 mg in sodium chloride 0.9 % 145 mL IVPB, , Intravenous,  Once, 4 of 4 cycles  PACLitaxel (TAXOL) 138 mg in dextrose 5 % 250 mL chemo infusion ( ondansetron (ZOFRAN) 8 mg in sodium chloride 0.9 % 50 mL IVPB, , Intravenous,  Once, 4 of 12 cycles  for chemotherapy treatment.        03/28/2017 - 04/26/2017 Radiation Therapy    Adjuvant breast radiation [facility]). Right breast 3D CRT 6x photons 16 fractions at 2.65 GY/f to 42.4 Gy TB boost 3 field 6X photons 4 fractions at 2.5 Gy/f to 10 GY             Problem List Patient Active Problem List   Diagnosis Date Noted  . Chemotherapy-induced peripheral neuropathy ([place]) [G62.0, T45.1X5A] 03/18/2018  . Papilloma of right breast [D24.1]   . Hx of breast cancer [Z85.3]   . Ankylosing spondylitis ([place]) [M45.9] 11/17/2016  . GERD (gastroesophageal reflux disease) [K21.9] 11/17/2016  . Invasive ductal carcinoma of breast ([place])  [C50.919] 10/22/2016    Past Medical History Past Medical History:  Diagnosis Date  . Ankylosing spondylitis ([place])   . Arthritis   . Breast cancer ([place])    right  . Carpal tunnel syndrome   . Environmental allergies   . GERD (gastroesophageal reflux disease)   . Hyperlipemia   . Hypertension   . Invasive ductal carcinoma of breast ([place]) 10/22/2016    Past Surgical History Past Surgical History:  Procedure Laterality Date  . ABDOMINAL HYSTERECTOMY    . BREAST BIOPSY Right 03/18/2018   Procedure: RIGHT BREAST BIOPSY;  Surgeon: Aviva Signs, MD;  Location: [facility];  Service: General;  Laterality: Right;  . CARPAL TUNNEL RELEASE Right 04/27/2013   Procedure: RIGHT CARPAL TUNNEL RELEASE;  Surgeon: Carole Civil, MD;  Location: [facility] OR[facility];  Service: Orthopedics;  Laterality: Right;  . COLONOSCOPY N/A 01/20/2013   Procedure: COLONOSCOPY;  Surgeon: Danie Binder, MD;  Location: [facility];  Service: Endoscopy;  Laterality: N/A;  9:30 AM  . HEMATOMA EVACUATION Right 10/26/2016   Procedure: EVACUATION HEMATOMA;  Surgeon: Aviva Signs, MD;  Location: [facility];  Service: General;  Laterality: Right;  . PARTIAL MASTECTOMY WITH AXILLARY SENTINEL LYMPH NODE BIOPSY Right 09/26/2016   Procedure: PARTIAL MASTECTOMY WITH AXILLARY SENTINEL LYMPH NODE BIOPSY;  Surgeon: Aviva Signs, MD;  Location: [facility];  Service: General;  Laterality: Right;  . PORTACATH PLACEMENT Left 10/26/2016   Procedure: INSERTION PORT-A-CATH;  Surgeon: Aviva Signs, MD;  Location: [facility];  Service: General;  Laterality: Left;    Family History Family History  Problem Relation Age of Onset  . Arthritis Unknown   . Hypertension Mother   . Hypertension Father   . Colon cancer Neg Hx      Social History  reports that she has never smoked. She has never used smokeless tobacco. She reports that she does not drink alcohol or use drugs.  Medications  Current Outpatient Medications:  .  acetaminophen (TYLENOL) 650 MG CR  tablet, Take 650 mg by mouth every 8 (eight) hours as needed for pain., Disp: , Rfl:  .  amLODipine (NORVASC) 10 MG tablet, Take 10 mg by mouth every evening. , Disp: , Rfl:  .  Artificial Tear Solution (SOOTHE XP) SOLN, Apply 2 drops to eye 3 (three) times daily as needed (for dry/irritated eyes). , Disp: , Rfl:  .  HYDROcodone-acetaminophen (NORCO) 5-325 MG tablet, Take 1 tablet by mouth every 6 (six) hours as needed for moderate pain., Disp: 25 tablet, Rfl: 0 .  loratadine (CLARITIN) 10 MG tablet, Take 10 mg by mouth daily as needed for allergies., Disp: , Rfl:  .  omeprazole (PRILOSEC) 20 MG capsule, Take 20 mg by mouth daily before breakfast. , Disp: , Rfl: 0 .  sodium chloride (OCEAN) 0.65 % SOLN nasal spray, Place 1 spray into both nostrils 4 (four) times daily as needed (for dry/congestion/allergies.)., Disp: , Rfl:   Allergies Atorvastatin and Lipitor [atorvastatin calcium]  Review of Systems Review of Systems - Oncology ROS as per HPI otherwise 12 point ROS is negative.   Physical Exam  Vitals Wt Readings from Last 3 Encounters:  03/25/18 171 lb (77.6 kg)  03/25/18 171 lb (77.6 kg)  03/12/18 170 lb (77.1 kg)   Temp Readings from Last 3 Encounters:  03/25/18 (!) 97.3 F (36.3 C)  03/18/18 97.8 F (36.6 C) (Oral)  03/12/18 98.3 F (36.8 C) (Oral)   BP Readings from Last 3 Encounters:  03/25/18 (!) 153/90  03/25/18 (!) 146/90  03/18/18 (!) 142/80   Pulse Readings from Last 3 Encounters:  03/25/18 84  03/25/18 88  03/18/18 66    Constitutional: Well-developed, well-nourished, and in no distress.   HENT: Head: Normocephalic and atraumatic.  Mouth/Throat: No oropharyngeal exudate. Mucosa moist. Eyes: Pupils are equal, round, and reactive to light. Conjunctivae are normal. No scleral icterus.  Neck: Normal range of motion. Neck supple. No JVD present.  Cardiovascular: Normal rate, regular rhythm and normal heart sounds.  Exam reveals no gallop and no friction rub.    No murmur heard. Pulmonary/Chest: Effort normal and breath sounds normal. No respiratory distress. No wheezes.No rales.  Abdominal: Soft. Bowel sounds are normal. No distension. There is no tenderness. There is no guarding.  Musculoskeletal: No edema or tenderness.  Lymphadenopathy: No cervical, axillary  or supraclavicular adenopathy.  Neurological: Alert and oriented to person, place, and time. No cranial nerve deficit.  Skin: Skin is warm and dry. No rash noted. No erythema. No pallor.  Psychiatric: Affect and judgment normal.  Bilateral breast exam.  Chaperone present.  Right breast incision site near areola healing well.  No dominant masses noted bilaterally.    Labs No visits with results within 3 Day(s) from this visit.  Latest known visit with results is:  Hospital Outpatient Visit on 03/12/2018  Component Date Value Ref Range Status  . WBC 03/12/2018 8.1  4.0 - 10.5 K/uL Final  . RBC 03/12/2018 4.17  3.87 - 5.11 MIL/uL Final  . Hemoglobin 03/12/2018 12.1  12.0 - 15.0 g/dL Final  . HCT 03/12/2018 38.2  36.0 - 46.0 % Final  . MCV 03/12/2018 91.6  78.0 - 100.0 fL Final  . MCH 03/12/2018 29.0  26.0 - 34.0 pg Final  . MCHC 03/12/2018 31.7  30.0 - 36.0 g/dL Final  . RDW 03/12/2018 13.3  11.5 - 15.5 % Final  . Platelets 03/12/2018 267  150 - 400 K/uL Final  . Neutrophils Relative % 03/12/2018 71  % Final  . Neutro Abs 03/12/2018 5.7  1.7 - 7.7 K/uL Final  . Lymphocytes Relative 03/12/2018 21  % Final  . Lymphs Abs 03/12/2018 1.7  0.7 - 4.0 K/uL Final  . Monocytes Relative 03/12/2018 7  % Final  . Monocytes Absolute 03/12/2018 0.6  0.1 - 1.0 K/uL Final  . Eosinophils Relative 03/12/2018 1  % Final  . Eosinophils  Absolute 03/12/2018 0.1  0.0 - 0.7 K/uL Final  . Basophils Relative 03/12/2018 0  % Final  . Basophils Absolute 03/12/2018 0.0  0.0 - 0.1 K/uL Final   Performed at [facility], [address], [place], [place] [address]  . Sodium 03/12/2018 142  135 - 145 mmol/L Final   . Potassium 03/12/2018 3.3* 3.5 - 5.1 mmol/L Final  . Chloride 03/12/2018 105  101 - 111 mmol/L Final  . CO2 03/12/2018 26  22 - 32 mmol/L Final  . Glucose, Bld 03/12/2018 91  65 - 99 mg/dL Final  . BUN 03/12/2018 12  6 - 20 mg/dL Final  . Creatinine, Ser 03/12/2018 0.65  0.44 - 1.00 mg/dL Final  . Calcium 03/12/2018 9.8  8.9 - 10.3 mg/dL Final  . GFR calc non Af Amer 03/12/2018 >60  >60 mL/min Final  . GFR calc Af Amer 03/12/2018 >60  >60 mL/min Final   Comment: (NOTE) The eGFR has been calculated using the CKD EPI equation. This calculation has not been validated in all clinical situations. eGFR's persistently <60 mL/min signify possible Chronic Kidney Disease.   Georgiann Hahn gap 03/12/2018 11  5 - 15 Final   Performed at [facility], [address] [place], [place] [address]     Pathology Orders Placed This Encounter  Procedures  . MM DIAG BREAST TOMO BILATERAL    Standing Status:   Future    Standing Expiration Date:   03/26/2019    Order Specific Question:   Reason for Exam (SYMPTOM  OR DIAGNOSIS REQUIRED)    Answer:   right breast cancer    Order Specific Question:   Preferred imaging location?    Answer:   [facility]  . CBC with Differential/Platelet    Standing Status:   Future    Standing Expiration Date:   03/26/2019  . Comprehensive metabolic panel    Standing Status:   Future    Standing Expiration Date:   03/26/2019  . Lactate dehydrogenase    Standing Status:   Future    Standing Expiration Date:   03/26/2019       Zoila Shutter MD

## 2018-03-27 NOTE — H&P (Signed)
Kimberly Roman; [id number]; 04/07/1956   HPI Patient is a 62 year old black female who was referred to my care by oncology and Dr. Legrand Rams for Port-A-Cath removal.  She has finished with chemotherapy and is ready to have her port removed. Past Medical History:  Diagnosis Date  . Ankylosing spondylitis ([place])   . Arthritis   . Breast cancer ([place])   . Carpal tunnel syndrome   . Environmental allergies   . GERD (gastroesophageal reflux disease)   . Hyperlipemia   . Hypertension   . Invasive ductal carcinoma of breast ([place]) 10/22/2016    Past Surgical History:  Procedure Laterality Date  . ABDOMINAL HYSTERECTOMY    . CARPAL TUNNEL RELEASE Right 04/27/2013   Procedure: RIGHT CARPAL TUNNEL RELEASE;  Surgeon: Carole Civil, MD;  Location: [facility];  Service: Orthopedics;  Laterality: Right;  . COLONOSCOPY N/A 01/20/2013   Procedure: COLONOSCOPY;  Surgeon: Danie Binder, MD;  Location[facility];  Service: Endoscopy;  Laterality: N/A;  9:30 AM  . HEMATOMA EVACUATION Right 10/26/2016   Procedure: EVACUATION HEMATOMA;  Surgeon: Aviva Signs, MD;  Location: [facility];  Service: General;  Laterality: Right;  . PARTIAL MASTECTOMY WITH AXILLARY SENTINEL LYMPH NODE BIOPSY Right 09/26/2016   Procedure: PARTIAL MASTECTOMY WITH AXILLARY SENTINEL LYMPH NODE BIOPSY;  Surgeon: Aviva Signs, MD;  Location: [facility];  Service: General;  Laterality: Right;  . PORTACATH PLACEMENT Left 10/26/2016   Procedure: INSERTION PORT-A-CATH;  Surgeon: Aviva Signs, MD;  Location: [facility];  Service: General;  Laterality: Left;    Family History  Problem Relation Age of Onset  . Arthritis Unknown   . Hypertension Mother   . Hypertension Father   . Colon cancer Neg Hx     Current Outpatient Medications on File Prior to Visit  Medication Sig Dispense Refill  . acetaminophen (TYLENOL) 650 MG CR tablet Take 650 mg by mouth every 8 (eight) hours as needed for pain.    Marland Kitchen amLODipine (NORVASC) 10 MG tablet Take 10 mg by  mouth daily.     . Artificial Tear Solution (SOOTHE XP) SOLN Apply 2 drops to eye 3 (three) times daily as needed (for dry/irritated eyes).     Marland Kitchen omeprazole (PRILOSEC) 20 MG capsule Take 20 mg by mouth every morning.   0  . sodium chloride (OCEAN) 0.65 % SOLN nasal spray Place 1 spray into both nostrils 4 (four) times daily as needed (for dry/congestion/allergies.).    . [DISCONTINUED] prochlorperazine (COMPAZINE) 10 MG tablet Take 1 tablet (10 mg total) by mouth every 6 (six) hours as needed (Nausea or vomiting). 30 tablet 1   No current facility-administered medications on file prior to visit.     Allergies  Allergen Reactions  . Atorvastatin     Other reaction(s): Other (See Comments) REACTION: Cramping in legs/lower extremties  . Lipitor [Atorvastatin Calcium] Other (See Comments)    REACTION: Cramping in legs/lower extremties    Social History   Substance and Sexual Activity  Alcohol Use No    Social History   Tobacco Use  Smoking Status Never Smoker  Smokeless Tobacco Never Used    Review of Systems  Constitutional: Negative.   HENT: Negative.   Eyes: Negative.   Respiratory: Negative.   Cardiovascular: Negative.   Gastrointestinal: Negative.   Genitourinary: Negative.   Musculoskeletal: Negative.   Skin: Negative.   Neurological: Negative.   Endo/Heme/Allergies: Negative.   Psychiatric/Behavioral: Negative.     Objective   Vitals:   02/27/18 1111  BP: 132/88  Pulse: 83  Temp: 97.7 F (36.5 C)    Physical Exam  Constitutional: She is oriented to person, place, and time and well-developed, well-nourished, and in no distress.  HENT:  Head: Normocephalic and atraumatic.  Cardiovascular: Normal rate, regular rhythm and normal heart sounds. Exam reveals no gallop and no friction rub.  No murmur heard. Pulmonary/Chest: Effort normal and breath sounds normal. No respiratory distress. She has no wheezes. She has no rales.  Port-A-Cath in place left upper  chest  Neurological: She is alert and oriented to person, place, and time.  Walks with a cane  Skin: Skin is warm and dry.  Vitals reviewed. Breast: Dominant ovoid firmness at the 2 to 3 o'clock position in the periareolar region.  No nipple discharge identified.  Is tender to touch.  Well-healed surgical scar laterally.  Axilla negative for palpable nodes.  Left breast unremarkable.  Mammography and pathology reports reviewed.  Assessment   Newly diagnosed sclerosing papilloma of right breast Right breast carcinoma Plan   Patient is scheduled for Port-A-Cath removal on 04/02/2018.  The risks and benefits of the procedure including bleeding and infection were fully explained to the patient, who gave informed consent.  This will be performed in the minor procedure room.

## 2018-04-02 ENCOUNTER — Encounter (HOSPITAL_COMMUNITY): Payer: Self-pay | Admitting: Emergency Medicine

## 2018-04-02 ENCOUNTER — Other Ambulatory Visit: Payer: Self-pay

## 2018-04-02 ENCOUNTER — Ambulatory Visit (HOSPITAL_COMMUNITY)
Admission: RE | Admit: 2018-04-02 | Discharge: 2018-04-02 | Disposition: A | Discharge status: Home / Self Care | Payer: BLUE CROSS/BLUE SHIELD | Source: Ambulatory Visit | Attending: General Surgery | Admitting: General Surgery

## 2018-04-02 ENCOUNTER — Encounter (HOSPITAL_COMMUNITY)
Admission: RE | Disposition: A | Discharge status: Home / Self Care | Payer: Self-pay | Source: Ambulatory Visit | Attending: General Surgery

## 2018-04-02 DIAGNOSIS — Z800 Family history of malignant neoplasm of digestive organs: Secondary | ICD-10-CM | POA: Diagnosis not present

## 2018-04-02 DIAGNOSIS — K219 Gastro-esophageal reflux disease without esophagitis: Secondary | ICD-10-CM | POA: Insufficient documentation

## 2018-04-02 DIAGNOSIS — Z79899 Other long term (current) drug therapy: Secondary | ICD-10-CM | POA: Diagnosis not present

## 2018-04-02 DIAGNOSIS — E785 Hyperlipidemia, unspecified: Secondary | ICD-10-CM | POA: Diagnosis not present

## 2018-04-02 DIAGNOSIS — Z9221 Personal history of antineoplastic chemotherapy: Secondary | ICD-10-CM | POA: Diagnosis not present

## 2018-04-02 DIAGNOSIS — C50911 Malignant neoplasm of unspecified site of right female breast: Secondary | ICD-10-CM

## 2018-04-02 DIAGNOSIS — Z853 Personal history of malignant neoplasm of breast: Secondary | ICD-10-CM | POA: Diagnosis not present

## 2018-04-02 DIAGNOSIS — I10 Essential (primary) hypertension: Secondary | ICD-10-CM | POA: Insufficient documentation

## 2018-04-02 DIAGNOSIS — Z452 Encounter for adjustment and management of vascular access device: Secondary | ICD-10-CM | POA: Diagnosis present

## 2018-04-02 HISTORY — PX: PORT-A-CATH REMOVAL: SHX5289

## 2018-04-02 SURGERY — MINOR REMOVAL PORT-A-CATH
Anesthesia: LOCAL | Laterality: Left

## 2018-04-02 MED ORDER — LIDOCAINE HCL (PF) 1 % IJ SOLN
INTRAMUSCULAR | Status: AC
  Filled 2018-04-02: qty 30

## 2018-04-02 MED ORDER — CHLORHEXIDINE GLUCONATE CLOTH 2 % EX PADS: 6.0000 | MEDICATED_PAD | Freq: Once | CUTANEOUS | Status: DC

## 2018-04-02 MED ORDER — LIDOCAINE HCL (PF) 1 % IJ SOLN
INTRAMUSCULAR | Status: DC | PRN
  Administered 2018-04-02: 8 mL via SUBCUTANEOUS

## 2018-04-02 MED ORDER — LACTATED RINGERS IV SOLN: INTRAVENOUS | Status: DC

## 2018-04-02 SURGICAL SUPPLY — 21 items
ADH SKN CLS APL DERMABOND .7 (GAUZE/BANDAGES/DRESSINGS) ×1
CHLORAPREP W/TINT 10.5 ML (MISCELLANEOUS) ×2 IMPLANT
CLOTH BEACON ORANGE TIMEOUT ST (SAFETY) ×2 IMPLANT
DECANTER SPIKE VIAL GLASS SM (MISCELLANEOUS) ×2 IMPLANT
DERMABOND ADVANCED (GAUZE/BANDAGES/DRESSINGS) ×1
DERMABOND ADVANCED .7 DNX12 (GAUZE/BANDAGES/DRESSINGS) ×1 IMPLANT
DRAPE PROXIMA HALF (DRAPES) ×2 IMPLANT
ELECT REM PT RETURN 9FT ADLT (ELECTROSURGICAL) ×2
ELECTRODE REM PT RTRN 9FT ADLT (ELECTROSURGICAL) ×1 IMPLANT
GLOVE BIOGEL PI IND STRL 7.0 (GLOVE) ×1 IMPLANT
GLOVE BIOGEL PI INDICATOR 7.0 (GLOVE) ×1
GLOVE SURG SS PI 7.5 STRL IVOR (GLOVE) ×2 IMPLANT
GOWN STRL REUS W/TWL LRG LVL3 (GOWN DISPOSABLE) ×2 IMPLANT
NEEDLE HYPO 25X1 1.5 SAFETY (NEEDLE) ×2 IMPLANT
PENCIL HANDSWITCHING (ELECTRODE) ×2 IMPLANT
SPONGE GAUZE 2X2 8PLY STRL LF (GAUZE/BANDAGES/DRESSINGS) ×2 IMPLANT
SUT MNCRL AB 4-0 PS2 18 (SUTURE) ×2 IMPLANT
SUT VIC AB 3-0 SH 27 (SUTURE) ×2
SUT VIC AB 3-0 SH 27X BRD (SUTURE) ×1 IMPLANT
SYR CONTROL 10ML LL (SYRINGE) ×2 IMPLANT
TOWEL OR 17X26 4PK STRL BLUE (TOWEL DISPOSABLE) ×2 IMPLANT

## 2018-04-02 NOTE — Discharge Instructions (Signed)
Implanted Port Removal, Care After °Refer to this sheet in the next few weeks. These instructions provide you with information about caring for yourself after your procedure. Your health care provider may also give you more specific instructions. Your treatment has been planned according to current medical practices, but problems sometimes occur. Call your health care provider if you have any problems or questions after your procedure. °What can I expect after the procedure? °After the procedure, it is common to have: °· Soreness or pain near your incision. °· Some swelling or bruising near your incision. ° °Follow these instructions at home: °Medicines °· Take over-the-counter and prescription medicines only as told by your health care provider. °· If you were prescribed an antibiotic medicine, take it as told by your health care provider. Do not stop taking the antibiotic even if you start to feel better. °Bathing °· Do not take baths, swim, or use a hot tub until your health care provider approves. Ask your health care provider if you can take showers. You may only be allowed to take sponge baths for bathing. °Incision care °· Follow instructions from your health care provider about how to take care of your incision. Make sure you: °? Wash your hands with soap and water before you change your bandage (dressing). If soap and water are not available, use hand sanitizer. °? Change your dressing as told by your health care provider. °? Keep your dressing dry. °? Leave stitches (sutures), skin glue, or adhesive strips in place. These skin closures may need to stay in place for 2 weeks or longer. If adhesive strip edges start to loosen and curl up, you may trim the loose edges. Do not remove adhesive strips completely unless your health care provider tells you to do that. °· Check your incision area every day for signs of infection. Check for: °? More redness, swelling, or pain. °? More fluid or  blood. °? Warmth. °? Pus or a bad smell. °Driving °· If you received a sedative, do not drive for 24 hours after the procedure. °· If you did not receive a sedative, ask your health care provider when it is safe to drive. °Activity °· Return to your normal activities as told by your health care provider. Ask your health care provider what activities are safe for you. °· Until your health care provider says it is safe: °? Do not lift anything that is heavier than 10 lb (4.5 kg). °? Do not do activities that involve lifting your arms over your head. °General instructions °· Do not use any tobacco products, such as cigarettes, chewing tobacco, and e-cigarettes. Tobacco can delay healing. If you need help quitting, ask your health care provider. °· Keep all follow-up visits as told by your health care provider. This is important. °Contact a health care provider if: °· You have more redness, swelling, or pain around your incision. °· You have more fluid or blood coming from your incision. °· Your incision feels warm to the touch. °· You have pus or a bad smell coming from your incision. °· You have a fever. °· You have pain that is not relieved by your pain medicine. °Get help right away if: °· You have chest pain. °· You have difficulty breathing. °This information is not intended to replace advice given to you by your health care provider. Make sure you discuss any questions you have with your health care provider. °Document Released: 10/24/2015 Document Revised: 04/19/2016 Document Reviewed: 08/17/2015 °Elsevier Interactive Patient   Education © 2018 [employer] ° °

## 2018-04-02 NOTE — Op Note (Signed)
Patient:  Kimberly Roman  DOB:  [birth year]  MRN:  [id number]   Preop Diagnosis: Right breast carcinoma, finished with chemotherapy  Postop Diagnosis: Same  Procedure: Port-A-Cath removal  Surgeon: Aviva Signs, MD  Anes: Local  Indications: Patient is a 62 year old black female who has finished chemotherapy for right breast carcinoma.  The risks and benefits of the procedure were fully explained to the patient, who gave informed consent.  Procedure note: The patient was placed in supine position.  The left upper chest was prepped and draped using the usual sterile technique with DuraPrep.  Surgical site confirmation was performed.  1% Xylocaine was used for local anesthesia.  Incision was made through the previous surgical incision site.  The dissection was taken down to the port.  The Port-A-Cath was removed in total without difficulty.  It was disposed of.  Subcutaneous layer was reapproximated using a 3-0 Vicryl interrupted suture.  Skin was closed using a 4-0 Monocryl subcuticular suture.  Dermabond was applied.  All tape and needle counts were correct at the end of the procedure.  Patient was discharged from the minor procedure room in good and stable condition.  Complications: None  EBL: Minimal  Specimen: None

## 2018-04-02 NOTE — Interval H&P Note (Signed)
History and Physical Interval Note:  04/02/2018 8:35 AM  Kimberly Roman  has presented today for surgery, with the diagnosis of right breast cancer  The various methods of treatment have been discussed with the patient and family. After consideration of risks, benefits and other options for treatment, the patient has consented to  Procedure(s): MINOR REMOVAL PORT-A-CATH (Left) as a surgical intervention .  The patient's history has been reviewed, patient examined, no change in status, stable for surgery.  I have reviewed the patient's chart and labs.  Questions were answered to the patient's satisfaction.     Aviva Signs

## 2018-04-03 ENCOUNTER — Encounter (HOSPITAL_COMMUNITY): Payer: BLUE CROSS/BLUE SHIELD

## 2018-04-04 ENCOUNTER — Encounter (HOSPITAL_COMMUNITY): Payer: Self-pay | Admitting: General Surgery

## 2018-05-07 NOTE — Progress Notes (Signed)
Diagnosis Infiltrating ductal carcinoma of right breast ([place]) - Plan: CBC with Differential/Platelet, Comprehensive metabolic panel, Lactate dehydrogenase, Cancer antigen 15-3, Cancer antigen 27.29  Staging Cancer Staging Invasive ductal carcinoma of breast ([place]) Staging form: Breast, AJCC 7th Edition - Clinical stage from 09/28/2016: Stage IA (T1c, N0, M0) - Signed by Baird Cancer, PA-C on 10/22/2016   Assessment and Plan:  1.  Stage IA (T1cN0M0) invasive ductal carcinoma of right breast; ER-/PR-/HER2-.  Pt was diagnosed in 08/2016.  Treated with lumpectomy and adjuvant chemotherapy with Adriamycin/Cytoxan x 4 cycles. Completed 9 of 12 planned cycles of weekly Taxol; chemo course was complicated by grade 3 peripheral neuropathy and Taxol was subsequently discontinued after cycle #9.  Completed chemo on 02/21/17.  Went on to complete adjuvant breast radiation in [place] on 04/26/17.   She has mammogram done 01/21/2018 that showed  IMPRESSION: 1. Single dilated duct in the 3 o'clock retroareolar right breast, containing a 9 x 5 x 3 mm intraductal mass. This most likely represents an intraductal papilloma. However, ductal carcinoma in situ is also a possibility. The dilated duct extends into a 9 mm cystic area at the junction of the areola and nipple at the 1:30 o'clock position. 2. Small amount of clear yellow discharge elicited from a single duct in the central right nipple. 3. 9 mm densely calcified fibroadenoma in the 10 o'clock retroareolar right breast.  RECOMMENDATION: Ultrasound guided core needle biopsy of the 9 mm intraductal mass in the 3 o'clock retroareolar right breast. This has been discussed with the patient and scheduled at 12:30 p.m. on 02/11/2018.    Pathology from biopsy done 02/11/2018 reviewed with pt and showed  Breast, right, needle core biopsy, 3:00 periareolar - BENIGN SCLEROSING PAPILLOMA. SEE NOTE - NEGATIVE FOR IN-SITU OR INVASIVE CARCINOMA  She is  referred for surgical evaluation.  Pt also desires to have Port removed.  She Will RTC in 4-6 weeks for follow-up pending surgical evaluation.    2.  Right breast papilloma.  This was noted on Mammogram done 01/21/2018.  She had biopsy done 02/11/2018 with pathology returning as papilloma.   She will be referred to surgery for evaluation, especially due to history of triple negative breast cancer.  She will RTC  in 4-6 weeks for follow-up pending surgical evaluation.    3.  Port-a-cath.  Pt desires to have port removed.  She is referred to surgery to discuss as well as for evaluation due to right breast papilloma.   4.  HTN.  BP is 151/80.  Follow-up with PCP.    Interval History:  62 yr old female with Stage IA (T1cN0M0) invasive ductal carcinoma of right breast; ER-/PR-/HER2-.  Pt was diagnosed in 08/2016.  Treated with lumpectomy and adjuvant chemotherapy with Adriamycin/Cytoxan x 4 cycles. Completed 9 of 12 planned cycles of weekly Taxol; chemo course was complicated by grade 3 peripheral neuropathy and Taxol was subsequently discontinued after cycle #9.  Completed chemo on 02/21/17.  Went on to complete adjuvant breast radiation in [place] on 04/26/17.   Current Status:  Pt is seen today for follow-up.  She is here to go over mammogram and right breast biopsy.       Invasive ductal carcinoma of breast ([place])   08/13/2016 Mammogram    BI-RADS CATEGORY  0: Incomplete.      08/21/2016 Mammogram    BI-RADS CATEGORY  5: Highly suggestive of malignancy. Suspicious 1.2 cm mass 10 o'clock position right breast.      08/21/2016  Breast US    Targeted ultrasound is performed, showing a hypoechoic irregular mass with indistinct margins at 10 o'clock position 9 cm from the nipple measuring approximately 1.1 x 1.2 x 1.0 cm. No additional masses are seen in this region of the right breast on ultrasound.      08/28/2016 Procedure    Right needle core biopsy      09/26/2016 Procedure    Right upper outer  quadrant lumpectomy by Dr. Arnoldo Morale      09/28/2016 Pathology Results    Invasive ductal carcinoma, grade 3, standing 1.7 cm. High-grade ductal carcinoma in situ with comedonecrosis. Invasive carcinoma in less than 0.1 cm of the superior medial margin focally. In situ carcinoma is less than 0.1 cm of superior medial margin and the posterior margin focally. No LVI. ER negative. PR negative. HER-2 negative. KI-67 90%.      09/28/2016 Cancer Staging    Invasive ductal carcinoma of breast [facility])   Staging form: Breast, AJCC 7th Edition   - Clinical stage from 09/28/2016: Stage IA (T1c, N0, M0) - Signed by Baird Cancer, PA-C on 10/22/2016      09/29/2016 Pathology Results    Invasive ductal carcinoma, grade 2/3. HER-2 negative. ER negative. PR negative. Ki-67 90%.      10/24/2016 Echocardiogram    - Left ventricle: The cavity size was normal. Wall thickness was   normal. Systolic function was normal. The estimated ejection   fraction was in the range of 60% to 65%. Wall motion was normal;   there were no regional wall motion abnormalities.       10/26/2016 Procedure    Port placed placed by Dr. Arnoldo Morale      11/02/2016 -  Chemotherapy    The patient had DOXOrubicin (ADRIAMYCIN) chemo injection 106 mg, 60 mg/m2 = 106 mg, Intravenous,  Once, 4 of 4 cycles  palonosetron (ALOXI) injection 0.25 mg, 0.25 mg, Intravenous,  Once, 4 of 4 cycles  pegfilgrastim (NEULASTA ONPRO KIT) injection 6 mg, 6 mg, Subcutaneous, Once, 4 of 4 cycles  cyclophosphamide (CYTOXAN) 1,060 mg in sodium chloride 0.9 % 250 mL chemo infusion, 600 mg/m2 = 1,060 mg, Intravenous,  Once, 4 of 4 cycles  fosaprepitant (EMEND) 150 mg, dexamethasone (DECADRON) 12 mg in sodium chloride 0.9 % 145 mL IVPB, , Intravenous,  Once, 4 of 4 cycles  PACLitaxel (TAXOL) 138 mg in dextrose 5 % 250 mL chemo infusion ( ondansetron (ZOFRAN) 8 mg in sodium chloride 0.9 % 50 mL IVPB, , Intravenous,  Once, 4 of 12 cycles  for chemotherapy  treatment.        03/28/2017 - 04/26/2017 Radiation Therapy    Adjuvant breast radiation [facility]). Right breast 3D CRT 6x photons 16 fractions at 2.65 GY/f to 42.4 Gy TB boost 3 field 6X photons 4 fractions at 2.5 Gy/f to 10 GY             Problem List Patient Active Problem List   Diagnosis Date Noted  . Malignant neoplasm of right female breast ([place]) [C50.911]   . Chemotherapy-induced peripheral neuropathy ([place]) [G62.0, T45.1X5A] 03/18/2018  . Papilloma of right breast [D24.1]   . Hx of breast cancer [Z85.3]   . Ankylosing spondylitis ([place]) [M45.9] 11/17/2016  . GERD (gastroesophageal reflux disease) [K21.9] 11/17/2016  . Invasive ductal carcinoma of breast ([place]) [C50.919] 10/22/2016    Past Medical History Past Medical History:  Diagnosis Date  . Ankylosing spondylitis ([place])   . Arthritis   . Breast cancer (  [place])    right  . Carpal tunnel syndrome   . Environmental allergies   . GERD (gastroesophageal reflux disease)   . Hyperlipemia   . Hypertension   . Invasive ductal carcinoma of breast ([place]) 10/22/2016    Past Surgical History Past Surgical History:  Procedure Laterality Date  . ABDOMINAL HYSTERECTOMY    . BREAST BIOPSY Right 03/18/2018   Procedure: RIGHT BREAST BIOPSY;  Surgeon: Aviva Signs, MD;  Location: [facility];  Service: General;  Laterality: Right;  . CARPAL TUNNEL RELEASE Right 04/27/2013   Procedure: RIGHT CARPAL TUNNEL RELEASE;  Surgeon: Carole Civil, MD;  Location: [facility] OR[facility];  Service: Orthopedics;  Laterality: Right;  . COLONOSCOPY N/A 01/20/2013   Procedure: COLONOSCOPY;  Surgeon: Danie Binder, MD;  Location: [facility] END[facility] SU[facility];  Service: Endoscopy;  Laterality: N/A;  9:30 AM  . HEMATOMA EVACUATION Right 10/26/2016   Procedure: EVACUATION HEMATOMA;  Surgeon: Aviva Signs, MD;  Location: [facility];  Service: General;  Laterality: Right;  . PARTIAL MASTECTOMY WITH AXILLARY SENTINEL LYMPH NODE BIOPSY Right 09/26/2016   Procedure: PARTIAL MASTECTOMY WITH  AXILLARY SENTINEL LYMPH NODE BIOPSY;  Surgeon: Aviva Signs, MD;  Location: [facility];  Service: General;  Laterality: Right;  . PORT-A-CATH REMOVAL Left 04/02/2018   Procedure: MINOR REMOVAL PORT-A-CATH;  Surgeon: Aviva Signs, MD;  Location: [facility];  Service: General;  Laterality: Left;  . PORTACATH PLACEMENT Left 10/26/2016   Procedure: INSERTION PORT-A-CATH;  Surgeon: Aviva Signs, MD;  Location: [facility];  Service: General;  Laterality: Left;    Family History Family History  Problem Relation Age of Onset  . Arthritis Unknown   . Hypertension Mother   . Hypertension Father   . Colon cancer Neg Hx      Social History  reports that she has never smoked. She has never used smokeless tobacco. She reports that she does not drink alcohol or use drugs.  Medications  Current Outpatient Medications:  .  acetaminophen (TYLENOL) 650 MG CR tablet, Take 650 mg by mouth every 8 (eight) hours as needed for pain., Disp: , Rfl:  .  amLODipine (NORVASC) 10 MG tablet, Take 10 mg by mouth every evening. , Disp: , Rfl:  .  Artificial Tear Solution (SOOTHE XP) SOLN, Apply 2 drops to eye 3 (three) times daily as needed (for dry/irritated eyes). , Disp: , Rfl:  .  loratadine (CLARITIN) 10 MG tablet, Take 10 mg by mouth daily as needed for allergies., Disp: , Rfl:  .  omeprazole (PRILOSEC) 20 MG capsule, Take 20 mg by mouth daily before breakfast. , Disp: , Rfl: 0 .  sodium chloride (OCEAN) 0.65 % SOLN nasal spray, Place 1 spray into both nostrils 4 (four) times daily as needed (for dry/congestion/allergies.)., Disp: , Rfl:   Allergies Atorvastatin and Lipitor [atorvastatin calcium]  Review of Systems Review of Systems - Oncology ROS as per HPI otherwise 12 point ROS is negative.   Physical Exam  Vitals Wt Readings from Last 3 Encounters:  03/25/18 171 lb (77.6 kg)  03/25/18 171 lb (77.6 kg)  03/12/18 170 lb (77.1 kg)   Temp Readings from Last 3 Encounters:  04/02/18 98.1 F (36.7 C) (Oral)   03/25/18 (!) 97.3 F (36.3 C)  03/18/18 97.8 F (36.6 C) (Oral)   BP Readings from Last 3 Encounters:  04/02/18 130/84  03/25/18 (!) 153/90  03/25/18 (!) 146/90   Pulse Readings from Last 3 Encounters:  04/02/18 70  03/25/18 84  03/25/18 88   Constitutional:  Well-developed, well-nourished, and in no distress.   HENT: Head: Normocephalic and atraumatic.  Mouth/Throat: No oropharyngeal exudate. Mucosa moist. Eyes: Pupils are equal, round, and reactive to light. Conjunctivae are normal. No scleral icterus.  Neck: Normal range of motion. Neck supple. No JVD present.  Cardiovascular: Normal rate, regular rhythm and normal heart sounds.  Exam reveals no gallop and no friction rub.   No murmur heard. Pulmonary/Chest: Effort normal and breath sounds normal. No respiratory distress. No wheezes.No rales.  Abdominal: Soft. Bowel sounds are normal. No distension. There is no tenderness. There is no guarding.  Musculoskeletal: No edema or tenderness.  Lymphadenopathy: No cervical, axillary or supraclavicular adenopathy.  Neurological: Alert and oriented to person, place, and time. No cranial nerve deficit.  Skin: Skin is warm and dry. No rash noted. No erythema. No pallor.  Psychiatric: Affect and judgment normal.  Bilateral breast exam:  Chaperone present.  Palpable area in right breast 3:00 position.  No dominant masses noted in left breast.    Labs Appointment on 02/14/2018  Component Date Value Ref Range Status  . WBC 02/14/2018 8.2  4.0 - 10.5 K/uL Final  . RBC 02/14/2018 4.22  3.87 - 5.11 MIL/uL Final  . Hemoglobin 02/14/2018 12.2  12.0 - 15.0 g/dL Final  . HCT 02/14/2018 38.6  36.0 - 46.0 % Final  . MCV 02/14/2018 91.5  78.0 - 100.0 fL Final  . MCH 02/14/2018 28.9  26.0 - 34.0 pg Final  . MCHC 02/14/2018 31.6  30.0 - 36.0 g/dL Final  . RDW 02/14/2018 13.4  11.5 - 15.5 % Final  . Platelets 02/14/2018 287  150 - 400 K/uL Final  . Neutrophils Relative % 02/14/2018 73  % Final  .  Neutro Abs 02/14/2018 6.0  1.7 - 7.7 K/uL Final  . Lymphocytes Relative 02/14/2018 19  % Final  . Lymphs Abs 02/14/2018 1.5  0.7 - 4.0 K/uL Final  . Monocytes Relative 02/14/2018 7  % Final  . Monocytes Absolute 02/14/2018 0.6  0.1 - 1.0 K/uL Final  . Eosinophils Relative 02/14/2018 1  % Final  . Eosinophils Absolute 02/14/2018 0.1  0.0 - 0.7 K/uL Final  . Basophils Relative 02/14/2018 0  % Final  . Basophils Absolute 02/14/2018 0.0  0.0 - 0.1 K/uL Final   Performed at [facility], [address] [place], [place] [address]  . Sodium 02/14/2018 140  135 - 145 mmol/L Final  . Potassium 02/14/2018 3.4* 3.5 - 5.1 mmol/L Final  . Chloride 02/14/2018 106  101 - 111 mmol/L Final  . CO2 02/14/2018 23  22 - 32 mmol/L Final  . Glucose, Bld 02/14/2018 136* 65 - 99 mg/dL Final  . BUN 02/14/2018 14  6 - 20 mg/dL Final  . Creatinine, Ser 02/14/2018 0.72  0.44 - 1.00 mg/dL Final  . Calcium 02/14/2018 9.4  8.9 - 10.3 mg/dL Final  . Total Protein 02/14/2018 7.8  6.5 - 8.1 g/dL Final  . Albumin 02/14/2018 4.2  3.5 - 5.0 g/dL Final  . AST 02/14/2018 23  15 - 41 U/L Final  . ALT 02/14/2018 22  14 - 54 U/L Final  . Alkaline Phosphatase 02/14/2018 129* 38 - 126 U/L Final  . Total Bilirubin 02/14/2018 0.4  0.3 - 1.2 mg/dL Final  . GFR calc non Af Amer 02/14/2018 >60  >60 mL/min Final  . GFR calc Af Amer 02/14/2018 >60  >60 mL/min Final   Comment: (NOTE) The eGFR has been calculated using the CKD EPI equation. This  calculation has not been validated in all clinical situations. eGFR's persistently <60 mL/min signify possible Chronic Kidney Disease.   Georgiann Hahn gap 02/14/2018 11  5 - 15 Final   Performed at [facility], [address], [place], [place] [address]  . LDH 02/14/2018 194* 98 - 192 U/L Final   Performed at [facility], [address] [place], [place] [address]  . CA 15-3 02/14/2018 13.6  0.0 - 25.0 U/mL Final   Comment: (NOTE) Roche [employer]ostics Electrochemiluminescence Immunoassay  (ECLIA) Values obtained with different assay methods or kits cannot be used interchangeably.  Results cannot be interpreted as absolute evidence of the presence or absence of malignant disease. Performed At: [facility] [place], [place] [id number] Rush Farmer MD JS:[id number] Performed at [facility], [address] [place], [place] [address]   . CA 27.29 02/14/2018 19.1  0.0 - 38.6 U/mL Final   Comment: (NOTE) Siemens Centaur Immunochemiluminometric Methodology [facility]) Values obtained with different assay methods or kits cannot be used interchangeably. Results cannot be interpreted as absolute evidence of the presence or absence of malignant disease. Performed At: [facility] [place], [place] [id number] Rush Farmer MD EH:[id number] Performed at [facility], [address] [place], [place] [address]      Pathology Orders Placed This Encounter  Procedures  . CBC with Differential/Platelet    Standing Status:   Future    Number of Occurrences:   1    Standing Expiration Date:   02/15/2019  . Comprehensive metabolic panel    Standing Status:   Future    Number of Occurrences:   1    Standing Expiration Date:   02/15/2019  . Lactate dehydrogenase    Standing Status:   Future    Number of Occurrences:   1    Standing Expiration Date:   02/15/2019  . Cancer antigen 15-3    Standing Status:   Future    Number of Occurrences:   1    Standing Expiration Date:   02/14/2019  . Cancer antigen 27.29    Standing Status:   Future    Number of Occurrences:   1    Standing Expiration Date:   02/14/2019       Zoila Shutter MD

## 2018-06-13 IMAGING — MG MAMMOGRAPHIC STEREOTACTIC GUIDED POST-BIOPSY CLIP PLACEMENT
2 series · 2 of 2 positions shown · non-contrast
Comparison: Previous exam(s).

CLINICAL DATA: Post right breast ultrasound-guided biopsy.

EXAM:
DIAGNOSTIC RIGHT MAMMOGRAM POST ULTRASOUND BIOPSY

[R CC]
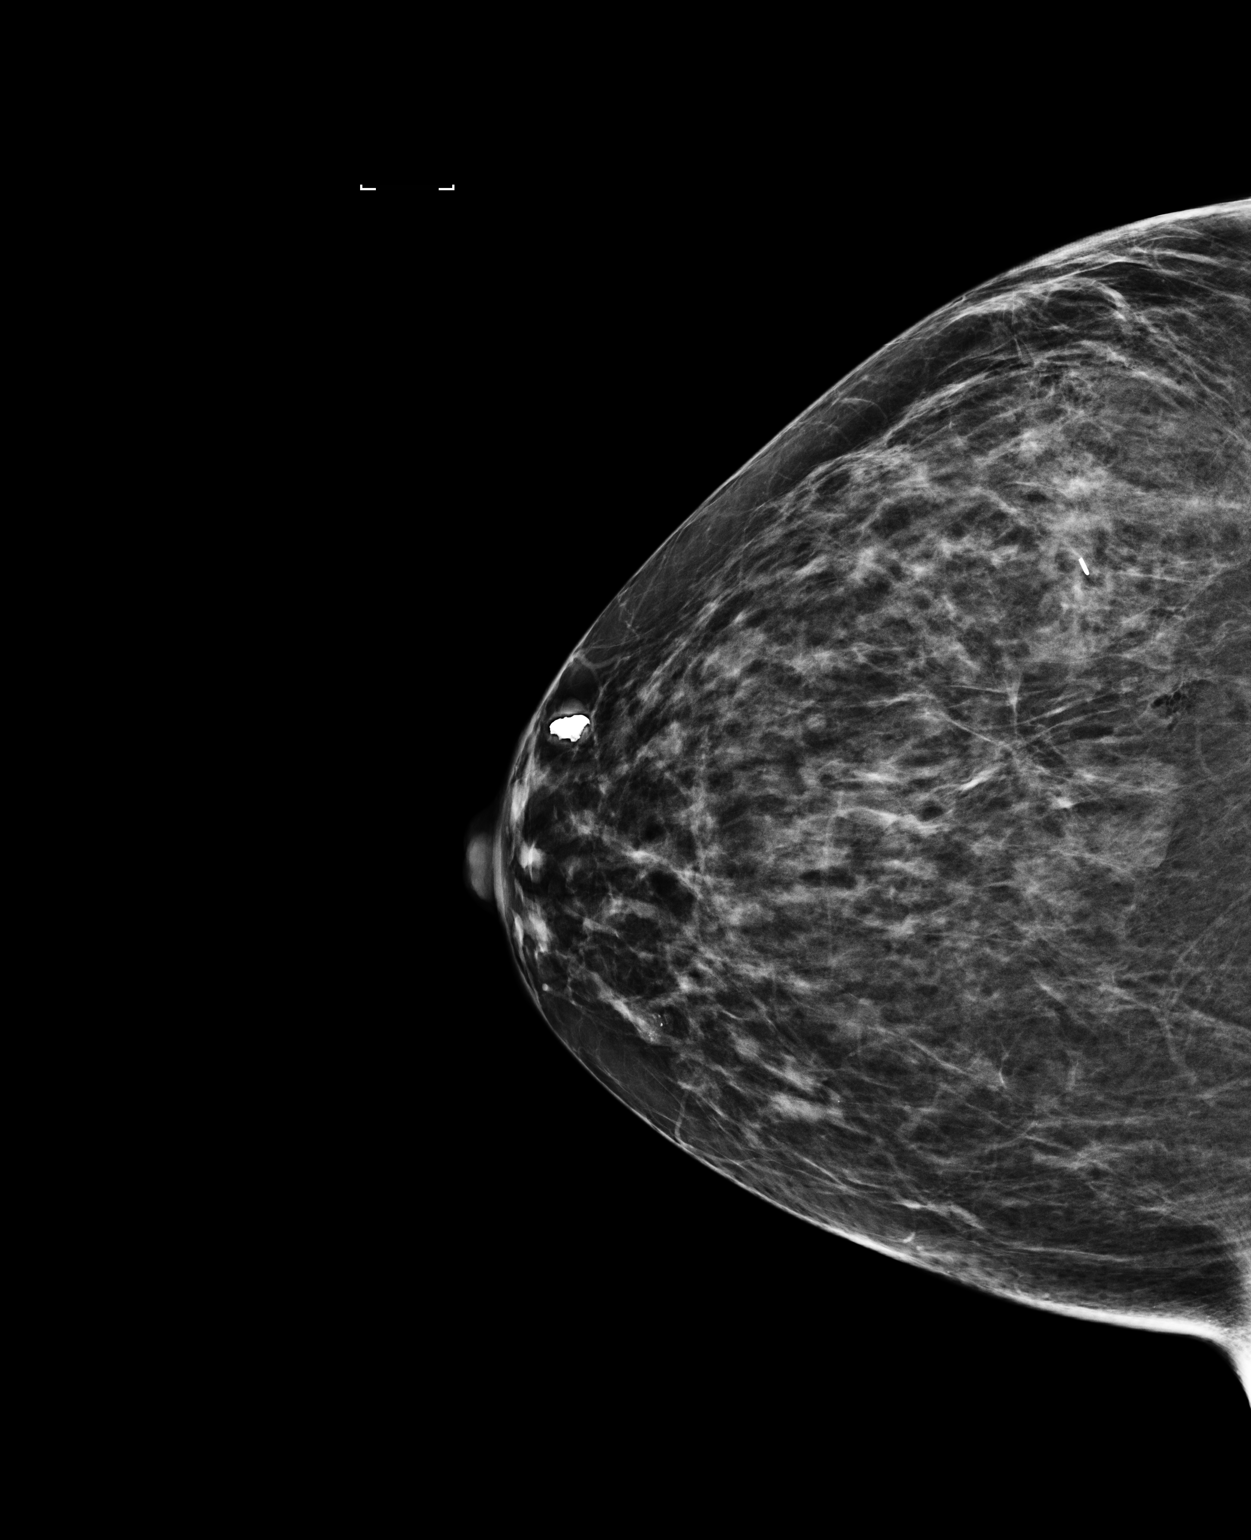

[R ML]
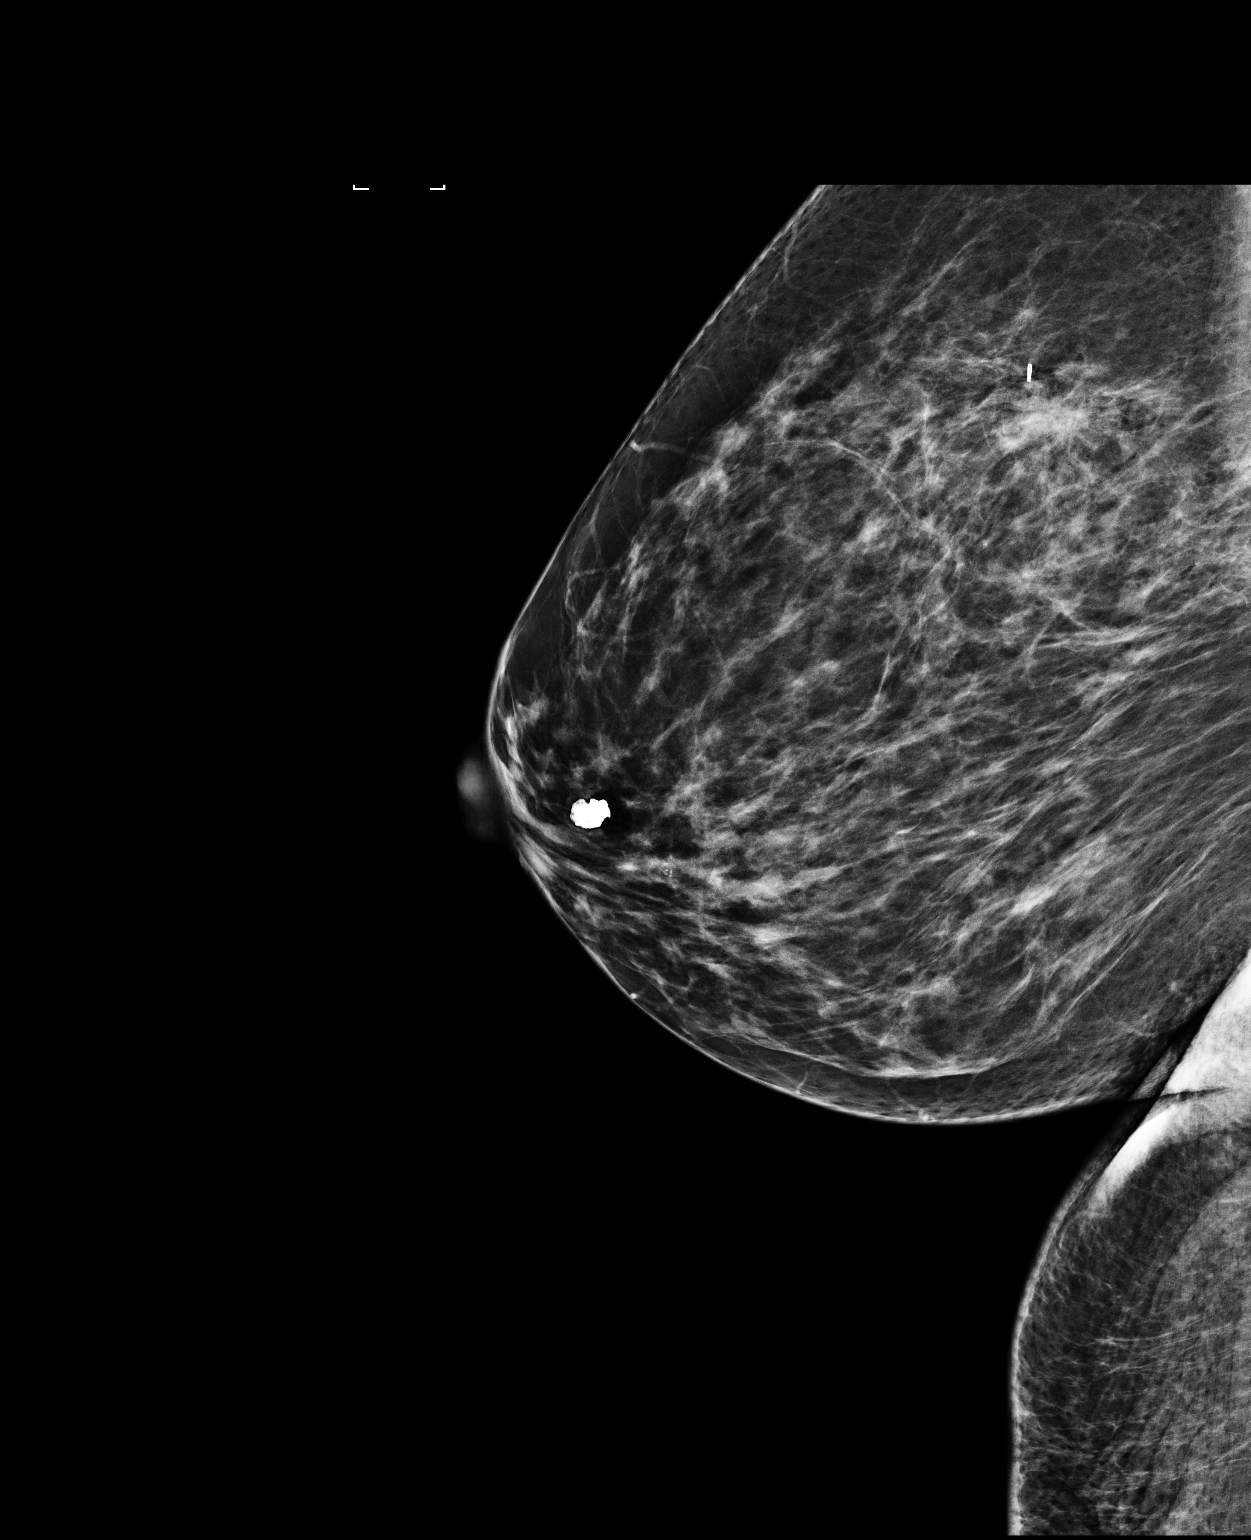

[2 of 2 positions shown; findings below may reference images not displayed]

FINDINGS: Mammographic images were obtained following ultrasound guided biopsy
of the mass located within the right breast at 10 o'clock position 9
cm from the nipple. The ribbon shaped clip is located along the
superior border of the mass.
IMPRESSION: Clip is located along the superior border of the right breast mass.

Final Assessment: Post Procedure Mammograms for Marker Placement

## 2018-07-10 IMAGING — DX DG CHEST 2V
2 series · 2 of 2 positions shown · non-contrast
Comparison: No recent studies in PACs

CLINICAL DATA: History of breast malignancy, hypertension, and
hyperlipidemia.

EXAM:
CHEST  2 VIEW

[chest pa]
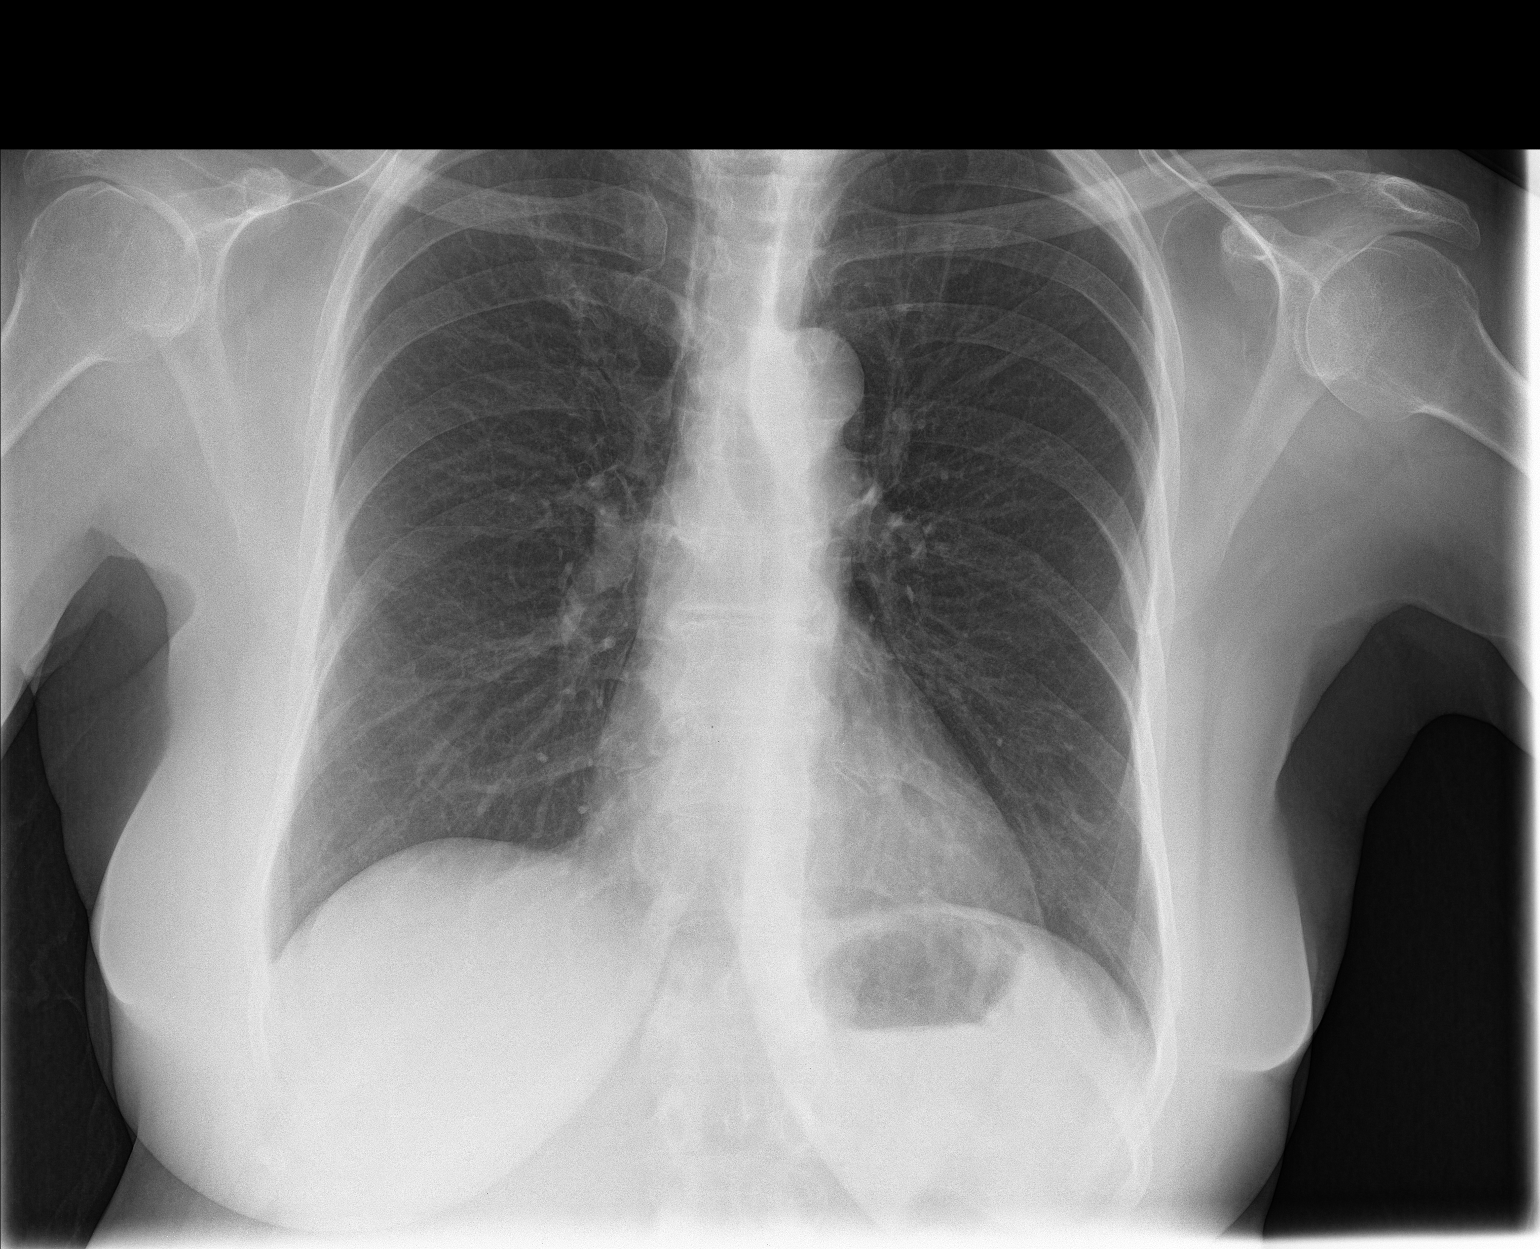

[chest lat]
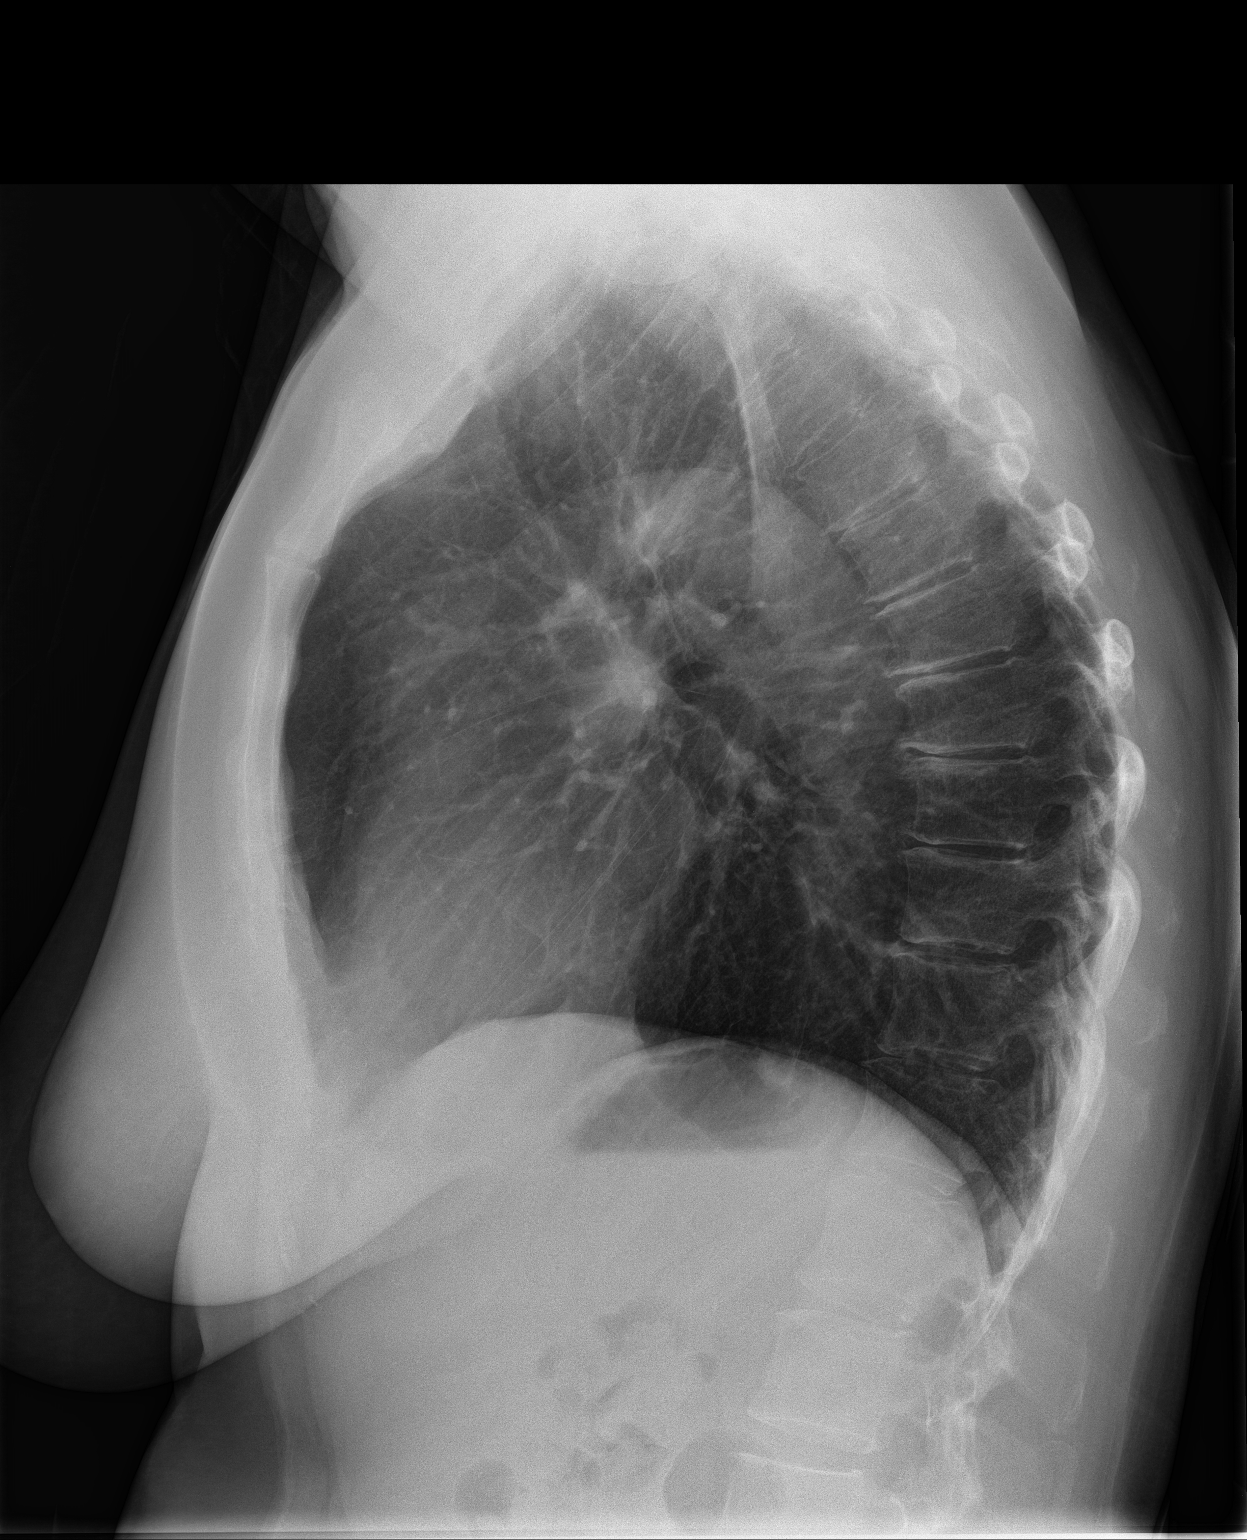

[2 of 2 positions shown; findings below may reference images not displayed]

FINDINGS: The lungs are mildly hyperinflated and clear. The heart and
pulmonary vascularity are normal. The mediastinum is normal in
width. There is no pleural effusion. No pulmonary parenchymal
nodules or masses are observed. No mediastinal or hilar
lymphadenopathy is demonstrated. The bony thorax exhibits no acute
abnormality.
IMPRESSION: Mild hyperinflation consistent with COPD or reactive airway disease.
No acute cardiopulmonary abnormality.

## 2018-08-07 ENCOUNTER — Other Ambulatory Visit (HOSPITAL_COMMUNITY): Payer: Self-pay | Admitting: Internal Medicine

## 2018-08-07 DIAGNOSIS — E049 Nontoxic goiter, unspecified: Secondary | ICD-10-CM

## 2018-08-14 ENCOUNTER — Ambulatory Visit (HOSPITAL_COMMUNITY)
Admission: RE | Admit: 2018-08-14 | Discharge: 2018-08-14 | Disposition: A | Discharge status: Home / Self Care | Payer: BLUE CROSS/BLUE SHIELD | Source: Ambulatory Visit | Attending: Internal Medicine | Admitting: Internal Medicine

## 2018-08-14 DIAGNOSIS — E042 Nontoxic multinodular goiter: Secondary | ICD-10-CM | POA: Diagnosis not present

## 2018-08-14 DIAGNOSIS — E049 Nontoxic goiter, unspecified: Secondary | ICD-10-CM

## 2018-09-29 ENCOUNTER — Other Ambulatory Visit (HOSPITAL_COMMUNITY): Payer: Self-pay | Admitting: Internal Medicine

## 2018-09-29 DIAGNOSIS — Z9889 Other specified postprocedural states: Secondary | ICD-10-CM

## 2018-10-07 ENCOUNTER — Inpatient Hospital Stay (HOSPITAL_COMMUNITY): Payer: BLUE CROSS/BLUE SHIELD | Attending: Hematology

## 2018-10-07 ENCOUNTER — Ambulatory Visit (HOSPITAL_COMMUNITY): Admission: RE | Admit: 2018-10-07 | Payer: BLUE CROSS/BLUE SHIELD | Source: Ambulatory Visit

## 2018-10-07 ENCOUNTER — Ambulatory Visit (HOSPITAL_COMMUNITY): Payer: BLUE CROSS/BLUE SHIELD

## 2018-10-07 ENCOUNTER — Ambulatory Visit (HOSPITAL_COMMUNITY)
Admission: RE | Admit: 2018-10-07 | Discharge: 2018-10-07 | Disposition: A | Discharge status: Home / Self Care | Payer: BLUE CROSS/BLUE SHIELD | Source: Ambulatory Visit | Attending: Internal Medicine | Admitting: Internal Medicine

## 2018-10-07 DIAGNOSIS — Z853 Personal history of malignant neoplasm of breast: Secondary | ICD-10-CM | POA: Diagnosis present

## 2018-10-07 DIAGNOSIS — C50911 Malignant neoplasm of unspecified site of right female breast: Secondary | ICD-10-CM | POA: Insufficient documentation

## 2018-10-07 LAB — COMPREHENSIVE METABOLIC PANEL
ALT: 28 U/L (ref 0–44)
AST: 25 U/L (ref 15–41)
Albumin: 4.3 g/dL (ref 3.5–5.0)
Alkaline Phosphatase: 114 U/L (ref 38–126)
Anion gap: 6 (ref 5–15)
BUN: 10 mg/dL (ref 8–23)
CO2: 30 mmol/L (ref 22–32)
Calcium: 9.5 mg/dL (ref 8.9–10.3)
Chloride: 104 mmol/L (ref 98–111)
Creatinine, Ser: 0.71 mg/dL (ref 0.44–1.00)
GFR calc Af Amer: >60 mL/min (ref >60)
GFR calc non Af Amer: >60 mL/min (ref >60)
Glucose, Bld: 107 mg/dL — ABNORMAL HIGH (ref 70–99)
Potassium: 3.4 mmol/L — ABNORMAL LOW (ref 3.5–5.1)
Sodium: 140 mmol/L (ref 135–145)
Total Bilirubin: 0.8 mg/dL (ref 0.3–1.2)
Total Protein: 7.8 g/dL (ref 6.5–8.1)

## 2018-10-07 LAB — CBC WITH DIFFERENTIAL/PLATELET
Abs Immature Granulocytes: 0.01 10*3/uL (ref 0.00–0.07)
Basophils Absolute: 0.1 10*3/uL (ref 0.0–0.1)
Basophils Relative: 1 %
Eosinophils Absolute: 0.1 10*3/uL (ref 0.0–0.5)
Eosinophils Relative: 2 %
HCT: 37.9 % (ref 36.0–46.0)
Hemoglobin: 11.8 g/dL — ABNORMAL LOW (ref 12.0–15.0)
Immature Granulocytes: 0 %
Lymphocytes Relative: 20 %
Lymphs Abs: 1.2 10*3/uL (ref 0.7–4.0)
MCH: 28.6 pg (ref 26.0–34.0)
MCHC: 31.1 g/dL (ref 30.0–36.0)
MCV: 91.8 fL (ref 80.0–100.0)
Monocytes Absolute: 0.5 10*3/uL (ref 0.1–1.0)
Monocytes Relative: 8 %
Neutro Abs: 4.1 10*3/uL (ref 1.7–7.7)
Neutrophils Relative %: 69 %
Platelets: 269 10*3/uL (ref 150–400)
RBC: 4.13 MIL/uL (ref 3.87–5.11)
RDW: 13.3 % (ref 11.5–15.5)
WBC: 5.9 10*3/uL (ref 4.0–10.5)
nRBC: 0 % (ref 0.0–0.2)

## 2018-10-07 LAB — LACTATE DEHYDROGENASE: LDH: 191 U/L (ref 98–192)

## 2018-10-10 ENCOUNTER — Inpatient Hospital Stay (HOSPITAL_BASED_OUTPATIENT_CLINIC_OR_DEPARTMENT_OTHER): Payer: BLUE CROSS/BLUE SHIELD | Admitting: Internal Medicine

## 2018-10-10 ENCOUNTER — Other Ambulatory Visit: Payer: Self-pay

## 2018-10-10 ENCOUNTER — Encounter (HOSPITAL_COMMUNITY): Payer: Self-pay | Admitting: Internal Medicine

## 2018-10-10 VITALS — BP 140/86 | HR 80 | Temp 97.8°F | Resp 18 | Wt 166.0 lb

## 2018-10-10 DIAGNOSIS — Z853 Personal history of malignant neoplasm of breast: Secondary | ICD-10-CM | POA: Diagnosis not present

## 2018-10-10 DIAGNOSIS — C50911 Malignant neoplasm of unspecified site of right female breast: Secondary | ICD-10-CM

## 2018-10-10 NOTE — Progress Notes (Signed)
Diagnosis Infiltrating ductal carcinoma of right breast ([place]) - Plan: CBC with Differential/Platelet, Comprehensive metabolic panel, Lactate dehydrogenase  Staging Cancer Staging Invasive ductal carcinoma of breast ([place]) Staging form: Breast, AJCC 7th Edition - Clinical stage from 09/28/2016: Stage IA (T1c, N0, M0) - Signed by Baird Cancer, PA-C on 10/22/2016   Assessment and Plan: 1.  Stage IA (T1cN0M0) invasive ductal carcinoma of right breast; ER-/PR-/HER2-.  Pt was diagnosed in 08/2016.  History per interval history.  Pt underwent bilateral diagnostic mammogram 10/07/2018 that was reviewed and showed no mammographic evidence of malignancy.  Pt is recommended for bilateral diagnostic mammogram in 09/2019.  Pt will be seen for follow-up in 03/2019 with labs.  Pt had port removed with Dr. Arnoldo Morale.    2.  Right breast papilloma.  Pt developed new (R) nipple discharge and (R) breast nodule.  She had a small, sub-cm nodule palpable at areolar ridge at approx 10 o'clock position of right breast..  She underwent (R) diagnostic mammogram with ultrasound on 01/21/2018 that showed  IMPRESSION: 1. Single dilated duct in the 3 o'clock retroareolar right breast, containing a 9 x 5 x 3 mm intraductal mass. This most likely represents an intraductal papilloma. However, ductal carcinoma in situ is also a possibility. The dilated duct extends into a 9 mm cystic area at the junction of the areola and nipple at the 1:30 o'clock position. 2. Small amount of clear yellow discharge elicited from a single duct in the central right nipple. 3. 9 mm densely calcified fibroadenoma in the 10 o'clock retroareolar right breast.  RECOMMENDATION: Ultrasound guided core needle biopsy of the 9 mm intraductal mass in the 3 o'clock retroareolar right breast. This has been discussed with the patient and scheduled at 12:30 p.m. on 02/11/2018.  Pt had right breast core biopsy done at 3:00 position on 02/11/2018 with  pathology returning as benign papilloma.    She was seen by Dr. Arnoldo Morale and underwent Right breast excision on 03/18/2018 with pathology returning as papilloma with no malignancy seen.    Previously, I discussed with her finding is considered a benign lesion.  Bilateral diagnostic mammogram done 10/07/2018 showed postsurgical changes in the right breast with no evidence of malignancy.     3.  HTN.  BP is 140/86.  Follow-up with PCP.    4.  Health maintenance.  GI evaluation as recommended.    25 minutes spent with more than 50% spent in counseling and coordination of care.    Interval History:  Historical data obtained from note dated 03/25/2018.  Stage IA (T1cN0M0) invasive ductal carcinoma of right breast; ER-/PR-/HER2-.  Pt was diagnosed in 08/2016.  Treated with lumpectomy and adjuvant chemotherapy with Adriamycin/Cytoxan x 4 cycles. Completed 9 of 12 planned cycles of weekly Taxol; chemo course was complicated by grade 3 peripheral neuropathy and Taxol was subsequently discontinued after cycle #9.  Completed chemo on 02/21/17.  Went on to complete adjuvant breast radiation in [place] on 04/26/17. She required PRBC transfusion during her radiation therapy for significant anemia. She was not recommended for anti-estrogen therapy due to  triple negative disease.  Mammogram done 10/01/17 and negative for malignancy.   She developed new (R) nipple discharge and (R) breast nodule.  She had a small, sub-cm nodule palpable at areolar ridge at approx 10 o'clock position of right breast..  She underwent (R) diagnostic mammogram with ultrasound on 01/21/2018 that showed  IMPRESSION: 1. Single dilated duct in the 3 o'clock retroareolar right breast, containing a 9  x 5 x 3 mm intraductal mass. This most likely represents an intraductal papilloma. However, ductal carcinoma in situ is also a possibility. The dilated duct extends into a 9 mm cystic area at the junction of the areola and nipple at the 1:30 o'clock  position. 2. Small amount of clear yellow discharge elicited from a single duct in the central right nipple. 3. 9 mm densely calcified fibroadenoma in the 10 o'clock retroareolar right breast.  RECOMMENDATION: Ultrasound guided core needle biopsy of the 9 mm intraductal mass in the 3 o'clock retroareolar right breast. This has been discussed with the patient and scheduled at 12:30 p.m. on 02/11/2018.  Pt had right breast core biopsy done at 3:00 position on 02/11/2018 with pathology returning as benign papilloma.    She was seen by Dr. Arnoldo Morale and underwent Right breast excision on 03/18/2018 with pathology returning as papilloma with no malignancy seen.     Current Status:  Pt is seen today for follow-up to go over recent mammogram.  She had PAC removed.     Invasive ductal carcinoma of breast ([place])   08/13/2016 Mammogram    BI-RADS CATEGORY  0: Incomplete.    08/21/2016 Mammogram    BI-RADS CATEGORY  5: Highly suggestive of malignancy. Suspicious 1.2 cm mass 10 o'clock position right breast.    08/21/2016 Breast US    Targeted ultrasound is performed, showing a hypoechoic irregular mass with indistinct margins at 10 o'clock position 9 cm from the nipple measuring approximately 1.1 x 1.2 x 1.0 cm. No additional masses are seen in this region of the right breast on ultrasound.    08/28/2016 Procedure    Right needle core biopsy    09/26/2016 Procedure    Right upper outer quadrant lumpectomy by Dr. Arnoldo Morale    09/28/2016 Pathology Results    Invasive ductal carcinoma, grade 3, standing 1.7 cm. High-grade ductal carcinoma in situ with comedonecrosis. Invasive carcinoma in less than 0.1 cm of the superior medial margin focally. In situ carcinoma is less than 0.1 cm of superior medial margin and the posterior margin focally. No LVI. ER negative. PR negative. HER-2 negative. KI-67 90%.    09/28/2016 Cancer Staging    Invasive ductal carcinoma of breast [facility])   Staging form: Breast, AJCC  7th Edition   - Clinical stage from 09/28/2016: Stage IA (T1c, N0, M0) - Signed by Baird Cancer, PA-C on 10/22/2016    09/29/2016 Pathology Results    Invasive ductal carcinoma, grade 2/3. HER-2 negative. ER negative. PR negative. Ki-67 90%.    10/24/2016 Echocardiogram    - Left ventricle: The cavity size was normal. Wall thickness was   normal. Systolic function was normal. The estimated ejection   fraction was in the range of 60% to 65%. Wall motion was normal;   there were no regional wall motion abnormalities.     10/26/2016 Procedure    Port placed placed by Dr. Arnoldo Morale    11/02/2016 -  Chemotherapy    The patient had DOXOrubicin (ADRIAMYCIN) chemo injection 106 mg, 60 mg/m2 = 106 mg, Intravenous,  Once, 4 of 4 cycles  palonosetron (ALOXI) injection 0.25 mg, 0.25 mg, Intravenous,  Once, 4 of 4 cycles  pegfilgrastim (NEULASTA ONPRO KIT) injection 6 mg, 6 mg, Subcutaneous, Once, 4 of 4 cycles  cyclophosphamide (CYTOXAN) 1,060 mg in sodium chloride 0.9 % 250 mL chemo infusion, 600 mg/m2 = 1,060 mg, Intravenous,  Once, 4 of 4 cycles  fosaprepitant (EMEND) 150 mg, dexamethasone (DECADRON) 12  mg in sodium chloride 0.9 % 145 mL IVPB, , Intravenous,  Once, 4 of 4 cycles  PACLitaxel (TAXOL) 138 mg in dextrose 5 % 250 mL chemo infusion ( ondansetron (ZOFRAN) 8 mg in sodium chloride 0.9 % 50 mL IVPB, , Intravenous,  Once, 4 of 12 cycles  for chemotherapy treatment.      03/28/2017 - 04/26/2017 Radiation Therapy    Adjuvant breast radiation [facility]). Right breast 3D CRT 6x photons 16 fractions at 2.65 GY/f to 42.4 Gy TB boost 3 field 6X photons 4 fractions at 2.5 Gy/f to 10 GY           Problem List Patient Active Problem List   Diagnosis Date Noted  . Malignant neoplasm of right female breast ([place]) [C50.911]   . Chemotherapy-induced peripheral neuropathy ([place]) [G62.0, T45.1X5A] 03/18/2018  . Papilloma of right breast [D24.1]   . Hx of breast cancer [Z85.3]   . Ankylosing  spondylitis ([place]) [M45.9] 11/17/2016  . GERD (gastroesophageal reflux disease) [K21.9] 11/17/2016  . Invasive ductal carcinoma of breast ([place]) [C50.919] 10/22/2016    Past Medical History Past Medical History:  Diagnosis Date  . Ankylosing spondylitis ([place])   . Arthritis   . Breast cancer ([place])    right  . Carpal tunnel syndrome   . Environmental allergies   . GERD (gastroesophageal reflux disease)   . Hyperlipemia   . Hypertension   . Invasive ductal carcinoma of breast ([place]) 10/22/2016    Past Surgical History Past Surgical History:  Procedure Laterality Date  . ABDOMINAL HYSTERECTOMY    . BREAST BIOPSY Right 03/18/2018   Procedure: RIGHT BREAST BIOPSY;  Surgeon: Aviva Signs, MD;  Location: [facility];  Service: General;  Laterality: Right;  . CARPAL TUNNEL RELEASE Right 04/27/2013   Procedure: RIGHT CARPAL TUNNEL RELEASE;  Surgeon: Carole Civil, MD;  Location: [facility];  Service: Orthopedics;  Laterality: Right;  . COLONOSCOPY N/A 01/20/2013   Procedure: COLONOSCOPY;  Surgeon: Danie Binder, MD;  Location: [facility];  Service: Endoscopy;  Laterality: N/A;  9:30 AM  . HEMATOMA EVACUATION Right 10/26/2016   Procedure: EVACUATION HEMATOMA;  Surgeon: Aviva Signs, MD;  Location: [facility];  Service: General;  Laterality: Right;  . PARTIAL MASTECTOMY WITH AXILLARY SENTINEL LYMPH NODE BIOPSY Right 09/26/2016   Procedure: PARTIAL MASTECTOMY WITH AXILLARY SENTINEL LYMPH NODE BIOPSY;  Surgeon: Aviva Signs, MD;  Location: [facility];  Service: General;  Laterality: Right;  . PORT-A-CATH REMOVAL Left 04/02/2018   Procedure: MINOR REMOVAL PORT-A-CATH;  Surgeon: Aviva Signs, MD;  Location: [facility];  Service: General;  Laterality: Left;  . PORTACATH PLACEMENT Left 10/26/2016   Procedure: INSERTION PORT-A-CATH;  Surgeon: Aviva Signs, MD;  Location: [facility];  Service: General;  Laterality: Left;    Family History Family History  Problem Relation Age of Onset  . Arthritis Unknown   .  Hypertension Mother   . Hypertension Father   . Colon cancer Neg Hx      Social History  reports that she has never smoked. She has never used smokeless tobacco. She reports that she does not drink alcohol or use drugs.  Medications  Current Outpatient Medications:  .  acetaminophen (TYLENOL) 650 MG CR tablet, Take 650 mg by mouth every 8 (eight) hours as needed for pain., Disp: , Rfl:  .  amLODipine (NORVASC) 10 MG tablet, Take 10 mg by mouth every evening. , Disp: , Rfl:  .  Artificial Tear Solution (SOOTHE XP) SOLN, Apply 2 drops to  eye 3 (three) times daily as needed (for dry/irritated eyes). , Disp: , Rfl:  .  loratadine (CLARITIN) 10 MG tablet, Take 10 mg by mouth daily as needed for allergies., Disp: , Rfl:  .  losartan-hydrochlorothiazide (HYZAAR) 50-12.5 MG tablet, Take 1 tablet by mouth daily., Disp: , Rfl:  .  omeprazole (PRILOSEC) 20 MG capsule, Take 20 mg by mouth daily before breakfast. , Disp: , Rfl: 0 .  sodium chloride (OCEAN) 0.65 % SOLN nasal spray, Place 1 spray into both nostrils 4 (four) times daily as needed (for dry/congestion/allergies.)., Disp: , Rfl:   Allergies Atorvastatin and Lipitor [atorvastatin calcium]  Review of Systems Review of Systems - Oncology ROS negative   Physical Exam  Vitals Wt Readings from Last 3 Encounters:  10/10/18 166 lb (75.3 kg)  03/25/18 171 lb (77.6 kg)  03/25/18 171 lb (77.6 kg)   Temp Readings from Last 3 Encounters:  10/10/18 97.8 F (36.6 C) (Oral)  04/02/18 98.1 F (36.7 C) (Oral)  03/25/18 (!) 97.3 F (36.3 C)   BP Readings from Last 3 Encounters:  10/10/18 140/86  04/02/18 130/84  03/25/18 (!) 153/90   Pulse Readings from Last 3 Encounters:  10/10/18 80  04/02/18 70  03/25/18 84   Constitutional: Well-developed, well-nourished, and in no distress.   HENT: Head: Normocephalic and atraumatic.  Mouth/Throat: No oropharyngeal exudate. Mucosa moist. Eyes: Pupils are equal, round, and reactive to light.  Conjunctivae are normal. No scleral icterus.  Neck: Normal range of motion. Neck supple. No JVD present.  Cardiovascular: Normal rate, regular rhythm and normal heart sounds.  Exam reveals no gallop and no friction rub.   No murmur heard. Pulmonary/Chest: Effort normal and breath sounds normal. No respiratory distress. No wheezes.No rales.  Abdominal: Soft. Bowel sounds are normal. No distension. There is no tenderness. There is no guarding.  Musculoskeletal: No edema or tenderness.  Lymphadenopathy: No cervical, axillary or supraclavicular adenopathy.  Neurological: Alert and oriented to person, place, and time. No cranial nerve deficit.  Skin: Skin is warm and dry. No rash noted. No erythema. No pallor.  Psychiatric: Affect and judgment normal.  Breast exam:  Scar with pucker noted in right breast.  No dominant masses palpable bilaterally.    Labs No visits with results within 3 Day(s) from this visit.  Latest known visit with results is:  Appointment on 10/07/2018  Component Date Value Ref Range Status  . WBC 10/07/2018 5.9  4.0 - 10.5 K/uL Final  . RBC 10/07/2018 4.13  3.87 - 5.11 MIL/uL Final  . Hemoglobin 10/07/2018 11.8* 12.0 - 15.0 g/dL Final  . HCT 10/07/2018 37.9  36.0 - 46.0 % Final  . MCV 10/07/2018 91.8  80.0 - 100.0 fL Final  . MCH 10/07/2018 28.6  26.0 - 34.0 pg Final  . MCHC 10/07/2018 31.1  30.0 - 36.0 g/dL Final  . RDW 10/07/2018 13.3  11.5 - 15.5 % Final  . Platelets 10/07/2018 269  150 - 400 K/uL Final  . nRBC 10/07/2018 0.0  0.0 - 0.2 % Final  . Neutrophils Relative % 10/07/2018 69  % Final  . Neutro Abs 10/07/2018 4.1  1.7 - 7.7 K/uL Final  . Lymphocytes Relative 10/07/2018 20  % Final  . Lymphs Abs 10/07/2018 1.2  0.7 - 4.0 K/uL Final  . Monocytes Relative 10/07/2018 8  % Final  . Monocytes Absolute 10/07/2018 0.5  0.1 - 1.0 K/uL Final  . Eosinophils Relative 10/07/2018 2  % Final  . Eosinophils Absolute  10/07/2018 0.1  0.0 - 0.5 K/uL Final  . Basophils  Relative 10/07/2018 1  % Final  . Basophils Absolute 10/07/2018 0.1  0.0 - 0.1 K/uL Final  . Immature Granulocytes 10/07/2018 0  % Final  . Abs Immature Granulocytes 10/07/2018 0.01  0.00 - 0.07 K/uL Final   Performed at [facility], [address] [place], [place] [address]  . Sodium 10/07/2018 140  135 - 145 mmol/L Final  . Potassium 10/07/2018 3.4* 3.5 - 5.1 mmol/L Final  . Chloride 10/07/2018 104  98 - 111 mmol/L Final  . CO2 10/07/2018 30  22 - 32 mmol/L Final  . Glucose, Bld 10/07/2018 107* 70 - 99 mg/dL Final  . BUN 10/07/2018 10  8 - 23 mg/dL Final  . Creatinine, Ser 10/07/2018 0.71  0.44 - 1.00 mg/dL Final  . Calcium 10/07/2018 9.5  8.9 - 10.3 mg/dL Final  . Total Protein 10/07/2018 7.8  6.5 - 8.1 g/dL Final  . Albumin 10/07/2018 4.3  3.5 - 5.0 g/dL Final  . AST 10/07/2018 25  15 - 41 U/L Final  . ALT 10/07/2018 28  0 - 44 U/L Final  . Alkaline Phosphatase 10/07/2018 114  38 - 126 U/L Final  . Total Bilirubin 10/07/2018 0.8  0.3 - 1.2 mg/dL Final  . GFR calc non Af Amer 10/07/2018 >60  >60 mL/min Final  . GFR calc Af Amer 10/07/2018 >60  >60 mL/min Final   Comment: (NOTE) The eGFR has been calculated using the CKD EPI equation. This calculation has not been validated in all clinical situations. eGFR's persistently <60 mL/min signify possible Chronic Kidney Disease.   Georgiann Hahn gap 10/07/2018 6  5 - 15 Final   Performed at [facility], [address], [place], [place] [address]  . LDH 10/07/2018 191  98 - 192 U/L Final   Performed at [facility], [address] [place], [place] [address]     Pathology Orders Placed This Encounter  Procedures  . CBC with Differential/Platelet    Standing Status:   Future    Standing Expiration Date:   10/10/2020  . Comprehensive metabolic panel    Standing Status:   Future    Standing Expiration Date:   10/10/2020  . Lactate dehydrogenase    Standing Status:   Future    Standing Expiration Date:   10/10/2020       Zoila Shutter  MD

## 2018-10-21 ENCOUNTER — Ambulatory Visit (HOSPITAL_COMMUNITY): Payer: BLUE CROSS/BLUE SHIELD | Admitting: Hematology

## 2018-11-01 ENCOUNTER — Other Ambulatory Visit: Payer: Self-pay | Admitting: Nurse Practitioner

## 2018-11-18 NOTE — Progress Notes (Signed)
This encounter was created in error - please disregard.

## 2019-04-17 ENCOUNTER — Other Ambulatory Visit (HOSPITAL_COMMUNITY): Payer: BLUE CROSS/BLUE SHIELD

## 2019-04-24 ENCOUNTER — Ambulatory Visit (HOSPITAL_COMMUNITY): Payer: BLUE CROSS/BLUE SHIELD | Admitting: Hematology

## 2019-09-04 ENCOUNTER — Other Ambulatory Visit: Payer: Self-pay

## 2019-09-04 ENCOUNTER — Inpatient Hospital Stay (HOSPITAL_COMMUNITY): Payer: BLUE CROSS/BLUE SHIELD | Attending: Hematology

## 2019-09-04 DIAGNOSIS — Z853 Personal history of malignant neoplasm of breast: Secondary | ICD-10-CM | POA: Diagnosis not present

## 2019-09-04 DIAGNOSIS — D241 Benign neoplasm of right breast: Secondary | ICD-10-CM | POA: Diagnosis not present

## 2019-09-04 DIAGNOSIS — C50911 Malignant neoplasm of unspecified site of right female breast: Secondary | ICD-10-CM

## 2019-09-04 LAB — CBC WITH DIFFERENTIAL/PLATELET
Abs Immature Granulocytes: 0.03 10*3/uL (ref 0.00–0.07)
Basophils Absolute: 0.1 10*3/uL (ref 0.0–0.1)
Basophils Relative: 1 %
Eosinophils Absolute: 0.1 10*3/uL (ref 0.0–0.5)
Eosinophils Relative: 1 %
HCT: 38.9 % (ref 36.0–46.0)
Hemoglobin: 12.3 g/dL (ref 12.0–15.0)
Immature Granulocytes: 0 %
Lymphocytes Relative: 16 %
Lymphs Abs: 1.5 10*3/uL (ref 0.7–4.0)
MCH: 29.4 pg (ref 26.0–34.0)
MCHC: 31.6 g/dL (ref 30.0–36.0)
MCV: 92.8 fL (ref 80.0–100.0)
Monocytes Absolute: 0.6 10*3/uL (ref 0.1–1.0)
Monocytes Relative: 7 %
Neutro Abs: 7.1 10*3/uL (ref 1.7–7.7)
Neutrophils Relative %: 75 %
Platelets: 298 10*3/uL (ref 150–400)
RBC: 4.19 MIL/uL (ref 3.87–5.11)
RDW: 13.4 % (ref 11.5–15.5)
WBC: 9.4 10*3/uL (ref 4.0–10.5)
nRBC: 0 % (ref 0.0–0.2)

## 2019-09-04 LAB — COMPREHENSIVE METABOLIC PANEL
ALT: 22 U/L (ref 0–44)
AST: 24 U/L (ref 15–41)
Albumin: 4.4 g/dL (ref 3.5–5.0)
Alkaline Phosphatase: 86 U/L (ref 38–126)
Anion gap: 10 (ref 5–15)
BUN: 12 mg/dL (ref 8–23)
CO2: 28 mmol/L (ref 22–32)
Calcium: 9.7 mg/dL (ref 8.9–10.3)
Chloride: 102 mmol/L (ref 98–111)
Creatinine, Ser: 0.7 mg/dL (ref 0.44–1.00)
GFR calc Af Amer: >60 mL/min (ref >60)
GFR calc non Af Amer: >60 mL/min (ref >60)
Glucose, Bld: 107 mg/dL — ABNORMAL HIGH (ref 70–99)
Potassium: 3.4 mmol/L — ABNORMAL LOW (ref 3.5–5.1)
Sodium: 140 mmol/L (ref 135–145)
Total Bilirubin: 0.4 mg/dL (ref 0.3–1.2)
Total Protein: 8 g/dL (ref 6.5–8.1)

## 2019-09-04 LAB — LACTATE DEHYDROGENASE: LDH: 199 U/L — ABNORMAL HIGH (ref 98–192)

## 2019-09-10 ENCOUNTER — Other Ambulatory Visit: Payer: Self-pay

## 2019-09-11 ENCOUNTER — Other Ambulatory Visit (HOSPITAL_COMMUNITY): Payer: Self-pay | Admitting: Nurse Practitioner

## 2019-09-11 ENCOUNTER — Encounter (HOSPITAL_COMMUNITY): Payer: Self-pay | Admitting: Nurse Practitioner

## 2019-09-11 ENCOUNTER — Inpatient Hospital Stay (HOSPITAL_BASED_OUTPATIENT_CLINIC_OR_DEPARTMENT_OTHER): Payer: BLUE CROSS/BLUE SHIELD | Admitting: Nurse Practitioner

## 2019-09-11 DIAGNOSIS — C50911 Malignant neoplasm of unspecified site of right female breast: Secondary | ICD-10-CM

## 2019-09-11 DIAGNOSIS — Z853 Personal history of malignant neoplasm of breast: Secondary | ICD-10-CM | POA: Diagnosis not present

## 2019-09-11 NOTE — Assessment & Plan Note (Addendum)
1.  Stage IA (T1cN0M0) invasive ductal carcinoma the right breast: -Patient was diagnosed in 2017, with triple negative invasive ductal carcinoma. -Post lumpectomy on 09/26/2016 followed by chemotherapy and radiation. -She received Adriamycin/Cytoxan x4 cycles.  Completed 9 of the 12 planned cycles of weekly Taxol; chemo course was complicated by grade 3 peripheral neuropathy and Taxol was subsequently discontinued after cycle 9.  Chemo was completed on 02/21/2017. -She had breast radiation in [place] on 04/26/2017.  She required PRBC transfusion during her radiation therapy for significant anemia. -No role for antiestrogen therapy given the triple negative disease. -Most recent mammogram completed on 10/07/2018 was negative for malignancy. -We will order her mammogram for 2020.  Labs were all within normal limits. -She will follow-up in 1 year with mammogram and labs.  2.  Right breast papilloma: -Patient developed new right nipple discharge and right breast nodule.  She had a small, subcentimeter nodule palpable at the areolar ridge at approximately 10 o'clock position of the right breast. -She underwent diagnostic mammogram with ultrasound on 01/21/2018 that showed single dilated duct in the 3 o'clock retroareolar right breast, containing a 9 x 5 x 3 mm intraductal mass. -Right breast core biopsy done on 02/11/2018 showing benign papilloma. -Dr. Arnoldo Morale today right breast excision on 03/18/2018 showing papilloma with no malignancy. -Bilateral diagnostic mammogram done on 10/07/2018 showed postsurgical changes in the right breast with no evidence of malignancy.

## 2019-09-11 NOTE — Progress Notes (Signed)
 [facility] [address] [place], [place] [address]   CLINIC:  Medical Oncology/Hematology  PCP:  Fanta, Tesfaye, MD [address] [place]  [address] [phone]   REASON FOR VISIT: Follow-up for triple negative breast cancer  CURRENT THERAPY: Surveillance per NCCN guidelines  BRIEF ONCOLOGIC HISTORY:  Oncology History  Invasive ductal carcinoma of breast (HCC)  08/13/2016 Mammogram   BI-RADS CATEGORY  0: Incomplete.   08/21/2016 Mammogram   BI-RADS CATEGORY  5: Highly suggestive of malignancy. Suspicious 1.2 cm mass 10 o'clock position right breast.   08/21/2016 Breast US   Targeted ultrasound is performed, showing a hypoechoic irregular mass with indistinct margins at 10 o'clock position 9 cm from the nipple measuring approximately 1.1 x 1.2 x 1.0 cm. No additional masses are seen in this region of the right breast on ultrasound.   08/28/2016 Procedure   Right needle core biopsy   09/26/2016 Procedure   Right upper outer quadrant lumpectomy by Dr. Jenkins   09/28/2016 Pathology Results   Invasive ductal carcinoma, grade 3, standing 1.7 cm. High-grade ductal carcinoma in situ with comedonecrosis. Invasive carcinoma in less than 0.1 cm of the superior medial margin focally. In situ carcinoma is less than 0.1 cm of superior medial margin and the posterior margin focally. No LVI. ER negative. PR negative. HER-2 negative. KI-67 90%.   09/28/2016 Cancer Staging   Invasive ductal carcinoma of breast (HCC)   Staging form: Breast, AJCC 7th Edition   - Clinical stage from 09/28/2016: Stage IA (T1c, N0, M0) - Signed by Thomas S Kefalas, PA-C on 10/22/2016   09/29/2016 Pathology Results   Invasive ductal carcinoma, grade 2/3. HER-2 negative. ER negative. PR negative. Ki-67 90%.   10/24/2016 Echocardiogram   - Left ventricle: The cavity size was normal. Wall thickness was   normal. Systolic function was normal. The estimated ejection   fraction was in the  range of 60% to 65%. Wall motion was normal;   there were no regional wall motion abnormalities.    10/26/2016 Procedure   Port placed placed by Dr. Jenkins   11/02/2016 -  Chemotherapy   The patient had DOXOrubicin (ADRIAMYCIN) chemo injection 106 mg, 60 mg/m2 = 106 mg, Intravenous,  Once, 4 of 4 cycles  palonosetron (ALOXI) injection 0.25 mg, 0.25 mg, Intravenous,  Once, 4 of 4 cycles  pegfilgrastim (NEULASTA ONPRO KIT) injection 6 mg, 6 mg, Subcutaneous, Once, 4 of 4 cycles  cyclophosphamide (CYTOXAN) 1,060 mg in sodium chloride 0.9 % 250 mL chemo infusion, 600 mg/m2 = 1,060 mg, Intravenous,  Once, 4 of 4 cycles  fosaprepitant (EMEND) 150 mg, dexamethasone (DECADRON) 12 mg in sodium chloride 0.9 % 145 mL IVPB, , Intravenous,  Once, 4 of 4 cycles  PACLitaxel (TAXOL) 138 mg in dextrose 5 % 250 mL chemo infusion ( ondansetron (ZOFRAN) 8 mg in sodium chloride 0.9 % 50 mL IVPB, , Intravenous,  Once, 4 of 12 cycles  for chemotherapy treatment.     03/28/2017 - 04/26/2017 Radiation Therapy   Adjuvant breast radiation ([place]). Right breast 3D CRT 6x photons 16 fractions at 2.65 GY/f to 42.4 Gy TB boost 3 field 6X photons 4 fractions at 2.5 Gy/f to 10 GY           CANCER STAGING: Cancer Staging Invasive ductal carcinoma of breast (HCC) Staging form: Breast, AJCC 7th Edition - Clinical stage from 09/28/2016: Stage IA (T1c, N0, M0) - Signed by Kefalas, Thomas S, PA-C on 10/22/2016      INTERVAL HISTORY:  Kimberly Roman 63 y.o. female returns for routine follow-up for triple negative breast cancer.  Patient reports she has been doing well since her last visit.  She has no issues at this time.  She is due for her mammogram next month. Denies any nausea, vomiting, or diarrhea. Denies any new pains. Had not noticed any recent bleeding such as epistaxis, hematuria or hematochezia. Denies recent chest pain on exertion, shortness of breath on minimal exertion, pre-syncopal episodes, or palpitations.  Denies any numbness or tingling in hands or feet. Denies any recent fevers, infections, or recent hospitalizations. Patient reports appetite at 100% and energy level at 75%.  She is eating well maintain her weight this time.     REVIEW OF SYSTEMS:  Review of Systems  Psychiatric/Behavioral: Positive for sleep disturbance.  All other systems reviewed and are negative.    PAST MEDICAL/SURGICAL HISTORY:  Past Medical History:  Diagnosis Date  . Ankylosing spondylitis ([place])   . Arthritis   . Breast cancer ([place])    right  . Carpal tunnel syndrome   . Environmental allergies   . GERD (gastroesophageal reflux disease)   . Hyperlipemia   . Hypertension   . Invasive ductal carcinoma of breast ([place]) 10/22/2016   Past Surgical History:  Procedure Laterality Date  . ABDOMINAL HYSTERECTOMY    . BREAST BIOPSY Right 03/18/2018   Procedure: RIGHT BREAST BIOPSY;  Surgeon: Aviva Signs, MD;  Location: [facility];  Service: General;  Laterality: Right;  . CARPAL TUNNEL RELEASE Right 04/27/2013   Procedure: RIGHT CARPAL TUNNEL RELEASE;  Surgeon: Carole Civil, MD;  Location: [facility];  Service: Orthopedics;  Laterality: Right;  . COLONOSCOPY N/A 01/20/2013   Procedure: COLONOSCOPY;  Surgeon: Danie Binder, MD;  Location: [facility];  Service: Endoscopy;  Laterality: N/A;  9:30 AM  . HEMATOMA EVACUATION Right 10/26/2016   Procedure: EVACUATION HEMATOMA;  Surgeon: Aviva Signs, MD;  Location: [facility];  Service: General;  Laterality: Right;  . PARTIAL MASTECTOMY WITH AXILLARY SENTINEL LYMPH NODE BIOPSY Right 09/26/2016   Procedure: PARTIAL MASTECTOMY WITH AXILLARY SENTINEL LYMPH NODE BIOPSY;  Surgeon: Aviva Signs, MD;  Location: [facility];  Service: General;  Laterality: Right;  . PORT-A-CATH REMOVAL Left 04/02/2018   Procedure: MINOR REMOVAL PORT-A-CATH;  Surgeon: Aviva Signs, MD;  Location: [facility];  Service: General;  Laterality: Left;  . PORTACATH PLACEMENT Left 10/26/2016   Procedure: INSERTION  PORT-A-CATH;  Surgeon: Aviva Signs, MD;  Location: [facility];  Service: General;  Laterality: Left;     SOCIAL HISTORY:  Social History   Socioeconomic History  . Marital status: Single    Spouse name: Not on file  . Number of children: 0  . Years of education: 74  . Highest education level: Some college, no degree  Occupational History  . Not on file  Social Needs  . Financial resource strain: Not hard at all  . Food insecurity    Worry: Never true    Inability: Never true  . Transportation needs    Medical: No    Non-medical: No  Tobacco Use  . Smoking status: Never Smoker  . Smokeless tobacco: Never Used  Substance and Sexual Activity  . Alcohol use: No  . Drug use: No  . Sexual activity: Not on file  Lifestyle  . Physical activity    Days per week: 7 days    Minutes per session: 30 min  . Stress: Not at all  Relationships  . Social  connections    Talks on phone: More than three times a week    Gets together: Three times a week    Attends religious service: More than 4 times per year    Active member of club or organization: Yes    Attends meetings of clubs or organizations: 1 to 4 times per year    Relationship status: Never married  . Intimate partner violence    Fear of current or ex partner: No    Emotionally abused: No    Physically abused: No    Forced sexual activity: No  Other Topics Concern  . Not on file  Social History Narrative  . Not on file    FAMILY HISTORY:  Family History  Problem Relation Age of Onset  . Arthritis Unknown   . Hypertension Mother   . Hypertension Father   . Colon cancer Neg Hx     CURRENT MEDICATIONS:  Outpatient Encounter Medications as of 09/11/2019  Medication Sig  . amLODipine (NORVASC) 10 MG tablet Take 10 mg by mouth every evening.   . losartan-hydrochlorothiazide (HYZAAR) 50-12.5 MG tablet Take 1 tablet by mouth daily.  . omeprazole (PRILOSEC) 20 MG capsule Take 20 mg by mouth daily before breakfast.   .  acetaminophen (TYLENOL) 650 MG CR tablet Take 650 mg by mouth every 8 (eight) hours as needed for pain.  . Artificial Tear Solution (SOOTHE XP) SOLN Apply 2 drops to eye 3 (three) times daily as needed (for dry/irritated eyes).   . loratadine (CLARITIN) 10 MG tablet Take 10 mg by mouth daily as needed for allergies.  . sodium chloride (OCEAN) 0.65 % SOLN nasal spray Place 1 spray into both nostrils 4 (four) times daily as needed (for dry/congestion/allergies.).  . [DISCONTINUED] prochlorperazine (COMPAZINE) 10 MG tablet Take 1 tablet (10 mg total) by mouth every 6 (six) hours as needed (Nausea or vomiting).   No facility-administered encounter medications on file as of 09/11/2019.     ALLERGIES:  Allergies  Allergen Reactions  . Atorvastatin     Other reaction(s): Other (See Comments) REACTION: Cramping in legs/lower extremties  . Lipitor [Atorvastatin Calcium] Other (See Comments)    REACTION: Cramping in legs/lower extremties     PHYSICAL EXAM:  ECOG Performance status: 1  Vitals:   09/11/19 1121  BP: 130/70  Pulse: 87  Resp: 18  Temp: (!) 96.9 F (36.1 C)  SpO2: 100%   Filed Weights   09/11/19 1121  Weight: 160 lb 11.2 oz (72.9 kg)    Physical Exam Constitutional:      Appearance: Normal appearance. She is normal weight.  Cardiovascular:     Rate and Rhythm: Normal rate and regular rhythm.     Heart sounds: Normal heart sounds.  Pulmonary:     Effort: Pulmonary effort is normal.     Breath sounds: Normal breath sounds.  Abdominal:     General: Bowel sounds are normal.     Palpations: Abdomen is soft.  Musculoskeletal: Normal range of motion.  Skin:    General: Skin is warm.  Neurological:     Mental Status: She is alert and oriented to person, place, and time. Mental status is at baseline.  Psychiatric:        Mood and Affect: Mood normal.        Behavior: Behavior normal.        Thought Content: Thought content normal.        Judgment: Judgment normal.    Breast: RIGHT-lumpectomy scar   well-healed. No palpable masses, no skin changes or nipple discharge, no adenopathy. LEFT- No palpable masses, no skin changes or nipple discharge, no adenopathy.    LABORATORY DATA:  I have reviewed the labs as listed.  CBC    Component Value Date/Time   WBC 9.4 09/04/2019 1318   RBC 4.19 09/04/2019 1318   HGB 12.3 09/04/2019 1318   HCT 38.9 09/04/2019 1318   PLT 298 09/04/2019 1318   MCV 92.8 09/04/2019 1318   MCH 29.4 09/04/2019 1318   MCHC 31.6 09/04/2019 1318   RDW 13.4 09/04/2019 1318   LYMPHSABS 1.5 09/04/2019 1318   MONOABS 0.6 09/04/2019 1318   EOSABS 0.1 09/04/2019 1318   BASOSABS 0.1 09/04/2019 1318   CMP Latest Ref Rng & Units 09/04/2019 10/07/2018 03/12/2018  Glucose 70 - 99 mg/dL 107(H) 107(H) 91  BUN 8 - 23 mg/dL _0 Creatinine 0.44 - 1.00 mg/dL 0.70 0.71 0.65  Sodium 135 - 145 mmol/L 140 140 142  Potassium 3.5 - 5.1 mmol/L 3.4(L) 3.4(L) 3.3(L)  Chloride 98 - 111 mmol/L 102 104 105  CO2 22 - 32 mmol/L _1 Calcium 8.9 - 10.3 mg/dL 9.7 9.5 9.8  Total Protein 6.5 - 8.1 g/dL 8.0 7.8 -  Total Bilirubin 0.3 - 1.2 mg/dL 0.4 0.8 -  Alkaline Phos 38 - 126 U/L 86 114 -  AST 15 - 41 U/L 24 25 -  ALT 0 - 44 U/L 22 28 -    I personally performed a face-to-face visit.  All questions were answered to patient's stated satisfaction. Encouraged patient to call with any new concerns or questions before his next visit to the cancer center and we can certain see him sooner, if needed.     ASSESSMENT & PLAN:   Invasive ductal carcinoma of breast ([place]) 1.  Stage IA (T1cN0M0) invasive ductal carcinoma the right breast: -Patient was diagnosed in 2017, with triple negative invasive ductal carcinoma. -Post lumpectomy on 09/26/2016 followed by chemotherapy and radiation. -She received Adriamycin/Cytoxan x4 cycles.  Completed 9 of the 12 planned cycles of weekly Taxol; chemo course was complicated by grade 3 peripheral neuropathy and Taxol  was subsequently discontinued after cycle 9.  Chemo was completed on 02/21/2017. -She had breast radiation in [place] on 04/26/2017.  She required PRBC transfusion during her radiation therapy for significant anemia. -No role for antiestrogen therapy given the triple negative disease. -Most recent mammogram completed on 10/07/2018 was negative for malignancy. -We will order her mammogram for 2020.  Labs were all within normal limits. -She will follow-up in 1 year with mammogram and labs.  2.  Right breast papilloma: -Patient developed new right nipple discharge and right breast nodule.  She had a small, subcentimeter nodule palpable at the areolar ridge at approximately 10 o'clock position of the right breast. -She underwent diagnostic mammogram with ultrasound on 01/21/2018 that showed single dilated duct in the 3 o'clock retroareolar right breast, containing a 9 x 5 x 3 mm intraductal mass. -Right breast core biopsy done on 02/11/2018 showing benign papilloma. -Dr. Arnoldo Morale today right breast excision on 03/18/2018 showing papilloma with no malignancy. -Bilateral diagnostic mammogram done on 10/07/2018 showed postsurgical changes in the right breast with no evidence of malignancy.       Orders placed this encounter:  Orders Placed This Encounter  Procedures  . MM DIAG BREAST TOMO BILATERAL  . Lactate dehydrogenase  . CBC with Differential/Platelet  . Comprehensive metabolic panel  . Vitamin B12  .  VITAMIN D 25 Hydroxy (Vit-D Deficiency, Fractures)      Francene Finders, FNP-C [place] [phone]

## 2019-09-11 NOTE — Assessment & Plan Note (Deleted)
1.  Stage IA (T1cN0M0) invasive ductal carcinoma the right breast: -Patient was diagnosed in 2017, with triple negative invasive ductal carcinoma.  2.  Right breast papilloma: -Patient developed new right nipple discharge and right breast nodule.  She had a small, subcentimeter nodule palpable at areolar ridge at approximate 10 o'clock position of the right breast. -She underwent a diagnostic mammogram with ultrasound on 01/21/2018 that showed single dilated duct in the 3:00 retroareolar right breast, containing a 9 x 5 x 3 mm intraductal mass. -Right breast core biopsy done on 02/11/2018 showing benign papilloma. -Dr. Arnoldo Morale did right breast excision on 03/18/2018 showing papilloma with no malignancy. -Bilateral diagnostic mammogram done on 10/07/2018 showed postsurgical changes in the right breast with no evidence of malignancy.

## 2019-09-11 NOTE — Patient Instructions (Signed)
[facility] at [facility] Discharge Instructions  Follow up in 1 year with labs and mammogram    Thank you for choosing [facility] at [facility] to provide your oncology and hematology care.  To afford each patient quality time with our provider, please arrive at least 15 minutes before your scheduled appointment time.   If you have a lab appointment with the Cancer Center please come in thru the Main Entrance and check in at the main information desk.  You need to re-schedule your appointment should you arrive 10 or more minutes late.  We strive to give you quality time with our providers, and arriving late affects you and other patients whose appointments are after yours.  Also, if you no show three or more times for appointments you may be dismissed from the clinic at the providers discretion.     Again, thank you for choosing [facility].  Our hope is that these requests will decrease the amount of time that you wait before being seen by our physicians.       _____________________________________________________________  Should you have questions after your visit to [facility], please contact our office at [phone] between the hours of 8:00 a.m. and 4:30 p.m.  Voicemails left after 4:00 p.m. will not be returned until the following business day.  For prescription refill requests, have your pharmacy contact our office and allow 72 hours.    Due to Covid, you will need to wear a mask upon entering the hospital. If you do not have a mask, a mask will be given to you at the Main Entrance upon arrival. For doctor visits, patients may have 1 support person with them. For treatment visits, patients can not have anyone with them due to social distancing guidelines and our immunocompromised population.      

## 2019-10-13 ENCOUNTER — Other Ambulatory Visit (HOSPITAL_COMMUNITY): Payer: Self-pay | Admitting: Nurse Practitioner

## 2019-10-13 ENCOUNTER — Ambulatory Visit (HOSPITAL_COMMUNITY)
Admission: RE | Admit: 2019-10-13 | Discharge: 2019-10-13 | Disposition: A | Discharge status: Home / Self Care | Payer: BLUE CROSS/BLUE SHIELD | Source: Ambulatory Visit | Attending: Nurse Practitioner | Admitting: Nurse Practitioner

## 2019-10-13 ENCOUNTER — Ambulatory Visit (HOSPITAL_COMMUNITY): Admission: RE | Admit: 2019-10-13 | Payer: BLUE CROSS/BLUE SHIELD | Source: Ambulatory Visit

## 2019-10-13 ENCOUNTER — Other Ambulatory Visit: Payer: Self-pay

## 2019-10-13 ENCOUNTER — Ambulatory Visit (HOSPITAL_COMMUNITY): Payer: BLUE CROSS/BLUE SHIELD

## 2019-10-13 DIAGNOSIS — C50911 Malignant neoplasm of unspecified site of right female breast: Secondary | ICD-10-CM | POA: Insufficient documentation

## 2019-10-15 ENCOUNTER — Other Ambulatory Visit (HOSPITAL_COMMUNITY): Payer: Self-pay | Admitting: Nurse Practitioner

## 2019-10-15 DIAGNOSIS — C50911 Malignant neoplasm of unspecified site of right female breast: Secondary | ICD-10-CM

## 2019-10-19 ENCOUNTER — Other Ambulatory Visit (HOSPITAL_COMMUNITY): Payer: Self-pay | Admitting: Nurse Practitioner

## 2019-10-19 DIAGNOSIS — C50911 Malignant neoplasm of unspecified site of right female breast: Secondary | ICD-10-CM

## 2020-02-18 ENCOUNTER — Ambulatory Visit: Payer: BLUE CROSS/BLUE SHIELD | Attending: Internal Medicine

## 2020-02-18 DIAGNOSIS — Z23 Encounter for immunization: Secondary | ICD-10-CM

## 2020-02-18 NOTE — Progress Notes (Signed)
   Covid-19 Vaccination Clinic  Name:  Kimberly Roman    MRN: [id number] DOB: 11-13-1956  02/18/2020  [name] was observed post Covid-19 immunization for 15 minutes without incident. She was provided with Vaccine Information Sheet and instruction to access the V-Safe system.   [name] was instructed to call 911 with any severe reactions post vaccine: Marland Kitchen Difficulty breathing  . Swelling of face and throat  . A fast heartbeat  . A bad rash all over body  . Dizziness and weakness   Immunizations Administered    Name Date Dose VIS Date Route   Moderna COVID-19 Vaccine 02/18/2020  1:26 PM 0.5 mL 10/27/2019 Intramuscular   Manufacturer: Moderna   Lot: [id number]   [id number]

## 2020-03-17 ENCOUNTER — Ambulatory Visit: Payer: BLUE CROSS/BLUE SHIELD | Attending: Internal Medicine

## 2020-03-17 DIAGNOSIS — Z23 Encounter for immunization: Secondary | ICD-10-CM

## 2020-03-17 NOTE — Progress Notes (Signed)
   Covid-19 Vaccination Clinic  Name:  Kimberly Roman    MRN: [id number] DOB: 1956/08/11  03/17/2020  [name] was observed post Covid-19 immunization for 15 minutes without incident. She was provided with Vaccine Information Sheet and instruction to access the V-Safe system.   [name] was instructed to call 911 with any severe reactions post vaccine: Marland Kitchen Difficulty breathing  . Swelling of face and throat  . A fast heartbeat  . A bad rash all over body  . Dizziness and weakness   Immunizations Administered    Name Date Dose VIS Date Route   Moderna COVID-19 Vaccine 03/17/2020  1:15 PM 0.5 mL 10/2019 Intramuscular   Manufacturer: Moderna   Lot: [id number]   [place] StreetPO:[id number]

## 2020-09-14 ENCOUNTER — Other Ambulatory Visit (HOSPITAL_COMMUNITY): Payer: Self-pay | Admitting: Hematology

## 2020-09-14 DIAGNOSIS — C50911 Malignant neoplasm of unspecified site of right female breast: Secondary | ICD-10-CM

## 2020-09-14 DIAGNOSIS — Z9889 Other specified postprocedural states: Secondary | ICD-10-CM

## 2020-10-17 ENCOUNTER — Other Ambulatory Visit (HOSPITAL_COMMUNITY): Payer: Self-pay

## 2020-10-17 DIAGNOSIS — C50911 Malignant neoplasm of unspecified site of right female breast: Secondary | ICD-10-CM

## 2020-10-18 ENCOUNTER — Ambulatory Visit (HOSPITAL_COMMUNITY): Payer: Self-pay

## 2020-10-18 ENCOUNTER — Inpatient Hospital Stay (HOSPITAL_COMMUNITY): Payer: Self-pay | Attending: Hematology

## 2020-10-18 ENCOUNTER — Encounter (HOSPITAL_COMMUNITY): Payer: BLUE CROSS/BLUE SHIELD

## 2020-10-27 ENCOUNTER — Encounter (HOSPITAL_COMMUNITY): Payer: BLUE CROSS/BLUE SHIELD | Admitting: Oncology

## 2020-10-27 NOTE — Progress Notes (Signed)
Error

## 2020-12-06 ENCOUNTER — Other Ambulatory Visit (HOSPITAL_COMMUNITY): Payer: Self-pay | Admitting: Internal Medicine

## 2020-12-06 DIAGNOSIS — Z780 Asymptomatic menopausal state: Secondary | ICD-10-CM

## 2020-12-07 ENCOUNTER — Other Ambulatory Visit (HOSPITAL_COMMUNITY): Payer: Self-pay | Admitting: Oncology

## 2020-12-07 DIAGNOSIS — Z9889 Other specified postprocedural states: Secondary | ICD-10-CM

## 2020-12-08 ENCOUNTER — Ambulatory Visit (HOSPITAL_COMMUNITY)
Admission: RE | Admit: 2020-12-08 | Discharge: 2020-12-08 | Disposition: A | Discharge status: Home / Self Care | Payer: Medicare Other | Source: Ambulatory Visit | Attending: Oncology | Admitting: Oncology

## 2020-12-08 ENCOUNTER — Other Ambulatory Visit: Payer: Self-pay

## 2020-12-08 DIAGNOSIS — Z9011 Acquired absence of right breast and nipple: Secondary | ICD-10-CM | POA: Diagnosis not present

## 2020-12-08 DIAGNOSIS — Z853 Personal history of malignant neoplasm of breast: Secondary | ICD-10-CM | POA: Insufficient documentation

## 2020-12-08 DIAGNOSIS — C50911 Malignant neoplasm of unspecified site of right female breast: Secondary | ICD-10-CM

## 2020-12-08 DIAGNOSIS — Z9889 Other specified postprocedural states: Secondary | ICD-10-CM

## 2021-12-15 ENCOUNTER — Other Ambulatory Visit (HOSPITAL_COMMUNITY): Payer: Self-pay | Admitting: Internal Medicine

## 2021-12-15 DIAGNOSIS — Z1231 Encounter for screening mammogram for malignant neoplasm of breast: Secondary | ICD-10-CM

## 2021-12-20 ENCOUNTER — Other Ambulatory Visit: Payer: Self-pay

## 2021-12-20 ENCOUNTER — Ambulatory Visit (HOSPITAL_COMMUNITY)
Admission: RE | Admit: 2021-12-20 | Discharge: 2021-12-20 | Disposition: A | Discharge status: Home / Self Care | Payer: Medicare Other | Source: Ambulatory Visit | Attending: Internal Medicine | Admitting: Internal Medicine

## 2021-12-20 DIAGNOSIS — Z1231 Encounter for screening mammogram for malignant neoplasm of breast: Secondary | ICD-10-CM | POA: Diagnosis present

## 2022-03-13 ENCOUNTER — Encounter: Payer: Self-pay | Admitting: Orthopaedic Surgery

## 2022-03-13 ENCOUNTER — Ambulatory Visit (INDEPENDENT_AMBULATORY_CARE_PROVIDER_SITE_OTHER): Payer: Medicare Other | Admitting: Orthopaedic Surgery

## 2022-03-13 VITALS — BP 142/85 | HR 94 | Ht 65.0 in | Wt 163.0 lb

## 2022-03-13 DIAGNOSIS — M7061 Trochanteric bursitis, right hip: Secondary | ICD-10-CM | POA: Diagnosis not present

## 2022-03-13 NOTE — Progress Notes (Signed)
? ?Subjective:  ? ? Patient ID: Kimberly Roman, female    DOB: [birth year], 66 y.o.   MRN: [id number] ? ?HPI ?She has had pain in the right hip for about a month laterally getting worse.  It hurts when she lays on it a night.  It has no swelling, no redness.  The pain has become more and more and she asked to be seen today.  I last saw her in 2015. ? ?She has no trauma.  She has no weakness.  Ice, heat, ibuprofen have not helped. ? ? ?Review of Systems  ?Constitutional:  Positive for activity change.  ?Musculoskeletal:  Positive for arthralgias and myalgias.  ?All other systems reviewed and are negative. ?For Review of Systems, all other systems reviewed and are negative. ? ?The following is a summary of the past history medically, past history surgically, known current medicines, social history and family history.  This information is gathered electronically by the computer from prior information and documentation.  I review this each visit and have found including this information at this point in the chart is beneficial and informative.  ? ?Past Medical History:  ?Diagnosis Date  ? Ankylosing spondylitis ([place])   ? Arthritis   ? Breast cancer ([place])   ? right  ? Carpal tunnel syndrome   ? Environmental allergies   ? GERD (gastroesophageal reflux disease)   ? Hyperlipemia   ? Hypertension   ? Invasive ductal carcinoma of breast ([place]) 10/22/2016  ? ? ?Past Surgical History:  ?Procedure Laterality Date  ? ABDOMINAL HYSTERECTOMY    ? BREAST BIOPSY Right 03/18/2018  ? Procedure: RIGHT BREAST BIOPSY;  Surgeon: Aviva Signs, MD;  Location: [facility];  Service: General;  Laterality: Right;  ? CARPAL TUNNEL RELEASE Right 04/27/2013  ? Procedure: RIGHT CARPAL TUNNEL RELEASE;  Surgeon: Carole Civil, MD;  Location: [facility];  Service: Orthopedics;  Laterality: Right;  ? COLONOSCOPY N/A 01/20/2013  ? Procedure: COLONOSCOPY;  Surgeon: Danie Binder, MD;  Location: [facility];  Service: Endoscopy;  Laterality: N/A;  9:30 AM   ? HEMATOMA EVACUATION Right 10/26/2016  ? Procedure: EVACUATION HEMATOMA;  Surgeon: Aviva Signs, MD;  Location: [facility];  Service: General;  Laterality: Right;  ? PARTIAL MASTECTOMY WITH AXILLARY SENTINEL LYMPH NODE BIOPSY Right 09/26/2016  ? Procedure: PARTIAL MASTECTOMY WITH AXILLARY SENTINEL LYMPH NODE BIOPSY;  Surgeon: Aviva Signs, MD;  Location: [facility];  Service: General;  Laterality: Right;  ? PORT-A-CATH REMOVAL Left 04/02/2018  ? Procedure: MINOR REMOVAL PORT-A-CATH;  Surgeon: Aviva Signs, MD;  Location: [facility];  Service: General;  Laterality: Left;  ? PORTACATH PLACEMENT Left 10/26/2016  ? Procedure: INSERTION PORT-A-CATH;  Surgeon: Aviva Signs, MD;  Location: [facility];  Service: General;  Laterality: Left;  ? ? ?Current Outpatient Medications on File Prior to Visit  ?Medication Sig Dispense Refill  ? acetaminophen (TYLENOL) 650 MG CR tablet Take 650 mg by mouth every 8 (eight) hours as needed for pain.    ? amLODipine (NORVASC) 10 MG tablet Take 10 mg by mouth every evening.     ? Artificial Tear Solution (SOOTHE XP) SOLN Apply 2 drops to eye 3 (three) times daily as needed (for dry/irritated eyes).     ? loratadine (CLARITIN) 10 MG tablet Take 10 mg by mouth daily as needed for allergies.    ? losartan-hydrochlorothiazide (HYZAAR) 50-12.5 MG tablet Take 1 tablet by mouth daily.    ? omeprazole (PRILOSEC) 20 MG capsule Take 20 mg by  mouth daily before breakfast.   0  ? sodium chloride (OCEAN) 0.65 % SOLN nasal spray Place 1 spray into both nostrils 4 (four) times daily as needed (for dry/congestion/allergies.).    ? [DISCONTINUED] prochlorperazine (COMPAZINE) 10 MG tablet Take 1 tablet (10 mg total) by mouth every 6 (six) hours as needed (Nausea or vomiting). 30 tablet 1  ? ?No current facility-administered medications on file prior to visit.  ? ? ?Social History  ? ?Socioeconomic History  ? Marital status: Single  ?  Spouse name: Not on file  ? Number of children: 0  ? Years of education: 58  ? Highest  education level: Some college, no degree  ?Occupational History  ? Not on file  ?Tobacco Use  ? Smoking status: Never  ? Smokeless tobacco: Never  ?Vaping Use  ? Vaping Use: Never used  ?Substance and Sexual Activity  ? Alcohol use: No  ? Drug use: No  ? Sexual activity: Not on file  ?Other Topics Concern  ? Not on file  ?Social History Narrative  ? Not on file  ? ?Social Determinants of Health  ? ?Financial Resource Strain: Not on file  ?Food Insecurity: Not on file  ?Transportation Needs: Not on file  ?Physical Activity: Not on file  ?Stress: Not on file  ?Social Connections: Not on file  ?Intimate Partner Violence: Not on file  ? ? ?Family History  ?Problem Relation Age of Onset  ? Arthritis Unknown   ? Hypertension Mother   ? Hypertension Father   ? Colon cancer Neg Hx   ? ? ?BP (!) 142/85   Pulse 94   Ht '5\' 5"'$  (1.651 m)   Wt 163 lb (73.9 kg)   BMI 27.12 kg/m?  ? ?Body mass index is 27.12 kg/m?. ? ?   ?Objective:  ? Physical Exam ?Vitals and nursing note reviewed. Exam conducted with a chaperone present.  ?Constitutional:   ?   Appearance: She is well-developed.  ?HENT:  ?   Head: Normocephalic and atraumatic.  ?Eyes:  ?   Conjunctiva/sclera: Conjunctivae normal.  ?   Pupils: Pupils are equal, round, and reactive to light.  ?Cardiovascular:  ?   Rate and Rhythm: Normal rate and regular rhythm.  ?Pulmonary:  ?   Effort: Pulmonary effort is normal.  ?Abdominal:  ?   Palpations: Abdomen is soft.  ?Musculoskeletal:  ?   Cervical back: Normal range of motion and neck supple.  ?     Legs: ? ?Skin: ?   General: Skin is warm and dry.  ?Neurological:  ?   Mental Status: She is alert and oriented to person, place, and time.  ?   Cranial Nerves: No cranial nerve deficit.  ?   Motor: No abnormal muscle tone.  ?   Coordination: Coordination normal.  ?   Deep Tendon Reflexes: Reflexes are normal and symmetric. Reflexes normal.  ?Psychiatric:     ?   Behavior: Behavior normal.     ?   Thought Content: Thought content  normal.     ?   Judgment: Judgment normal.  ? ? ? ? ? ?   ?Assessment & Plan:  ? ?Encounter Diagnosis  ?Name Primary?  ? Trochanteric bursitis, right hip Yes  ? ?PROCEDURE NOTE: ? ?The patient request injection, verbal consent was obtained. ? ?The right trochanteric area of the hip was prepped appropriately after time out was performed.  ? ?Sterile technique was observed and injection of 1 cc of DepoMedrol 40 mg  with several cc's of plain xylocaine. Anesthesia was provided by ethyl chloride and a 20-gauge needle was used to inject the hip area. The injection was tolerated well. ? ?A band aid dressing was applied. ? ?The patient was advised to apply ice later today and tomorrow to the injection sight as needed. ? ?Return in two weeks. ? ?Call if any problem. ? ?Precautions discussed. ? ?Electronically Signed ?Sanjuana Kava, MD ?4/18/20233:05 PM ? ? ?

## 2022-03-15 ENCOUNTER — Ambulatory Visit: Payer: Medicare Other | Admitting: Orthopaedic Surgery

## 2022-03-27 ENCOUNTER — Ambulatory Visit (INDEPENDENT_AMBULATORY_CARE_PROVIDER_SITE_OTHER): Payer: Medicare Other | Admitting: Orthopaedic Surgery

## 2022-03-27 ENCOUNTER — Encounter: Payer: Self-pay | Admitting: Orthopaedic Surgery

## 2022-03-27 VITALS — Ht 65.0 in | Wt 163.0 lb

## 2022-03-27 DIAGNOSIS — M7061 Trochanteric bursitis, right hip: Secondary | ICD-10-CM

## 2022-03-27 NOTE — Progress Notes (Signed)
I feel good. ? ?The injection into the right hip bursa laterally did well. She has no pain now. She is walking well. ? ?Right hip has no pain, full ROM, no limp, NV intact. ? ?Encounter Diagnosis  ?Name Primary?  ? Trochanteric bursitis, right hip Yes  ? ?I will see prn. ? ?Call if any problem. ? ?Precautions discussed. ? ?Electronically Signed ?Sanjuana Kava, MD ?5/2/20231:57 PM ? ?

## 2022-12-10 ENCOUNTER — Other Ambulatory Visit (HOSPITAL_COMMUNITY): Payer: Self-pay | Admitting: Internal Medicine

## 2022-12-10 DIAGNOSIS — Z1231 Encounter for screening mammogram for malignant neoplasm of breast: Secondary | ICD-10-CM

## 2022-12-14 ENCOUNTER — Encounter: Payer: Self-pay | Admitting: *Deleted

## 2022-12-24 ENCOUNTER — Ambulatory Visit (HOSPITAL_COMMUNITY)
Admission: RE | Admit: 2022-12-24 | Discharge: 2022-12-24 | Disposition: A | Discharge status: Home / Self Care | Payer: Medicare Other | Source: Ambulatory Visit | Attending: Internal Medicine | Admitting: Internal Medicine

## 2022-12-24 DIAGNOSIS — Z1231 Encounter for screening mammogram for malignant neoplasm of breast: Secondary | ICD-10-CM | POA: Diagnosis not present

## 2023-12-02 ENCOUNTER — Other Ambulatory Visit (HOSPITAL_COMMUNITY): Payer: Self-pay | Admitting: Gerontology

## 2023-12-02 ENCOUNTER — Other Ambulatory Visit (HOSPITAL_COMMUNITY): Payer: Self-pay | Admitting: Internal Medicine

## 2023-12-02 ENCOUNTER — Ambulatory Visit (HOSPITAL_COMMUNITY): Payer: Medicare Other

## 2023-12-02 DIAGNOSIS — Z1231 Encounter for screening mammogram for malignant neoplasm of breast: Secondary | ICD-10-CM

## 2023-12-27 ENCOUNTER — Ambulatory Visit (HOSPITAL_COMMUNITY)
Admission: RE | Admit: 2023-12-27 | Discharge: 2023-12-27 | Disposition: A | Discharge status: Home / Self Care | Payer: Medicare Other | Source: Ambulatory Visit | Attending: Internal Medicine | Admitting: Internal Medicine

## 2023-12-27 ENCOUNTER — Encounter (HOSPITAL_COMMUNITY): Payer: Self-pay

## 2023-12-27 ENCOUNTER — Ambulatory Visit (HOSPITAL_COMMUNITY): Payer: Medicare Other

## 2023-12-27 DIAGNOSIS — Z1231 Encounter for screening mammogram for malignant neoplasm of breast: Secondary | ICD-10-CM | POA: Insufficient documentation

## 2024-04-23 ENCOUNTER — Encounter (INDEPENDENT_AMBULATORY_CARE_PROVIDER_SITE_OTHER): Payer: Self-pay | Admitting: *Deleted

## 2024-10-26 ENCOUNTER — Encounter (INDEPENDENT_AMBULATORY_CARE_PROVIDER_SITE_OTHER): Payer: Self-pay | Admitting: *Deleted

## 2024-12-07 ENCOUNTER — Other Ambulatory Visit (HOSPITAL_COMMUNITY): Payer: Self-pay | Admitting: Internal Medicine

## 2024-12-07 DIAGNOSIS — Z1231 Encounter for screening mammogram for malignant neoplasm of breast: Secondary | ICD-10-CM

## 2024-12-28 ENCOUNTER — Ambulatory Visit (HOSPITAL_COMMUNITY)

## 2025-01-01 ENCOUNTER — Inpatient Hospital Stay (HOSPITAL_COMMUNITY): Admission: RE | Admit: 2025-01-01

## 2025-01-01 ENCOUNTER — Encounter (HOSPITAL_COMMUNITY): Payer: Self-pay

## 2025-01-01 DIAGNOSIS — Z1231 Encounter for screening mammogram for malignant neoplasm of breast: Secondary | ICD-10-CM
